# Patient Record
Sex: Female | Born: 1990 | Race: White | Hispanic: No | Marital: Married | Smoking: Never smoker
Health system: Southern US, Community
[De-identification: ages and names within clinical notes are randomized; demographics above are authoritative.]

## PROBLEM LIST (undated history)

## (undated) ENCOUNTER — Inpatient Hospital Stay (HOSPITAL_COMMUNITY): Payer: Self-pay

## (undated) DIAGNOSIS — R42 Dizziness and giddiness: Secondary | ICD-10-CM

## (undated) DIAGNOSIS — F909 Attention-deficit hyperactivity disorder, unspecified type: Secondary | ICD-10-CM

## (undated) DIAGNOSIS — J3089 Other allergic rhinitis: Secondary | ICD-10-CM

## (undated) DIAGNOSIS — J45909 Unspecified asthma, uncomplicated: Secondary | ICD-10-CM

## (undated) DIAGNOSIS — IMO0002 Reserved for concepts with insufficient information to code with codable children: Secondary | ICD-10-CM

## (undated) DIAGNOSIS — Z3490 Encounter for supervision of normal pregnancy, unspecified, unspecified trimester: Secondary | ICD-10-CM

## (undated) DIAGNOSIS — F32A Depression, unspecified: Secondary | ICD-10-CM

## (undated) DIAGNOSIS — R12 Heartburn: Secondary | ICD-10-CM

## (undated) DIAGNOSIS — F329 Major depressive disorder, single episode, unspecified: Secondary | ICD-10-CM

## (undated) DIAGNOSIS — B009 Herpesviral infection, unspecified: Secondary | ICD-10-CM

## (undated) HISTORY — DX: Other allergic rhinitis: J30.89

## (undated) HISTORY — DX: Dizziness and giddiness: R42

## (undated) HISTORY — DX: Attention-deficit hyperactivity disorder, unspecified type: F90.9

## (undated) HISTORY — DX: Heartburn: R12

## (undated) HISTORY — DX: Encounter for supervision of normal pregnancy, unspecified, unspecified trimester: Z34.90

---
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
## 2009-02-02 ENCOUNTER — Inpatient Hospital Stay (HOSPITAL_COMMUNITY): Admission: AD | Admit: 2009-02-02 | Discharge: 2009-02-03 | Payer: Self-pay | Admitting: Obstetrics & Gynecology

## 2009-08-13 ENCOUNTER — Inpatient Hospital Stay (HOSPITAL_COMMUNITY): Admission: AD | Admit: 2009-08-13 | Discharge: 2009-08-13 | Payer: Self-pay | Admitting: Obstetrics & Gynecology

## 2009-08-13 ENCOUNTER — Inpatient Hospital Stay (HOSPITAL_COMMUNITY): Admission: AD | Admit: 2009-08-13 | Discharge: 2009-08-15 | Payer: Self-pay | Admitting: Obstetrics & Gynecology

## 2009-08-13 ENCOUNTER — Ambulatory Visit: Payer: Self-pay | Admitting: Family

## 2009-11-21 HISTORY — PX: MYRINGOTOMY: SHX2060

## 2009-11-21 HISTORY — PX: INTRAUTERINE DEVICE INSERTION: SHX323

## 2010-01-29 ENCOUNTER — Emergency Department (HOSPITAL_COMMUNITY): Admission: EM | Admit: 2010-01-29 | Discharge: 2010-01-29 | Payer: Self-pay | Admitting: Emergency Medicine

## 2010-04-28 ENCOUNTER — Ambulatory Visit (HOSPITAL_COMMUNITY): Admission: RE | Admit: 2010-04-28 | Discharge: 2010-04-28 | Payer: Self-pay | Admitting: Family Medicine

## 2010-08-03 ENCOUNTER — Ambulatory Visit (HOSPITAL_COMMUNITY): Admission: RE | Admit: 2010-08-03 | Discharge: 2010-08-03 | Payer: Self-pay | Admitting: Orthopedic Surgery

## 2010-09-28 ENCOUNTER — Ambulatory Visit (HOSPITAL_COMMUNITY): Admission: RE | Admit: 2010-09-28 | Discharge: 2010-09-28 | Payer: Self-pay | Admitting: Otolaryngology

## 2010-10-30 ENCOUNTER — Emergency Department (HOSPITAL_COMMUNITY)
Admission: EM | Admit: 2010-10-30 | Discharge: 2010-10-30 | Payer: Self-pay | Source: Home / Self Care | Admitting: Emergency Medicine

## 2011-01-31 LAB — URINALYSIS, ROUTINE W REFLEX MICROSCOPIC
Bilirubin Urine: NEGATIVE
Glucose, UA: NEGATIVE mg/dL
Hgb urine dipstick: NEGATIVE
Specific Gravity, Urine: 1.015 (ref 1.005–1.030)
pH: 8.5 — ABNORMAL HIGH (ref 5.0–8.0)

## 2011-02-13 LAB — CBC
Hemoglobin: 15.5 g/dL — ABNORMAL HIGH (ref 12.0–15.0)
MCHC: 34.5 g/dL (ref 30.0–36.0)
Platelets: 251 10*3/uL (ref 150–400)
RBC: 5.3 MIL/uL — ABNORMAL HIGH (ref 3.87–5.11)
RDW: 12.3 % (ref 11.5–15.5)
WBC: 8 10*3/uL (ref 4.0–10.5)

## 2011-02-13 LAB — DIFFERENTIAL
Basophils Absolute: 0 10*3/uL (ref 0.0–0.1)
Basophils Relative: 0 % (ref 0–1)
Eosinophils Relative: 2 % (ref 0–5)
Lymphocytes Relative: 21 % (ref 12–46)
Monocytes Absolute: 0.5 10*3/uL (ref 0.1–1.0)
Neutro Abs: 5.6 10*3/uL (ref 1.7–7.7)

## 2011-02-13 LAB — BASIC METABOLIC PANEL
Chloride: 102 mEq/L (ref 96–112)
Creatinine, Ser: 0.69 mg/dL (ref 0.4–1.2)
GFR calc Af Amer: >60 mL/min (ref >60)
Potassium: 3.4 mEq/L — ABNORMAL LOW (ref 3.5–5.1)
Sodium: 134 mEq/L — ABNORMAL LOW (ref 135–145)

## 2011-02-13 LAB — RAPID URINE DRUG SCREEN, HOSP PERFORMED
Cocaine: NOT DETECTED
Tetrahydrocannabinol: NOT DETECTED

## 2011-02-13 LAB — ETHANOL: Alcohol, Ethyl (B): <5 mg/dL (ref 0–10)

## 2011-02-13 LAB — PREGNANCY, URINE: Preg Test, Ur: NEGATIVE

## 2011-02-22 ENCOUNTER — Emergency Department (HOSPITAL_COMMUNITY)
Admission: EM | Admit: 2011-02-22 | Discharge: 2011-02-23 | Disposition: A | Discharge status: Home / Self Care | Payer: Medicaid Other | Attending: Emergency Medicine | Admitting: Emergency Medicine

## 2011-02-22 DIAGNOSIS — F3289 Other specified depressive episodes: Secondary | ICD-10-CM | POA: Insufficient documentation

## 2011-02-22 DIAGNOSIS — T23219A Burn of second degree of unspecified thumb (nail), initial encounter: Secondary | ICD-10-CM | POA: Insufficient documentation

## 2011-02-22 DIAGNOSIS — X12XXXA Contact with other hot fluids, initial encounter: Secondary | ICD-10-CM | POA: Insufficient documentation

## 2011-02-22 DIAGNOSIS — Z79899 Other long term (current) drug therapy: Secondary | ICD-10-CM | POA: Insufficient documentation

## 2011-02-22 DIAGNOSIS — Y93G1 Activity, food preparation and clean up: Secondary | ICD-10-CM | POA: Insufficient documentation

## 2011-02-22 DIAGNOSIS — Y92009 Unspecified place in unspecified non-institutional (private) residence as the place of occurrence of the external cause: Secondary | ICD-10-CM | POA: Insufficient documentation

## 2011-02-22 DIAGNOSIS — Y998 Other external cause status: Secondary | ICD-10-CM | POA: Insufficient documentation

## 2011-02-22 DIAGNOSIS — T22219A Burn of second degree of unspecified forearm, initial encounter: Secondary | ICD-10-CM | POA: Insufficient documentation

## 2011-02-22 DIAGNOSIS — F329 Major depressive disorder, single episode, unspecified: Secondary | ICD-10-CM | POA: Insufficient documentation

## 2011-02-22 DIAGNOSIS — T310 Burns involving less than 10% of body surface: Secondary | ICD-10-CM | POA: Insufficient documentation

## 2011-02-25 LAB — CBC
HCT: 30.7 % — ABNORMAL LOW (ref 36.0–46.0)
HCT: 36 % (ref 36.0–46.0)
Hemoglobin: 10.4 g/dL — ABNORMAL LOW (ref 12.0–15.0)
Hemoglobin: 12.1 g/dL (ref 12.0–15.0)
MCHC: 33.8 g/dL (ref 30.0–36.0)
MCV: 86.8 fL (ref 78.0–100.0)
MCV: 86.8 fL (ref 78.0–100.0)
RBC: 3.54 MIL/uL — ABNORMAL LOW (ref 3.87–5.11)
RDW: 13.1 % (ref 11.5–15.5)
WBC: 12.1 10*3/uL — ABNORMAL HIGH (ref 4.0–10.5)

## 2011-02-25 LAB — RH IMMUNE GLOB WKUP(>/=20WKS)(NOT WOMEN'S HOSP)

## 2011-03-03 LAB — URINALYSIS, ROUTINE W REFLEX MICROSCOPIC
Bilirubin Urine: NEGATIVE
Nitrite: NEGATIVE
Specific Gravity, Urine: >1.03 — ABNORMAL HIGH (ref 1.005–1.030)
Urobilinogen, UA: 0.2 mg/dL (ref 0.0–1.0)
pH: 5.5 (ref 5.0–8.0)

## 2011-03-07 LAB — CBC
Hemoglobin: 14.9 g/dL (ref 12.0–15.0)
MCHC: 33.5 g/dL (ref 30.0–36.0)
MCHC: 33.7 g/dL (ref 30.0–36.0)
MCV: 89.2 fL (ref 78.0–100.0)
Platelets: 270 10*3/uL (ref 150–400)
RBC: 4.55 MIL/uL (ref 3.87–5.11)
RDW: 12.3 % (ref 11.5–15.5)
RDW: 11.9 % (ref 11.5–15.5)

## 2011-03-07 LAB — PREGNANCY, URINE: Preg Test, Ur: POSITIVE

## 2011-03-07 LAB — RH IMMUNE GLOBULIN WORKUP (NOT WOMEN'S HOSP): Antibody Screen: NEGATIVE

## 2011-03-07 LAB — URINALYSIS, ROUTINE W REFLEX MICROSCOPIC
Glucose, UA: NEGATIVE mg/dL
Ketones, ur: NEGATIVE mg/dL
Protein, ur: NEGATIVE mg/dL
Urobilinogen, UA: 0.2 mg/dL (ref 0.0–1.0)

## 2011-03-07 LAB — HCG, QUANTITATIVE, PREGNANCY
hCG, Beta Chain, Quant, S: 21380 m[IU]/mL — ABNORMAL HIGH (ref <5)
hCG, Beta Chain, Quant, S: 42544 m[IU]/mL — ABNORMAL HIGH (ref <5)

## 2011-03-07 LAB — DIFFERENTIAL
Basophils Absolute: 0.1 10*3/uL (ref 0.0–0.1)
Basophils Relative: 1 % (ref 0–1)
Eosinophils Relative: 2 % (ref 0–5)
Monocytes Absolute: 0.7 10*3/uL (ref 0.1–1.0)
Neutro Abs: 4.5 10*3/uL (ref 1.7–7.7)

## 2011-03-07 LAB — GC/CHLAMYDIA PROBE AMP, GENITAL
Chlamydia, DNA Probe: NEGATIVE
GC Probe Amp, Genital: NEGATIVE

## 2011-03-07 LAB — WET PREP, GENITAL
Trich, Wet Prep: NONE SEEN
Yeast Wet Prep HPF POC: NONE SEEN

## 2012-01-23 ENCOUNTER — Encounter (HOSPITAL_COMMUNITY): Payer: Self-pay | Admitting: Emergency Medicine

## 2012-01-23 ENCOUNTER — Emergency Department (HOSPITAL_COMMUNITY)
Admission: EM | Admit: 2012-01-23 | Discharge: 2012-01-23 | Disposition: A | Discharge status: Home / Self Care | Payer: Medicaid Other | Attending: Emergency Medicine | Admitting: Emergency Medicine

## 2012-01-23 ENCOUNTER — Emergency Department (HOSPITAL_COMMUNITY): Payer: Medicaid Other

## 2012-01-23 DIAGNOSIS — R112 Nausea with vomiting, unspecified: Secondary | ICD-10-CM | POA: Insufficient documentation

## 2012-01-23 DIAGNOSIS — R109 Unspecified abdominal pain: Secondary | ICD-10-CM | POA: Insufficient documentation

## 2012-01-23 LAB — DIFFERENTIAL
Eosinophils Relative: 0 % (ref 0–5)
Lymphocytes Relative: 6 % — ABNORMAL LOW (ref 12–46)
Lymphs Abs: 0.5 10*3/uL — ABNORMAL LOW (ref 0.7–4.0)
Monocytes Absolute: 0.4 10*3/uL (ref 0.1–1.0)

## 2012-01-23 LAB — COMPREHENSIVE METABOLIC PANEL
BUN: 9 mg/dL (ref 6–23)
CO2: 27 mEq/L (ref 19–32)
Calcium: 9.3 mg/dL (ref 8.4–10.5)
Creatinine, Ser: 0.66 mg/dL (ref 0.50–1.10)
GFR calc Af Amer: >90 mL/min (ref >90)
GFR calc non Af Amer: >90 mL/min (ref >90)
Glucose, Bld: 92 mg/dL (ref 70–99)

## 2012-01-23 LAB — URINALYSIS, ROUTINE W REFLEX MICROSCOPIC
Bilirubin Urine: NEGATIVE
Ketones, ur: NEGATIVE mg/dL
Nitrite: NEGATIVE
pH: 6 (ref 5.0–8.0)

## 2012-01-23 LAB — CBC
HCT: 44.2 % (ref 36.0–46.0)
MCV: 85.3 fL (ref 78.0–100.0)
RBC: 5.18 MIL/uL — ABNORMAL HIGH (ref 3.87–5.11)
RDW: 12.1 % (ref 11.5–15.5)
WBC: 8 10*3/uL (ref 4.0–10.5)

## 2012-01-23 MED ORDER — GI COCKTAIL ~~LOC~~
30.0000 mL | Freq: Once | ORAL | Status: AC
  Administered 2012-01-23: 30 mL via ORAL
  Filled 2012-01-23: qty 30

## 2012-01-23 MED ORDER — KETOROLAC TROMETHAMINE 30 MG/ML IJ SOLN
30.0000 mg | Freq: Once | INTRAMUSCULAR | Status: AC
  Administered 2012-01-23: 30 mg via INTRAVENOUS
  Filled 2012-01-23: qty 1

## 2012-01-23 MED ORDER — PROMETHAZINE HCL 25 MG PO TABS: 25.0000 mg | ORAL_TABLET | Freq: Four times a day (QID) | ORAL | Status: DC | PRN

## 2012-01-23 MED ORDER — FAMOTIDINE IN NACL 20-0.9 MG/50ML-% IV SOLN
20.0000 mg | Freq: Once | INTRAVENOUS | Status: AC
  Administered 2012-01-23: 20 mg via INTRAVENOUS
  Filled 2012-01-23: qty 50

## 2012-01-23 MED ORDER — SODIUM CHLORIDE 0.9 % IV BOLUS (SEPSIS)
1000.0000 mL | Freq: Once | INTRAVENOUS | Status: AC
  Administered 2012-01-23: 1000 mL via INTRAVENOUS

## 2012-01-23 MED ORDER — PROMETHAZINE HCL 25 MG/ML IJ SOLN
12.5000 mg | Freq: Once | INTRAMUSCULAR | Status: AC
  Administered 2012-01-23: 25 mg via INTRAVENOUS
  Filled 2012-01-23: qty 1

## 2012-01-23 MED ORDER — DICYCLOMINE HCL 20 MG PO TABS: 20.0000 mg | ORAL_TABLET | Freq: Four times a day (QID) | ORAL | Status: DC | PRN

## 2012-01-23 MED ORDER — ONDANSETRON 8 MG PO TBDP
8.0000 mg | ORAL_TABLET | Freq: Once | ORAL | Status: DC
  Filled 2012-01-23: qty 1

## 2012-01-23 NOTE — ED Provider Notes (Signed)
History     CSN: [id number]  Arrival date & time 01/23/12  1236   First MD Initiated Contact with Patient 01/23/12 1317      Chief Complaint  Patient presents with  . Nausea  . Emesis  . Abdominal Pain     HPI Pt was seen at 1340.  Per pt, c/o gradual onset and persistence of constant upper abd "pain" for the past several years, worse over the past several days.  Describes the pain as "burning."  Has been assoc with multiple intermittent episodes of N/V as well as alternating diarrhea and constipation.  Has not been eval by her PMD or GI for same.  Denies back pain, no fevers, no rash, no CP/SOB, no cough, no black or blood in stools or emesis.       History reviewed. No pertinent past medical history.  History reviewed. No pertinent past surgical history.  History  Substance Use Topics  . Smoking status: Not on file  . Smokeless tobacco: Not on file  . Alcohol Use: Yes    Review of Systems ROS: Statement: All systems negative except as marked or noted in the HPI; Constitutional: Negative for fever and chills. ; ; Eyes: Negative for eye pain, redness and discharge. ; ; ENMT: Negative for ear pain, hoarseness, nasal congestion, sinus pressure and sore throat. ; ; Cardiovascular: Negative for chest pain, palpitations, diaphoresis, dyspnea and peripheral edema. ; ; Respiratory: Negative for cough, wheezing and stridor. ; ; Gastrointestinal: +N/V/D, constipation, abd pain. Negative for blood in stool, hematemesis, jaundice and rectal bleeding. . ; ; Genitourinary: Negative for dysuria, flank pain and hematuria. ; ; Musculoskeletal: Negative for back pain and neck pain. Negative for swelling and trauma.; ; Skin: Negative for pruritus, rash, abrasions, blisters, bruising and skin lesion.; ; Neuro: Negative for headache, lightheadedness and neck stiffness. Negative for weakness, altered level of consciousness , altered mental status, extremity weakness, paresthesias, involuntary movement,  seizure and syncope.     Allergies  Latex  Home Medications  No current outpatient prescriptions on file.  BP 119/78  Pulse 120  Temp(Src) 98.5 F (36.9 C) (Oral)  Resp 20  Ht 5\' 4"  (1.626 m)  Wt 162 lb (73.483 kg)  BMI 27.81 kg/m2  SpO2 100%  LMP 12/05/2011  Physical Exam 1345: Physical examination:  Nursing notes reviewed; Vital signs and O2 SAT reviewed;  Constitutional: Well developed, Well nourished, Well hydrated, In no acute distress; Head:  Normocephalic, atraumatic; Eyes: EOMI, PERRL, No scleral icterus; ENMT: Mouth and pharynx normal, Mucous membranes moist; Neck: Supple, Full range of motion, No lymphadenopathy; Cardiovascular: Regular rate and rhythm, No murmur, rub, or gallop; Respiratory: Breath sounds clear & equal bilaterally, No rales, rhonchi, wheezes, or rub, Normal respiratory effort/excursion; Chest: Nontender, Movement normal; Abdomen: Soft, +mid-epigastric area tender to palp, no rebound or guarding, Nondistended, Normal bowel sounds; Genitourinary: No CVA tenderness; Extremities: Pulses normal, No tenderness, No edema, No calf edema or asymmetry.; Neuro: AA&Ox3, Major CN grossly intact.  No gross focal motor or sensory deficits in extremities.; Skin: Color normal, Warm, Dry, no rash.    ED Course  Procedures   MDM  MDM Reviewed: nursing note and vitals Interpretation: labs and ultrasound     Results for orders placed during the hospital encounter of 01/23/12  URINALYSIS, ROUTINE W REFLEX MICROSCOPIC      Component Value Range   Color, Urine YELLOW  YELLOW    APPearance CLEAR  CLEAR    Specific Gravity, Urine 1.025  1.005 - 1.030    pH 6.0  5.0 - 8.0    Glucose, UA NEGATIVE  NEGATIVE (mg/dL)   Hgb urine dipstick NEGATIVE  NEGATIVE    Bilirubin Urine NEGATIVE  NEGATIVE    Ketones, ur NEGATIVE  NEGATIVE (mg/dL)   Protein, ur NEGATIVE  NEGATIVE (mg/dL)   Urobilinogen, UA 1.0  0.0 - 1.0 (mg/dL)   Nitrite NEGATIVE  NEGATIVE    Leukocytes, UA NEGATIVE   NEGATIVE   POCT PREGNANCY, URINE      Component Value Range   Preg Test, Ur NEGATIVE  NEGATIVE   LIPASE, BLOOD      Component Value Range   Lipase 18  11 - 59 (U/L)  CBC      Component Value Range   WBC 8.0  4.0 - 10.5 (K/uL)   RBC 5.18 (*) 3.87 - 5.11 (MIL/uL)   Hemoglobin 15.4 (*) 12.0 - 15.0 (g/dL)   HCT 62.1  30.8 - 65.7 (%)   MCV 85.3  78.0 - 100.0 (fL)   MCH 29.7  26.0 - 34.0 (pg)   MCHC 34.8  30.0 - 36.0 (g/dL)   RDW 84.6  96.2 - 95.2 (%)   Platelets 201  150 - 400 (K/uL)  DIFFERENTIAL      Component Value Range   Neutrophils Relative 89 (*) 43 - 77 (%)   Neutro Abs 7.1  1.7 - 7.7 (K/uL)   Lymphocytes Relative 6 (*) 12 - 46 (%)   Lymphs Abs 0.5 (*) 0.7 - 4.0 (K/uL)   Monocytes Relative 5  3 - 12 (%)   Monocytes Absolute 0.4  0.1 - 1.0 (K/uL)   Eosinophils Relative 0  0 - 5 (%)   Eosinophils Absolute 0.0  0.0 - 0.7 (K/uL)   Basophils Relative 0  0 - 1 (%)   Basophils Absolute 0.0  0.0 - 0.1 (K/uL)  COMPREHENSIVE METABOLIC PANEL      Component Value Range   Sodium 136  135 - 145 (mEq/L)   Potassium 3.6  3.5 - 5.1 (mEq/L)   Chloride 101  96 - 112 (mEq/L)   CO2 27  19 - 32 (mEq/L)   Glucose, Bld 92  70 - 99 (mg/dL)   BUN 9  6 - 23 (mg/dL)   Creatinine, Ser 8.41  0.50 - 1.10 (mg/dL)   Calcium 9.3  8.4 - 32.4 (mg/dL)   Total Protein 7.4  6.0 - 8.3 (g/dL)   Albumin 4.0  3.5 - 5.2 (g/dL)   AST 15  0 - 37 (U/L)   ALT 11  0 - 35 (U/L)   Alkaline Phosphatase 54  39 - 117 (U/L)   Total Bilirubin 0.8  0.3 - 1.2 (mg/dL)   GFR calc non Af Amer >90  >90 (mL/min)   GFR calc Af Amer >90  >90 (mL/min)    US Abdomen Complete 01/23/2012  *RADIOLOGY REPORT*  Clinical Data: Abdominal pain.  Nausea vomiting.  ABDOMEN ULTRASOUND  Technique:  Complete abdominal ultrasound examination was performed including evaluation of the liver, gallbladder, bile ducts, pancreas, kidneys, spleen, IVC, and abdominal aorta.  Comparison: No comparison studies available.  Findings:  Gallbladder:  There  is no evidence for gallstones.  No gallbladder wall thickening or pericholecystic fluid.  The sonographer reports no sonographic Murphy's sign.  Common Bile Duct:  Measures up to 8 mm in diameter.  Liver:  Normal.  No focal parenchymal abnormality.  No biliary dilation.  IVC:  Normal.  Pancreas:  The pancreatic tail  obscured by overlying bowel gas. Otherwise unremarkable.  Spleen:  Normal.  Right kidney:  9.4 cm in long axis.  Normal.  Left kidney:  10.1 cm in long axis.  Normal.  Abdominal Aorta:  No aneurysm.  IMPRESSION: Mild distention of the extrahepatic common duct without evidence for intrahepatic biliary duct dilatation and a normal appearing gallbladder.  Consider correlation with liver function tests.  Original Report Authenticated By: ERIC A. MANSELL, M.D.     4:52 PM:  LFT's and WBC normal, afebrile with stable VSS. Pt requesting phenergan instead of zofran "because that stuff doesn't work for me."  Has taken a nap, tol PO food and fluids well while in the ED without N/V.  Has not had BM while in ED.  Appears NAD, non-toxic appearing, VSS, resps easy.  Wants to go home now.  Dx testing d/w pt and family.  Questions answered.  Verb understanding, agreeable to d/c home with outpt f/u with PMD and GI MD.          Laray Anger, DO 01/25/12 1545

## 2012-01-23 NOTE — Discharge Instructions (Signed)
RESOURCE GUIDE  Dental Problems  Patients with Medicaid: [facility]                     [facility] [address]                                           [address] Phone:  [phone]                                                  Phone:  [phone]  If unable to pay or uninsured, contact:  [facility] or [facility] to become qualified for the adult dental clinic.  Chronic Pain Problems Contact [facility]  [phone] Patients need to be referred by their primary care doctor.  Insufficient Money for Medicine Contact [employer]:  call "211" or [facility] 271-5999.  No Primary Care Doctor Call [facility]  [phone] Other agencies that provide inexpensive medical care    [facility]  [phone]    [facility]  [phone]    [facility]  [phone]    Women[facility] Clinic  [phone]    [facility]  [phone]    [facility]  [phone]  Psychological Services [facility]  [phone] [facility]  [phone] [facility]   [phone] (emergency services [phone])  Substance Abuse Resources [facility]  336-882-2125 [facility] 336-784-9470 [facility] 336-285-9073 [facility] 336-845-3988 Residential & Outpatient Substance Abuse Program  800-659-3381  Abuse/Neglect [place] Child Abuse Hotline (336) 641-3795 [place] Child Abuse Hotline 800-378-5315 (After Hours)  Emergency Shelter [facility] (336) 271-5985  Maternity Homes [facility] (336) 275-9566 [facility] (704) 372-4663  MRSA Hotline #:   832-7006    [place] [facility]  [facility] [place] [facility]     [employer]                          [facility] [address] [place]                       [address]      [address]  [place]                                                [place]                            [place] Phone:  [phone]                                   Phone:  [phone]                 Phone:  [phone]  [facility] Phone:  [phone]    [facility] Child Abuse Hotline (309)116-9884 (571)290-9461 (After Hours)    Take the prescription as directed.  Increase your fluid intake (ie:  Gatoraide) for the next few days, as discussed.  Eat a bland diet and advance to your regular diet slowly as you can tolerate it.   Avoid full strength juices, as well as milk and milk products if you develop diarrhea, and do not restart these fluids/foods until the diarrhea resolves.   Call your regular medical doctor and the GI doctor tomorrow morning to schedule a follow up appointment this week.  Return to the Emergency Department immediately if not improving (or even worsening) despite taking the medicines as prescribed, any black or bloody stool or vomit, if you develop a fever, or for any other concerns.

## 2012-01-23 NOTE — ED Notes (Signed)
Pt has been drinking sprite w/out nausea

## 2012-01-23 NOTE — ED Notes (Signed)
Pt DC to home with steady gait 

## 2012-01-23 NOTE — ED Notes (Signed)
Pt c/o abd pain with n/v since last night.  

## 2012-01-25 ENCOUNTER — Other Ambulatory Visit: Payer: Self-pay | Admitting: Family Medicine

## 2012-01-25 DIAGNOSIS — R509 Fever, unspecified: Secondary | ICD-10-CM

## 2012-01-25 DIAGNOSIS — R112 Nausea with vomiting, unspecified: Secondary | ICD-10-CM

## 2012-01-26 ENCOUNTER — Encounter (HOSPITAL_COMMUNITY): Payer: Self-pay

## 2012-01-26 ENCOUNTER — Encounter (HOSPITAL_COMMUNITY)
Admission: RE | Admit: 2012-01-26 | Discharge: 2012-01-26 | Disposition: A | Discharge status: Home / Self Care | Payer: Medicaid Other | Source: Ambulatory Visit | Attending: Family Medicine | Admitting: Family Medicine

## 2012-01-26 DIAGNOSIS — R112 Nausea with vomiting, unspecified: Secondary | ICD-10-CM | POA: Insufficient documentation

## 2012-01-26 DIAGNOSIS — R509 Fever, unspecified: Secondary | ICD-10-CM

## 2012-01-26 DIAGNOSIS — K828 Other specified diseases of gallbladder: Secondary | ICD-10-CM | POA: Insufficient documentation

## 2012-01-26 DIAGNOSIS — R109 Unspecified abdominal pain: Secondary | ICD-10-CM | POA: Insufficient documentation

## 2012-01-26 MED ORDER — TECHNETIUM TC 99M MEBROFENIN IV KIT
5.0000 | PACK | Freq: Once | INTRAVENOUS | Status: AC | PRN
  Administered 2012-01-26: 5 via INTRAVENOUS

## 2012-01-26 MED ORDER — SINCALIDE 5 MCG IJ SOLR
0.0200 ug/kg | Freq: Once | INTRAMUSCULAR | Status: AC
  Administered 2012-01-26: 1.64 ug via INTRAVENOUS

## 2012-02-04 ENCOUNTER — Encounter (HOSPITAL_COMMUNITY): Payer: Self-pay | Admitting: *Deleted

## 2012-02-04 ENCOUNTER — Emergency Department (HOSPITAL_COMMUNITY)
Admission: EM | Admit: 2012-02-04 | Discharge: 2012-02-04 | Disposition: A | Discharge status: Home / Self Care | Payer: Medicaid Other | Attending: Emergency Medicine | Admitting: Emergency Medicine

## 2012-02-04 DIAGNOSIS — R1013 Epigastric pain: Secondary | ICD-10-CM | POA: Insufficient documentation

## 2012-02-04 DIAGNOSIS — K828 Other specified diseases of gallbladder: Secondary | ICD-10-CM | POA: Insufficient documentation

## 2012-02-04 DIAGNOSIS — F329 Major depressive disorder, single episode, unspecified: Secondary | ICD-10-CM | POA: Insufficient documentation

## 2012-02-04 DIAGNOSIS — R1110 Vomiting, unspecified: Secondary | ICD-10-CM | POA: Insufficient documentation

## 2012-02-04 DIAGNOSIS — F3289 Other specified depressive episodes: Secondary | ICD-10-CM | POA: Insufficient documentation

## 2012-02-04 HISTORY — DX: Reserved for concepts with insufficient information to code with codable children: IMO0002

## 2012-02-04 HISTORY — DX: Depression, unspecified: F32.A

## 2012-02-04 HISTORY — DX: Major depressive disorder, single episode, unspecified: F32.9

## 2012-02-04 LAB — CBC
Hemoglobin: 14 g/dL (ref 12.0–15.0)
RBC: 4.74 MIL/uL (ref 3.87–5.11)

## 2012-02-04 LAB — COMPREHENSIVE METABOLIC PANEL
ALT: 11 U/L (ref 0–35)
Albumin: 4 g/dL (ref 3.5–5.2)
Alkaline Phosphatase: 52 U/L (ref 39–117)
BUN: 7 mg/dL (ref 6–23)
Chloride: 104 mEq/L (ref 96–112)
Potassium: 3.3 mEq/L — ABNORMAL LOW (ref 3.5–5.1)
Sodium: 139 mEq/L (ref 135–145)
Total Bilirubin: 0.6 mg/dL (ref 0.3–1.2)

## 2012-02-04 LAB — DIFFERENTIAL
Basophils Relative: 0 % (ref 0–1)
Lymphs Abs: 2.3 10*3/uL (ref 0.7–4.0)
Monocytes Relative: 8 % (ref 3–12)
Neutro Abs: 4.4 10*3/uL (ref 1.7–7.7)
Neutrophils Relative %: 59 % (ref 43–77)

## 2012-02-04 MED ORDER — GI COCKTAIL ~~LOC~~
30.0000 mL | Freq: Once | ORAL | Status: AC
  Administered 2012-02-04: 30 mL via ORAL
  Filled 2012-02-04: qty 30

## 2012-02-04 MED ORDER — ONDANSETRON HCL 4 MG/2ML IJ SOLN
4.0000 mg | Freq: Once | INTRAMUSCULAR | Status: AC
  Administered 2012-02-04: 4 mg via INTRAVENOUS
  Filled 2012-02-04: qty 2

## 2012-02-04 MED ORDER — GI COCKTAIL ~~LOC~~: 30.0000 mL | Freq: Three times a day (TID) | ORAL | Status: DC

## 2012-02-04 MED ORDER — SODIUM CHLORIDE 0.9 % IV BOLUS (SEPSIS)
1000.0000 mL | Freq: Once | INTRAVENOUS | Status: AC
  Administered 2012-02-04: 1000 mL via INTRAVENOUS

## 2012-02-04 NOTE — ED Notes (Signed)
Pt states abdominal pain. Unable to eat x 1 week. Scheduled to see surgeon to have gall bladder removed. Vomiting at times. NAD at this time.

## 2012-02-04 NOTE — ED Notes (Signed)
IV infused - feeling better

## 2012-02-04 NOTE — ED Provider Notes (Signed)
History   Scribed for Gerhard Munch, MD, the patient was seen in [facility]. The chart was scribed by Gilman Schmidt. The patients care was started at 7:18 PM.   CSN: [id number]  Arrival date & time 02/04/12  1440   First MD Initiated Contact with Patient 02/04/12 1906      Chief Complaint  Patient presents with  . Abdominal Pain    (Consider location/radiation/quality/duration/timing/severity/associated sxs/prior treatment) HPI Jasmine Maynard is a 21 y.o. female who presents to the Emergency Department complaining of abdominal pain. States she has been unable to eat for one week due to stabbing pain.Notes that ab pain is constant but is exacerbated upon eating. Pt is scheduled to see surgeon to have gallbladder removed on Tuesday. Also notes emesis. Pt was seen prior in ED for same symptoms. There are no other associated symptoms and no other alleviating or aggravating factors.   Surgeon: Dr. Marliss Coots  PCP: Dr. Gerda Diss    Past Medical History  Diagnosis Date  . DDD (degenerative disc disease)   . Depression     History reviewed. No pertinent past surgical history.  No family history on file.  History  Substance Use Topics  . Smoking status: Never Smoker   . Smokeless tobacco: Not on file  . Alcohol Use: Yes     OCC    OB History    Grav Para Term Preterm Abortions TAB SAB Ect Mult Living                  Review of Systems  Gastrointestinal: Positive for nausea, vomiting and abdominal pain.  All other systems reviewed and are negative.    Allergies  Latex  Home Medications   Current Outpatient Rx  Name Route Sig Dispense Refill  . DICYCLOMINE HCL 20 MG PO TABS Oral Take 1 tablet (20 mg total) by mouth every 6 (six) hours as needed (abdominal cramping). 15 tablet 0    BP 155/79  Pulse 75  Temp(Src) 98.4 F (36.9 C) (Oral)  Resp 18  Ht 5\' 4"  (1.626 m)  Wt 174 lb (78.926 kg)  BMI 29.87 kg/m2  SpO2 100%  LMP 02/04/2012  Physical Exam    Constitutional: She appears well-developed and well-nourished.  HENT:  Head: Normocephalic and atraumatic.  Eyes: Conjunctivae are normal. Pupils are equal, round, and reactive to light.  Neck: Neck supple. No tracheal deviation present. No thyromegaly present.  Cardiovascular: Normal rate and regular rhythm.   No murmur heard. Pulmonary/Chest: Effort normal and breath sounds normal.  Abdominal: Soft. Bowel sounds are normal. She exhibits no distension. There is tenderness in the epigastric area. There is no rebound and no guarding.  Musculoskeletal: Normal range of motion. She exhibits no edema and no tenderness.  Neurological: She is alert. Coordination normal.  Skin: Skin is warm and dry. No rash noted.  Psychiatric: She has a normal mood and affect.    ED Course  Procedures (including critical care time)  Labs Reviewed - No data to display No results found.   No diagnosis found.  DIAGNOSTIC STUDIES: Oxygen Saturation is 100% on room air, normal by my interpretation.    LABS Results for orders placed during the hospital encounter of 02/04/12  CBC      Component Value Range   WBC 7.4  4.0 - 10.5 (K/uL)   RBC 4.74  3.87 - 5.11 (MIL/uL)   Hemoglobin 14.0  12.0 - 15.0 (g/dL)   HCT 40.9  81.1 - 91.4 (%)  MCV 85.4  78.0 - 100.0 (fL)   MCH 29.5  26.0 - 34.0 (pg)   MCHC 34.6  30.0 - 36.0 (g/dL)   RDW 16.1  09.6 - 04.5 (%)   Platelets 243  150 - 400 (K/uL)  DIFFERENTIAL      Component Value Range   Neutrophils Relative 59  43 - 77 (%)   Neutro Abs 4.4  1.7 - 7.7 (K/uL)   Lymphocytes Relative 31  12 - 46 (%)   Lymphs Abs 2.3  0.7 - 4.0 (K/uL)   Monocytes Relative 8  3 - 12 (%)   Monocytes Absolute 0.6  0.1 - 1.0 (K/uL)   Eosinophils Relative 1  0 - 5 (%)   Eosinophils Absolute 0.1  0.0 - 0.7 (K/uL)   Basophils Relative 0  0 - 1 (%)   Basophils Absolute 0.0  0.0 - 0.1 (K/uL)  COMPREHENSIVE METABOLIC PANEL      Component Value Range   Sodium 139  135 - 145 (mEq/L)    Potassium 3.3 (*) 3.5 - 5.1 (mEq/L)   Chloride 104  96 - 112 (mEq/L)   CO2 25  19 - 32 (mEq/L)   Glucose, Bld 84  70 - 99 (mg/dL)   BUN 7  6 - 23 (mg/dL)   Creatinine, Ser 4.09  0.50 - 1.10 (mg/dL)   Calcium 9.4  8.4 - 81.1 (mg/dL)   Total Protein 7.2  6.0 - 8.3 (g/dL)   Albumin 4.0  3.5 - 5.2 (g/dL)   AST 11  0 - 37 (U/L)   ALT 11  0 - 35 (U/L)   Alkaline Phosphatase 52  39 - 117 (U/L)   Total Bilirubin 0.6  0.3 - 1.2 (mg/dL)   GFR calc non Af Amer >90  >90 (mL/min)   GFR calc Af Amer >90  >90 (mL/min)  LIPASE, BLOOD      Component Value Range   Lipase 30  11 - 59 (U/L)    COORDINATION OF CARE: 7:18pm:  - Patient evaluated by ED physician,     MDM  I personally performed the services described in this documentation, which was scribed in my presence. The recorded information has been reviewed and considered.  This young female with biliary dyskinesia are present with one week of anorexia.  On exam the patient is in no distress with unremarkable vital signs.  The patient is mild epigastric discomfort.  The patient's labs are reassuring for the absence of acute disease.  The patient had symptomatic relief with fluids, GI cocktail.  The patient was discharged in stable condition to follow up with her surgeon, as previously scheduled, in 3 days.       Gerhard Munch, MD 02/04/12 2052

## 2012-02-04 NOTE — Discharge Instructions (Signed)
Please return to the emergency department for any concerning changes in your condition, such as fever, intractable vomiting, confusion, or anything else that is new and unusual.

## 2012-02-04 NOTE — ED Notes (Signed)
Iv  

## 2012-02-09 ENCOUNTER — Encounter (HOSPITAL_COMMUNITY)
Admission: RE | Admit: 2012-02-09 | Discharge: 2012-02-09 | Disposition: A | Discharge status: Home / Self Care | Payer: Medicaid Other | Source: Ambulatory Visit | Attending: General Surgery | Admitting: General Surgery

## 2012-02-09 ENCOUNTER — Encounter (HOSPITAL_COMMUNITY): Payer: Self-pay

## 2012-02-09 ENCOUNTER — Encounter (HOSPITAL_COMMUNITY): Payer: Self-pay | Admitting: Pharmacy Technician

## 2012-02-09 LAB — BASIC METABOLIC PANEL
CO2: 26 mEq/L (ref 19–32)
Chloride: 102 mEq/L (ref 96–112)
Glucose, Bld: 93 mg/dL (ref 70–99)
Potassium: 3.8 mEq/L (ref 3.5–5.1)
Sodium: 137 mEq/L (ref 135–145)

## 2012-02-09 LAB — CBC
Hemoglobin: 14.7 g/dL (ref 12.0–15.0)
MCH: 29.1 pg (ref 26.0–34.0)
MCV: 85.9 fL (ref 78.0–100.0)
RBC: 5.05 MIL/uL (ref 3.87–5.11)

## 2012-02-09 LAB — SURGICAL PCR SCREEN: Staphylococcus aureus: NEGATIVE

## 2012-02-09 LAB — DIFFERENTIAL
Eosinophils Absolute: 0.2 10*3/uL (ref 0.0–0.7)
Lymphs Abs: 1.6 10*3/uL (ref 0.7–4.0)
Monocytes Relative: 8 % (ref 3–12)
Neutrophils Relative %: 69 % (ref 43–77)

## 2012-02-09 NOTE — Patient Instructions (Signed)
Jasmine Maynard  02/09/2012   Your procedure is scheduled on:  Monday, 02/13/12  Report to [facility] at 0830 AM.  Call this number if you have problems the morning of surgery: [phone]   Remember:   Do not eat food:After Midnight.  May have clear liquids:until Midnight .  Clear liquids include soda, tea, black coffee, apple or grape juice, broth.  Take these medicines the morning of surgery with A SIP OF WATER: omeprazole   Do not wear jewelry, make-up or nail polish.  Do not wear lotions, powders, or perfumes. You may wear deodorant.  Do not shave 48 hours prior to surgery.  Do not bring valuables to the hospital.  Contacts, dentures or bridgework may not be worn into surgery.  Leave suitcase in the car. After surgery it may be brought to your room.  For patients admitted to the hospital, checkout time is 11:00 AM the day of discharge.   Patients discharged the day of surgery will not be allowed to drive home.  Name and phone number of your driver: driver  Special Instructions: CHG Shower Use Special Wash: 1/2 bottle night before surgery and 1/2 bottle morning of surgery.   Please read over the following fact sheets that you were given: Pain Booklet, Coughing and Deep Breathing, MRSA Information, Surgical Site Infection Prevention, Anesthesia Post-op Instructions and Care and Recovery After Surgery   PATIENT INSTRUCTIONS POST-ANESTHESIA  IMMEDIATELY FOLLOWING SURGERY:  Do not drive or operate machinery for the first twenty four hours after surgery.  Do not make any important decisions for twenty four hours after surgery or while taking narcotic pain medications or sedatives.  If you develop intractable nausea and vomiting or a severe headache please notify your doctor immediately.  FOLLOW-UP:  Please make an appointment with your surgeon as instructed. You do not need to follow up with anesthesia unless specifically instructed to do so.  WOUND CARE INSTRUCTIONS (if applicable):   Keep a dry clean dressing on the anesthesia/puncture wound site if there is drainage.  Once the wound has quit draining you may leave it open to air.  Generally you should leave the bandage intact for twenty four hours unless there is drainage.  If the epidural site drains for more than 36-48 hours please call the anesthesia department.  QUESTIONS?:  Please feel free to call your physician or the hospital operator if you have any questions, and they will be happy to assist you.        Laparoscopic Cholecystectomy Care After Refer to this sheet in the next few weeks. These instructions provide you with information on caring for yourself after your procedure. Your caregiver may also give you more specific instructions. Your treatment has been planned according to current medical practices, but problems sometimes occur. Call your caregiver if you have any problems or questions after your procedure. HOME CARE INSTRUCTIONS   Change bandages (dressings) as directed by your caregiver.   Keep the wound dry and clean. The wound may be washed gently with soap and water. Gently blot or dab the area dry.   Do not take baths or use swimming pools or hot tubs for 10 days, or as instructed by your caregiver.   Only take over-the-counter or prescription medicines for pain, discomfort, or fever as directed by your caregiver.   Continue your normal diet as directed by your caregiver.   Do not lift anything heavier than 25 pounds (11.5 kg), or as directed by your caregiver.  Do not play contact sports for 1 week, or as directed by your caregiver.  SEEK MEDICAL CARE IF:   There is redness, swelling, or increasing pain in the wound.   You notice yellowish-white fluid (pus) coming from the wound.   There is drainage from the wound that lasts longer than 1 day.   There is a bad smell coming from the wound or dressing.   The surgical cut (incision) breaks open.  SEEK IMMEDIATE MEDICAL CARE IF:   You  develop a rash.   You have difficulty breathing.   You develop chest pain.   You develop any reaction or side effects to medicines given.   You have a fever.   You have increasing pain in the shoulders (shoulder strap areas).   You have dizzy episodes or faint while standing.   You develop severe abdominal pain.   You feel sick to your stomach (nauseous) or throw up (vomit) and this lasts for more than 1 day.  MAKE SURE YOU:   Understand these instructions.   Will watch your condition.   Will get help right away if you are not doing well or get worse.  Document Released: 11/07/2005 Document Revised: 10/27/2011 Document Reviewed: 04/22/2011 [facility] Patient Information 2012 [place], [place].

## 2012-02-12 NOTE — H&P (Signed)
  NTS SOAP Note  Vital Signs:  Vitals as of: 02/07/2012: Systolic 131: Diastolic 70: Heart Rate 64: Temp 14F: Height 56ft 4in: Weight 177Lbs 0 Ounces: OFC 0in: Respiratory Rate 0: O2 Saturation 0: Pain Level 0: BMI 30  BMI : 30.38 kg/m2  Subjective: This 21 Years 12 Months old Female presents for of Epigastric pain.  Pain has been intermittant over the last 2 years.  Assoc. nausea and emesis.  No fever or chills.  Pain started after her pregnancy.  No jaundice.  + family history.  Patient had HIDA scan with exacerbation of symtoms.  + bloating.    Review of Symptoms:  Constitutional:unremarkable Head:unremarkable Eyes:unremarkable Nose/Mouth/Throat:unremarkable Cardiovascular:unremarkable Respiratory:unremarkable as per HPI Genitourinary:unremarkable Musculoskeletal:unremarkable Skin:unremarkable Breast:unremarkable Hematolgic/Lymphatic:unremarkable Allergic/Immunologic:unremarkable   Past Medical History:Obtained   Past Medical History  Pregnancy Gravida: 1 Pregnancy Para: 1 Surgical History: none Medical Problems: GERD Psychiatric History: none Allergies: LATEX Medications: omeprazol   Social History:Obtained   Social History  Age: 21 Years 0 Months Marital Status:  S Alcohol: occassional Recreational drug(s): none   Smoking Status: Never smoker reviewed on 02/10/2012  Family History:Obtained   Family History  Is there a family history of:CAD, DM, Cancer    Objective Information: General:Well appearing, well nourished in no distress. Skin:no rash or prominent lesions Head:Atraumatic; no masses; no abnormalities Eyes:conjunctiva clear, EOM intact, PERRL Mouth:Mucous membranes moist, no mucosal lesions. Neck:Supple without lymphadenopathy.  Heart:RRR, no murmur Lungs:CTA bilaterally, no wheezes, rhonchi, rales.  Breathing unlabored. Abdomen:Soft, NT/ND, no HSM, no  masses. Extremities:No deformities, clubbing, cyanosis, or edema.     HIDA;  17% EF with symptoms.  Assessment:  Diagnosis &amp; Procedure: DiagnosisCode: 575.8, ProcedureCode: 40981,    Plan: Biliary dyskinesia.  Options discussed.  Patient will schedule l/s chole at her convenience.  Patient will call with issues.  Patient Education:Alternative treatments to surgery were discussed with patient (and family).Risks and benefits  of procedure were fully explained to the patient (and family) who gave informed consent. Patient/family questions were addressed.  Follow-up:Pending Surgery                                             Active Diagnosis and Procedures: 575.8 Other specified disorders of gallbladder   [phone] - OFFICE OUTPATIENT NEW 30 MINUTES        This note has been electronically signed by Fabio Bering MD 02/10/2012 12:47 AM

## 2012-02-13 ENCOUNTER — Encounter (HOSPITAL_COMMUNITY)
Admission: RE | Disposition: A | Discharge status: Home / Self Care | Payer: Self-pay | Source: Ambulatory Visit | Attending: General Surgery

## 2012-02-13 ENCOUNTER — Encounter (HOSPITAL_COMMUNITY): Payer: Self-pay | Admitting: Anesthesiology

## 2012-02-13 ENCOUNTER — Encounter (HOSPITAL_COMMUNITY): Payer: Self-pay | Admitting: *Deleted

## 2012-02-13 ENCOUNTER — Ambulatory Visit (HOSPITAL_COMMUNITY): Payer: Medicaid Other | Admitting: Anesthesiology

## 2012-02-13 ENCOUNTER — Ambulatory Visit (HOSPITAL_COMMUNITY)
Admission: RE | Admit: 2012-02-13 | Discharge: 2012-02-13 | Disposition: A | Discharge status: Home / Self Care | Payer: Medicaid Other | Source: Ambulatory Visit | Attending: General Surgery | Admitting: General Surgery

## 2012-02-13 DIAGNOSIS — K828 Other specified diseases of gallbladder: Secondary | ICD-10-CM | POA: Insufficient documentation

## 2012-02-13 HISTORY — PX: CHOLECYSTECTOMY: SHX55

## 2012-02-13 SURGERY — LAPAROSCOPIC CHOLECYSTECTOMY
Anesthesia: General | Site: Abdomen | Wound class: Contaminated

## 2012-02-13 MED ORDER — MIDAZOLAM HCL 2 MG/2ML IJ SOLN
INTRAMUSCULAR | Status: AC
  Filled 2012-02-13: qty 2

## 2012-02-13 MED ORDER — CEFAZOLIN SODIUM 1-5 GM-% IV SOLN
INTRAVENOUS | Status: AC
  Filled 2012-02-13: qty 50

## 2012-02-13 MED ORDER — MIDAZOLAM HCL 2 MG/2ML IJ SOLN
1.0000 mg | INTRAMUSCULAR | Status: AC | PRN
  Administered 2012-02-13 (×3): 2 mg via INTRAVENOUS

## 2012-02-13 MED ORDER — LACTATED RINGERS IV SOLN
INTRAVENOUS | Status: DC
  Administered 2012-02-13 (×2): via INTRAVENOUS

## 2012-02-13 MED ORDER — HYDROCODONE-ACETAMINOPHEN 5-325 MG PO TABS: 1.0000 | ORAL_TABLET | ORAL | Status: AC | PRN

## 2012-02-13 MED ORDER — ONDANSETRON HCL 4 MG/2ML IJ SOLN
INTRAMUSCULAR | Status: AC
  Filled 2012-02-13: qty 2

## 2012-02-13 MED ORDER — HYDROMORPHONE HCL PF 1 MG/ML IJ SOLN
INTRAMUSCULAR | Status: AC
  Administered 2012-02-13: 0.5 mg via INTRAVENOUS
  Filled 2012-02-13: qty 1

## 2012-02-13 MED ORDER — ROCURONIUM BROMIDE 50 MG/5ML IV SOLN
INTRAVENOUS | Status: AC
  Filled 2012-02-13: qty 1

## 2012-02-13 MED ORDER — LIDOCAINE HCL 1 % IJ SOLN
INTRAMUSCULAR | Status: DC | PRN
  Administered 2012-02-13: 50 mg via INTRADERMAL

## 2012-02-13 MED ORDER — FENTANYL CITRATE 0.05 MG/ML IJ SOLN
INTRAMUSCULAR | Status: AC
  Filled 2012-02-13: qty 5

## 2012-02-13 MED ORDER — ROCURONIUM BROMIDE 100 MG/10ML IV SOLN
INTRAVENOUS | Status: DC | PRN
  Administered 2012-02-13: 30 mg via INTRAVENOUS

## 2012-02-13 MED ORDER — CELECOXIB 100 MG PO CAPS
400.0000 mg | ORAL_CAPSULE | Freq: Every day | ORAL | Status: AC
  Administered 2012-02-13: 400 mg via ORAL

## 2012-02-13 MED ORDER — DEXAMETHASONE SODIUM PHOSPHATE 4 MG/ML IJ SOLN
INTRAMUSCULAR | Status: AC
  Filled 2012-02-13: qty 1

## 2012-02-13 MED ORDER — PROPOFOL 10 MG/ML IV EMUL
INTRAVENOUS | Status: AC
  Filled 2012-02-13: qty 20

## 2012-02-13 MED ORDER — ENOXAPARIN SODIUM 40 MG/0.4ML ~~LOC~~ SOLN
SUBCUTANEOUS | Status: AC
  Filled 2012-02-13: qty 0.4

## 2012-02-13 MED ORDER — ONDANSETRON HCL 4 MG/2ML IJ SOLN
INTRAMUSCULAR | Status: AC
  Administered 2012-02-13: 4 mg via INTRAVENOUS
  Filled 2012-02-13: qty 2

## 2012-02-13 MED ORDER — ONDANSETRON HCL 4 MG/2ML IJ SOLN
4.0000 mg | Freq: Once | INTRAMUSCULAR | Status: AC | PRN
  Administered 2012-02-13: 4 mg via INTRAVENOUS

## 2012-02-13 MED ORDER — DEXAMETHASONE SODIUM PHOSPHATE 4 MG/ML IJ SOLN
4.0000 mg | Freq: Once | INTRAMUSCULAR | Status: AC
  Administered 2012-02-13: 4 mg via INTRAVENOUS

## 2012-02-13 MED ORDER — GLYCOPYRROLATE 0.2 MG/ML IJ SOLN
INTRAMUSCULAR | Status: DC | PRN
  Administered 2012-02-13: .5 mg via INTRAVENOUS

## 2012-02-13 MED ORDER — PROPOFOL 10 MG/ML IV BOLUS
INTRAVENOUS | Status: DC | PRN
  Administered 2012-02-13: 200 mg via INTRAVENOUS
  Administered 2012-02-13: 100 mg via INTRAVENOUS

## 2012-02-13 MED ORDER — NEOSTIGMINE METHYLSULFATE 1 MG/ML IJ SOLN
INTRAMUSCULAR | Status: DC | PRN
  Administered 2012-02-13: 3 mg via INTRAVENOUS

## 2012-02-13 MED ORDER — CEFAZOLIN SODIUM 1-5 GM-% IV SOLN
INTRAVENOUS | Status: DC | PRN
  Administered 2012-02-13: 1 g via INTRAVENOUS

## 2012-02-13 MED ORDER — HYDROMORPHONE HCL PF 1 MG/ML IJ SOLN
INTRAMUSCULAR | Status: AC
  Filled 2012-02-13: qty 1

## 2012-02-13 MED ORDER — SODIUM CHLORIDE 0.9 % IR SOLN
Status: DC | PRN
  Administered 2012-02-13: 1000 mL

## 2012-02-13 MED ORDER — BUPIVACAINE HCL (PF) 0.5 % IJ SOLN
INTRAMUSCULAR | Status: AC
  Filled 2012-02-13: qty 30

## 2012-02-13 MED ORDER — ENOXAPARIN SODIUM 40 MG/0.4ML ~~LOC~~ SOLN
40.0000 mg | Freq: Once | SUBCUTANEOUS | Status: AC
  Administered 2012-02-13: 40 mg via SUBCUTANEOUS

## 2012-02-13 MED ORDER — PROMETHAZINE HCL 25 MG/ML IJ SOLN
12.5000 mg | Freq: Once | INTRAMUSCULAR | Status: AC
  Administered 2012-02-13: 12.5 mg via INTRAVENOUS

## 2012-02-13 MED ORDER — FENTANYL CITRATE 0.05 MG/ML IJ SOLN
INTRAMUSCULAR | Status: AC
  Filled 2012-02-13: qty 2

## 2012-02-13 MED ORDER — CEFAZOLIN SODIUM 1-5 GM-% IV SOLN: 1.0000 g | INTRAVENOUS | Status: DC

## 2012-02-13 MED ORDER — FENTANYL CITRATE 0.05 MG/ML IJ SOLN
INTRAMUSCULAR | Status: DC | PRN
  Administered 2012-02-13 (×4): 50 ug via INTRAVENOUS
  Administered 2012-02-13: 100 ug via INTRAVENOUS
  Administered 2012-02-13: 50 ug via INTRAVENOUS
  Administered 2012-02-13: 100 ug via INTRAVENOUS

## 2012-02-13 MED ORDER — MIDAZOLAM BOLUS VIA INFUSION
1.0000 mg | INTRAVENOUS | Status: AC | PRN
  Administered 2012-02-13 (×2): 1 mg via INTRAVENOUS
  Filled 2012-02-13: qty 1

## 2012-02-13 MED ORDER — CELECOXIB 100 MG PO CAPS
ORAL_CAPSULE | ORAL | Status: AC
  Administered 2012-02-13: 400 mg via ORAL
  Filled 2012-02-13: qty 4

## 2012-02-13 MED ORDER — GLYCOPYRROLATE 0.2 MG/ML IJ SOLN
INTRAMUSCULAR | Status: AC
  Filled 2012-02-13: qty 2

## 2012-02-13 MED ORDER — BUPIVACAINE HCL (PF) 0.5 % IJ SOLN
INTRAMUSCULAR | Status: DC | PRN
  Administered 2012-02-13: 10 mL

## 2012-02-13 MED ORDER — NEOSTIGMINE METHYLSULFATE 1 MG/ML IJ SOLN
INTRAMUSCULAR | Status: AC
  Filled 2012-02-13: qty 10

## 2012-02-13 MED ORDER — SODIUM CHLORIDE 0.9 % IJ SOLN
INTRAMUSCULAR | Status: AC
  Filled 2012-02-13: qty 10

## 2012-02-13 MED ORDER — PROMETHAZINE HCL 25 MG/ML IJ SOLN
INTRAMUSCULAR | Status: AC
  Administered 2012-02-13: 12.5 mg via INTRAVENOUS
  Filled 2012-02-13: qty 1

## 2012-02-13 MED ORDER — LIDOCAINE HCL (PF) 1 % IJ SOLN
INTRAMUSCULAR | Status: AC
  Filled 2012-02-13: qty 5

## 2012-02-13 MED ORDER — ONDANSETRON HCL 4 MG/2ML IJ SOLN
4.0000 mg | Freq: Once | INTRAMUSCULAR | Status: AC
  Administered 2012-02-13: 4 mg via INTRAVENOUS

## 2012-02-13 MED ORDER — HYDROMORPHONE HCL PF 1 MG/ML IJ SOLN
0.5000 mg | INTRAMUSCULAR | Status: DC | PRN
  Administered 2012-02-13 (×6): 0.5 mg via INTRAVENOUS

## 2012-02-13 MED ORDER — FENTANYL CITRATE 0.05 MG/ML IJ SOLN: 25.0000 ug | INTRAMUSCULAR | Status: DC | PRN

## 2012-02-13 SURGICAL SUPPLY — 35 items
APPLIER CLIP UNV 5X34 EPIX (ENDOMECHANICALS) ×2 IMPLANT
BAG HAMPER (MISCELLANEOUS) ×2 IMPLANT
BENZOIN TINCTURE PRP APPL 2/3 (GAUZE/BANDAGES/DRESSINGS) ×2 IMPLANT
CLOTH BEACON ORANGE TIMEOUT ST (SAFETY) ×2 IMPLANT
COVER SURGICAL LIGHT HANDLE (MISCELLANEOUS) ×4 IMPLANT
DECANTER SPIKE VIAL GLASS SM (MISCELLANEOUS) ×2 IMPLANT
DEVICE TROCAR PUNCTURE CLOSURE (ENDOMECHANICALS) ×2 IMPLANT
DURAPREP 26ML APPLICATOR (WOUND CARE) ×2 IMPLANT
ELECT REM PT RETURN 9FT ADLT (ELECTROSURGICAL) ×2
ELECTRODE REM PT RTRN 9FT ADLT (ELECTROSURGICAL) ×1 IMPLANT
FILTER SMOKE EVAC LAPAROSHD (FILTER) ×2 IMPLANT
FORMALIN 10 PREFIL 120ML (MISCELLANEOUS) ×2 IMPLANT
GLOVE BIOGEL PI IND STRL 7.5 (GLOVE) ×1 IMPLANT
GLOVE BIOGEL PI INDICATOR 7.5 (GLOVE) ×1
GLOVE ECLIPSE 7.0 STRL STRAW (GLOVE) ×4 IMPLANT
GLOVE EXAM NITRILE MD LF STRL (GLOVE) ×2 IMPLANT
GLOVE INDICATOR 7.0 STRL GRN (GLOVE) ×4 IMPLANT
GLOVE SS BIOGEL STRL SZ 6.5 (GLOVE) ×1 IMPLANT
GLOVE SUPERSENSE BIOGEL SZ 6.5 (GLOVE) ×1
GOWN STRL REIN XL XLG (GOWN DISPOSABLE) ×6 IMPLANT
HEMOSTAT SNOW SURGICEL 2X4 (HEMOSTASIS) ×2 IMPLANT
INST SET LAPROSCOPIC AP (KITS) ×2 IMPLANT
IV NS IRRIG 3000ML ARTHROMATIC (IV SOLUTION) IMPLANT
KIT ROOM TURNOVER APOR (KITS) ×2 IMPLANT
KIT TROCAR LAP CHOLE (TROCAR) ×2 IMPLANT
MANIFOLD NEPTUNE II (INSTRUMENTS) ×2 IMPLANT
PACK LAP CHOLE LZT030E (CUSTOM PROCEDURE TRAY) ×2 IMPLANT
PAD ARMBOARD 7.5X6 YLW CONV (MISCELLANEOUS) ×2 IMPLANT
POUCH SPECIMEN RETRIEVAL 10MM (ENDOMECHANICALS) ×2 IMPLANT
SET BASIN LINEN APH (SET/KITS/TRAYS/PACK) ×2 IMPLANT
SET TUBE IRRIG SUCTION NO TIP (IRRIGATION / IRRIGATOR) ×2 IMPLANT
STRIP CLOSURE SKIN 1/2X4 (GAUZE/BANDAGES/DRESSINGS) ×2 IMPLANT
SUT MNCRL AB 4-0 PS2 18 (SUTURE) ×4 IMPLANT
SUT VIC AB 2-0 CT2 27 (SUTURE) ×4 IMPLANT
WARMER LAPAROSCOPE (MISCELLANEOUS) ×2 IMPLANT

## 2012-02-13 NOTE — Progress Notes (Signed)
Resting quietly. Resp adequate/nonlabored.  Sleeping at intervals.

## 2012-02-13 NOTE — Interval H&P Note (Signed)
History and Physical Interval Note:  02/13/2012 9:53 AM  Jasmine Maynard  has presented today for surgery, with the diagnosis of Biliary dyskinesia   The various methods of treatment have been discussed with the patient and family. After consideration of risks, benefits and other options for treatment, the patient has consented to  Procedure(s) (LRB): LAPAROSCOPIC CHOLECYSTECTOMY (N/A) as a surgical intervention .  The patients' history has been reviewed, patient examined, no change in status, stable for surgery.  I have reviewed the patients' chart and labs.  Questions were answered to the patient's satisfaction.     Carle Dargan C

## 2012-02-13 NOTE — Progress Notes (Signed)
Awake. Crying at intervals. Continue C/O postop abd pain. Dr Jayme Cloud at bedside to check pt. Order given.

## 2012-02-13 NOTE — Transfer of Care (Signed)
Immediate Anesthesia Transfer of Care Note  Patient: Jasmine Maynard  Procedure(s) Performed: Procedure(s) (LRB): LAPAROSCOPIC CHOLECYSTECTOMY (N/A)  Patient Location: PACU  Anesthesia Type: General  Level of Consciousness: awake, alert  and oriented  Airway & Oxygen Therapy: Patient Spontanous Breathing and Patient connected to face mask oxygen  Post-op Assessment: Report given to PACU RN  Post vital signs: Reviewed and stable  Complications: No apparent anesthesia complications

## 2012-02-13 NOTE — Anesthesia Procedure Notes (Signed)
Procedure Name: Intubation Date/Time: 02/13/2012 10:27 AM Performed by: Glynn Octave E Pre-anesthesia Checklist: Patient identified, Patient being monitored, Timeout performed, Emergency Drugs available and Suction available Patient Re-evaluated:Patient Re-evaluated prior to inductionOxygen Delivery Method: Circle System Utilized Preoxygenation: Pre-oxygenation with 100% oxygen Intubation Type: IV induction, Cricoid Pressure applied and Rapid sequence Ventilation: Mask ventilation without difficulty Laryngoscope Size: Mac and 3 Grade View: Grade I Tube type: Oral Tube size: 7.0 mm Number of attempts: 1 Airway Equipment and Method: stylet Placement Confirmation: ETT inserted through vocal cords under direct vision,  positive ETCO2 and breath sounds checked- equal and bilateral Secured at: 21 cm Tube secured with: Tape Dental Injury: Teeth and Oropharynx as per pre-operative assessment

## 2012-02-13 NOTE — OR Nursing (Signed)
Up to bathroom ot void

## 2012-02-13 NOTE — Anesthesia Postprocedure Evaluation (Signed)
  Anesthesia Post-op Note  Patient: Jasmine Maynard  Procedure(s) Performed: Procedure(s) (LRB): LAPAROSCOPIC CHOLECYSTECTOMY (N/A)  Patient Location: PACU  Anesthesia Type: General  Level of Consciousness: awake and alert   Airway and Oxygen Therapy: Patient Spontanous Breathing  Post-op Pain: moderate  Post-op Assessment: Post-op Vital signs reviewed, Patient's Cardiovascular Status Stable, Respiratory Function Stable, Patent Airway and No signs of Nausea or vomiting  Post-op Vital Signs: Reviewed and stable  Complications: No apparent anesthesia complications

## 2012-02-13 NOTE — Progress Notes (Signed)
Awake. Crying/moanin/groaning. C/O postop abd pain. Reassurance given.

## 2012-02-13 NOTE — Op Note (Signed)
Patient:  Jasmine Maynard  DOB:  12/21/1990  MRN:  [id number]   Preop Diagnosis:  Biliary dyskinesia  Postop Diagnosis:  The same  Procedure:  Laparoscopic cholecystectomy  Surgeon:  Dr. Tilford Pillar  Anes:  General endotracheal, 0.5% Sensorcaine plain for local  Indications:  Patient is a 21 year old female presented to my office with a history of right upper quadrant abdominal pain. Workup and evaluation was consistent for biliary dyskinesia. Risks benefits and alternatives of a laparoscopic possible open cholecystectomy were discussed at length with the patient including but not limited to the risk of bleeding, infection, bile leak, small bowel injury, common bile duct injury, intraoperative cardiac and pulmonary events. Patient's questions and concerns were addressed and the patient was consented for the planned procedure.  Procedure note:  Patient was taken to the or was placed in the supine position on the or table time the general anesthetic is a Optician, dispensing. Once patient was asleep she was endotracheally intubated by the nurse anesthetist. At this point her abdomen is prepped with DuraPrep solution and draped in standard fashion. A stab incision was created supraumbilically with 11 blade scalpel with additional dissection down to subcuticular tissue carried out using a Coker clamp. A Coker clamp was utilized to grasp the anterior abdominal wall fascia and lift this anteriorly. A Veress needle is inserted saline drop test is utilized confirm intraperitoneal placement and then pneumoperitoneum was initiated. Once sufficient pneumoperitoneum was obtained an 11 mm was inserted over a laparoscope allowing visualization the trocar entering into the peritoneal cavity. At this point the inner cannula was removed the laparoscope was reinserted there is no evidence of any trocar or Veress needle placement injury. At this point the remaining trochars replaced a 5 mCi can epigastrium, 5 monos troponin  midline, and a 5 mm trocar in the right lateral abdominal wall. Patient's placed into a shoe now works left lateral decubitus position. The fundus of the gallbladder was grasped with septum of the right lobe liver. The peritoneal reflection onto the infundibulum is bluntly stripped using a [place] was utilized to expose both the cystic duct and cystic arteries and her into the infundibulum. A window was created behind both the structures. 3 endoclips placed proximally one distally and the cystic duct and the cystic duct was divided between 2 most is a close. Similarly 2 endoclips placed proximally one distally and the cystic artery was divided between 2 most is a clips. At this point the gallbladder was dissected free from the gallbladder fossa using electrocautery. Once gallbladder is free is placed into an Endo Catch bag and placed up and over the right lobe the liver. At this point inspection the gallbladder fossa indicate excellent hemostasis and excellent positioning of endoclips. There is no evidence of any bleeding or bile leak. At this time attention was turned to closure.  Using Endo Close suture passing device a 2-0 Vicryl sutures passed to the umbilical trocar site. With this suture and placed the gallbladder surgery does route to the umbilical trocar site and intact Endo Catch bag. At this point the pneumoperitoneum was evacuated. The trochars were removed. The local anesthetic is instilled. The Vicryl sutures secured. A 4-0 Monocryl was utilized to reapproximate the skin edges at all 4 trocar sites. The skin was washed dried moist dry towel. Benzoin is applied around incision half-inch are suture placed. The drapes removed the patient left come out of general anesthetic and stretcher the PACU in stable condition. At the conclusion of procedure  all instrument, sponge, needle counts are correct. Patient tolerated procedure well.  Complications:  None apparent  EBL:  Minimal  Specimen:   Gallbladder

## 2012-02-13 NOTE — Anesthesia Preprocedure Evaluation (Signed)
Anesthesia Evaluation  Patient identified by MRN, date of birth, ID band Patient awake    Reviewed: Allergy & Precautions, H&P , NPO status , Patient's Chart, lab work & pertinent test results  Airway Mallampati: II      Dental  (+) Teeth Intact   Pulmonary neg pulmonary ROS,  breath sounds clear to auscultation        Cardiovascular negative cardio ROS  Rhythm:Regular     Neuro/Psych PSYCHIATRIC DISORDERS Depression    GI/Hepatic GERD-  Medicated and Poorly Controlled,  Endo/Other    Renal/GU      Musculoskeletal   Abdominal   Peds  Hematology   Anesthesia Other Findings   Reproductive/Obstetrics                           Anesthesia Physical Anesthesia Plan  ASA: II  Anesthesia Plan: General   Post-op Pain Management:    Induction: Intravenous, Rapid sequence and Cricoid pressure planned  Airway Management Planned: Oral ETT  Additional Equipment:   Intra-op Plan:   Post-operative Plan: Extubation in OR  Informed Consent: I have reviewed the patients History and Physical, chart, labs and discussed the procedure including the risks, benefits and alternatives for the proposed anesthesia with the patient or authorized representative who has indicated his/her understanding and acceptance.     Plan Discussed with:   Anesthesia Plan Comments:         Anesthesia Quick Evaluation

## 2012-02-16 ENCOUNTER — Encounter (HOSPITAL_COMMUNITY): Payer: Self-pay | Admitting: General Surgery

## 2012-06-18 ENCOUNTER — Encounter (HOSPITAL_COMMUNITY): Payer: Self-pay | Admitting: Emergency Medicine

## 2012-06-18 ENCOUNTER — Emergency Department (HOSPITAL_COMMUNITY)
Admission: EM | Admit: 2012-06-18 | Discharge: 2012-06-18 | Disposition: A | Discharge status: Home / Self Care | Payer: Medicaid Other | Attending: Emergency Medicine | Admitting: Emergency Medicine

## 2012-06-18 DIAGNOSIS — IMO0002 Reserved for concepts with insufficient information to code with codable children: Secondary | ICD-10-CM | POA: Insufficient documentation

## 2012-06-18 DIAGNOSIS — R21 Rash and other nonspecific skin eruption: Secondary | ICD-10-CM | POA: Insufficient documentation

## 2012-06-18 HISTORY — DX: Herpesviral infection, unspecified: B00.9

## 2012-06-18 MED ORDER — HYDROXYZINE HCL 25 MG PO TABS
50.0000 mg | ORAL_TABLET | Freq: Once | ORAL | Status: AC
  Administered 2012-06-18: 50 mg via ORAL
  Filled 2012-06-18: qty 2

## 2012-06-18 MED ORDER — PREDNISONE 20 MG PO TABS: ORAL_TABLET | ORAL | Status: DC

## 2012-06-18 MED ORDER — HYDROMORPHONE HCL PF 1 MG/ML IJ SOLN
1.0000 mg | Freq: Once | INTRAMUSCULAR | Status: AC
  Administered 2012-06-18: 1 mg via INTRAMUSCULAR
  Filled 2012-06-18: qty 1

## 2012-06-18 MED ORDER — PREDNISONE 20 MG PO TABS
60.0000 mg | ORAL_TABLET | Freq: Once | ORAL | Status: AC
  Administered 2012-06-18: 60 mg via ORAL
  Filled 2012-06-18: qty 3

## 2012-06-18 MED ORDER — HYDROXYZINE HCL 50 MG PO TABS: 50.0000 mg | ORAL_TABLET | Freq: Four times a day (QID) | ORAL | Status: AC | PRN

## 2012-06-18 NOTE — ED Notes (Signed)
Pt has small red raised areas on bilateral arms and lower legs; rash started last pm on legs and moved to arms this afternoon. Pt c/o itching pt has taken Bendryl 50mg  po about 6 hours ago without relief of itching; pt states rash is also painful

## 2012-06-18 NOTE — ED Notes (Signed)
Pt stable at discharge Pt to follow up with MD tomorrow or return here if needed

## 2012-06-18 NOTE — ED Provider Notes (Signed)
History     CSN: [id number]  Arrival date & time 06/18/12  7829   First MD Initiated Contact with Patient 06/18/12 2035      Chief Complaint  Patient presents with  . Rash    (Consider location/radiation/quality/duration/timing/severity/associated sxs/prior treatment) HPI Comments: Jasmine Maynard presents with a 1 day history of scattered raised and tender but also itchy papules, some which have started developed small blisters which have popped and now draining clear fluid, and they continue to spread.   She denies any new exposures to chemicals,  Soaps,etc.  The rash is only on her upper and lower distal extremities,  Sparingly on her hands.  She denies fevers or chills. She has had no recent travel, and her daughter who lives in her home is rash free.  She has no pets and has not been outdoors, except for 4 days ago when a few ants, one that bit her,  Ran across her right foot.    The history is provided by the patient.    Past Medical History  Diagnosis Date  . DDD (degenerative disc disease)   . Depression   . Herpes     Past Surgical History  Procedure Date  . Intrauterine device insertion 2011  . Myringotomy 2011    right  . Cholecystectomy 02/13/2012    Procedure: LAPAROSCOPIC CHOLECYSTECTOMY;  Surgeon: Fabio Bering, MD;  Location: [facility];  Service: General;  Laterality: N/A;    History reviewed. No pertinent family history.  History  Substance Use Topics  . Smoking status: Never Smoker   . Smokeless tobacco: Not on file  . Alcohol Use: Yes     OCC    OB History    Grav Para Term Preterm Abortions TAB SAB Ect Mult Living                  Review of Systems  Constitutional: Negative for fever and chills.  HENT: Negative for facial swelling.   Respiratory: Negative for shortness of breath and wheezing.   Skin: Positive for rash.  Neurological: Negative for numbness.    Allergies  Amoxicillin and Latex  Home Medications   Current Outpatient Rx    Name Route Sig Dispense Refill  . DIPHENHYDRAMINE HCL 25 MG PO TABS Oral Take 50 mg by mouth once as needed. FOR ALLERGIC REACTION    . LEVONORGESTREL 20 MCG/24HR IU IUD Intrauterine 1 each by Intrauterine route once.    . OMEPRAZOLE 20 MG PO CPDR Oral Take 20 mg by mouth 2 (two) times daily.    Marland Kitchen HYDROXYZINE HCL 50 MG PO TABS Oral Take 1 tablet (50 mg total) by mouth every 6 (six) hours as needed for itching. 20 tablet 0  . PREDNISONE 20 MG PO TABS  6, 5, 4, 3, 2 then 1 tablet by mouth daily for 6 days total. 21 tablet 0    BP 139/91  Pulse 89  Temp 98.9 F (37.2 C) (Oral)  Resp 16  Ht 5\' 4"  (1.626 m)  Wt 170 lb (77.111 kg)  BMI 29.18 kg/m2  SpO2 100%  Physical Exam  Constitutional: She appears well-developed and well-nourished. No distress.  HENT:  Head: Normocephalic.  Neck: Neck supple.  Cardiovascular: Normal rate.   Pulmonary/Chest: Effort normal. She has no wheezes.  Musculoskeletal: Normal range of motion. She exhibits no edema.  Skin: Rash noted.       Scattered raised papular lesions, slightly erythematous base,  Several with serous drainage.  Average  size 3 mm.  No pustules,  No surrounding erythema, no fluctuance.  All lesions locating on forearms and lower extremities excluding hands and feet.    ED Course  Procedures (including critical care time)  Labs Reviewed - No data to display No results found.   1. Rash       MDM  Suspect allergic or contact  dermatitis.  Pt without fevers, no coryza sx, no tick bite exposure.  No other household members with sx.    Pt given atarax in place of benadryl.  Prednisone taper.  Recheck if not improving over the next 1-2 days.  (Pt states has appt with pcp in am!).        Burgess Amor, [place] 06/19/12 [phone]

## 2012-06-18 NOTE — ED Notes (Signed)
Patient complaining of raised bumps on upper and lower extremities started yesterday. States "they itch really bad and yesterday the ones on my legs were seeping something."

## 2012-06-19 NOTE — ED Provider Notes (Signed)
Medical screening examination/treatment/procedure(s) were performed by non-physician practitioner and as supervising physician I was immediately available for consultation/collaboration.    Dione Booze, MD 06/19/12 [phone]

## 2013-02-02 ENCOUNTER — Encounter: Payer: Self-pay | Admitting: *Deleted

## 2013-02-11 ENCOUNTER — Ambulatory Visit (INDEPENDENT_AMBULATORY_CARE_PROVIDER_SITE_OTHER): Payer: Medicaid Other | Admitting: Nurse Practitioner

## 2013-02-11 ENCOUNTER — Encounter: Payer: Self-pay | Admitting: Nurse Practitioner

## 2013-02-11 VITALS — BP 130/78 | Wt 160.0 lb

## 2013-02-11 DIAGNOSIS — F909 Attention-deficit hyperactivity disorder, unspecified type: Secondary | ICD-10-CM

## 2013-02-11 DIAGNOSIS — F418 Other specified anxiety disorders: Secondary | ICD-10-CM | POA: Insufficient documentation

## 2013-02-11 DIAGNOSIS — F411 Generalized anxiety disorder: Secondary | ICD-10-CM

## 2013-02-11 MED ORDER — AMPHETAMINE-DEXTROAMPHET ER 15 MG PO CP24: 15.0000 mg | ORAL_CAPSULE | Freq: Every day | ORAL | Status: DC

## 2013-02-11 MED ORDER — SERTRALINE HCL 50 MG PO TABS: 50.0000 mg | ORAL_TABLET | Freq: Every day | ORAL | Status: DC

## 2013-02-11 MED ORDER — AMPHETAMINE-DEXTROAMPHET ER 15 MG PO CP24: 15.0000 mg | ORAL_CAPSULE | ORAL | Status: DC

## 2013-02-11 NOTE — Assessment & Plan Note (Signed)
Assessment plan adult ADHD stable Plan: Given 3 separate monthly prescriptions for Adderall XR 15 mg daily.

## 2013-02-11 NOTE — Assessment & Plan Note (Signed)
Assessment: Depression/anxiety exacerbation Plan: Stop Celexa. Switch to Zoloft 50 mg half tab by mouth daily for several days and if tolerated increase to one by mouth daily. Cautioned about potential adverse effects. DC med and call if any problems. Recheck in one month, sooner if any problems. [facility]

## 2013-02-11 NOTE — Progress Notes (Signed)
Subjective: Presents for recheck. Celexa has helped with some of her anxiety symptoms, does not feel daily overwhelming anxiety. Continues to have some panic attacks. About 2 weeks of taking Celexa, patient began experiencing extreme fatigue and feeling tired all the time. Not sleeping well. Otherwise denies any adverse affects. ADHD symptoms are very well controlled Adderall XR 15 mg daily. Objective: NAD. Alert, oriented. Lungs clear. Heart regular rate rhythm.

## 2013-02-28 ENCOUNTER — Encounter: Payer: Self-pay | Admitting: Family Medicine

## 2013-02-28 ENCOUNTER — Ambulatory Visit (INDEPENDENT_AMBULATORY_CARE_PROVIDER_SITE_OTHER): Payer: Medicaid Other | Admitting: Family Medicine

## 2013-02-28 VITALS — BP 122/76 | HR 70 | Wt 165.0 lb

## 2013-02-28 DIAGNOSIS — T148XXA Other injury of unspecified body region, initial encounter: Secondary | ICD-10-CM

## 2013-02-28 NOTE — Progress Notes (Signed)
  Subjective:    Patient ID: Jasmine Maynard, female    DOB: 11-02-1991, 22 y.o.   MRN: [id number]  Foot Pain This is a new problem. The current episode started today. The problem occurs constantly. The problem has been gradually worsening. The symptoms are aggravated by bending. She has tried nothing for the symptoms.   Patient struck foot suddenly. Struck it against a car door. Notes sharp pain. Some limping when walking. Pain radiates towards ankle. No noticeable swelling. No medications yet.   Review of Systems    ROS otherwise negative. Objective:   Physical Exam  Alert no acute distress. HEENT normal. Lungs clear. Heart regular in rhythm. Dorsal left foot tender to palpation. Pulses good slight edema. Ankle good range of motion.      Assessment & Plan:  Impression contusion. Plan local measures discussed. Voltaren twice a day to affected area. Localized. 15 minutes spent most in discussion. WSL

## 2013-03-13 ENCOUNTER — Ambulatory Visit: Payer: Medicaid Other | Admitting: Nurse Practitioner

## 2013-06-06 ENCOUNTER — Ambulatory Visit (INDEPENDENT_AMBULATORY_CARE_PROVIDER_SITE_OTHER): Payer: Medicaid Other | Admitting: Family Medicine

## 2013-06-06 ENCOUNTER — Encounter: Payer: Self-pay | Admitting: Family Medicine

## 2013-06-06 VITALS — BP 122/68 | Temp 98.2°F | Wt 168.0 lb

## 2013-06-06 DIAGNOSIS — H6691 Otitis media, unspecified, right ear: Secondary | ICD-10-CM

## 2013-06-06 DIAGNOSIS — H6690 Otitis media, unspecified, unspecified ear: Secondary | ICD-10-CM

## 2013-06-06 MED ORDER — OFLOXACIN 0.3 % OT SOLN: 5.0000 [drp] | Freq: Two times a day (BID) | OTIC | Status: DC

## 2013-06-06 MED ORDER — CEFDINIR 300 MG PO CAPS: 300.0000 mg | ORAL_CAPSULE | Freq: Two times a day (BID) | ORAL | Status: DC

## 2013-06-06 NOTE — Progress Notes (Signed)
  Subjective:    Patient ID: Jasmine Maynard, female    DOB: 1991/07/12, 22 y.o.   MRN: [id number]  HPI Hx of recurrent om, required  Tube inserted at the time.  ENT TEogh, said that ear might need to be re-evaluated  Recent cough congestion stuffiness. No fever. Review of Systems No nausea no vomiting no abdominal pain no rash ROS otherwise negative    Objective:   Physical Exam Alert no acute distress. Right TM to present some discharge mild erythema. Nasal congestion. Pharynx normal neck supple. Lungs clear heart regular in rhythm.       Assessment & Plan:  Impression right otitis media. Tube appears to be working still. Discussed. Plan appropriate antibiotics. Appropriate symptomatic care discussed. WSL

## 2013-06-11 ENCOUNTER — Telehealth: Payer: Self-pay | Admitting: Family Medicine

## 2013-06-11 NOTE — Telephone Encounter (Signed)
Patient seen Friday 06/06/13. Her cough is getting worse and theres still stuff running down her throat. Her ear pain still feels about the same as it did Friday and now left ear is bothering her. Please advise.    [place]

## 2013-06-11 NOTE — Telephone Encounter (Signed)
On a strong combination of meds--stay on and add robitussin dm for cough

## 2013-06-12 NOTE — Telephone Encounter (Signed)
Notified patient on a strong combination of meds--stay on and add robitussin dm for cough. Patient verbalized understanding.

## 2013-06-12 NOTE — Telephone Encounter (Signed)
Left message on voicemail to return call.

## 2013-06-14 ENCOUNTER — Encounter: Payer: Self-pay | Admitting: Family Medicine

## 2013-06-14 ENCOUNTER — Ambulatory Visit (INDEPENDENT_AMBULATORY_CARE_PROVIDER_SITE_OTHER): Payer: Medicaid Other | Admitting: Family Medicine

## 2013-06-14 VITALS — BP 112/72 | Temp 98.9°F | Wt 169.5 lb

## 2013-06-14 DIAGNOSIS — J209 Acute bronchitis, unspecified: Secondary | ICD-10-CM

## 2013-06-14 MED ORDER — CLARITHROMYCIN 500 MG PO TABS: 500.0000 mg | ORAL_TABLET | Freq: Two times a day (BID) | ORAL | Status: AC

## 2013-06-14 MED ORDER — BENZONATATE 100 MG PO CAPS: 100.0000 mg | ORAL_CAPSULE | Freq: Four times a day (QID) | ORAL | Status: DC | PRN

## 2013-06-14 MED ORDER — ALBUTEROL SULFATE HFA 108 (90 BASE) MCG/ACT IN AERS: 2.0000 | INHALATION_SPRAY | Freq: Four times a day (QID) | RESPIRATORY_TRACT | Status: DC | PRN

## 2013-06-14 NOTE — Progress Notes (Signed)
  Subjective:    Patient ID: Jasmine Maynard, female    DOB: February 08, 1991, 22 y.o.   MRN: [id number]  HPI Patient arrives office with significant cough. Occurring very frequently. Day and night. Patient feels the [place] is not helping. No obvious fevers. Some wheezing and tightness.   Review of Systems No chest pain no vomiting no diarrhea ROS otherwise negative    Objective:   Physical Exam Alert no apparent distress. Other than significant coughing. HEENT mild nasal congestion. Lungs bilateral wheezes. With coughing. No tachypnea heart regular in rhythm.       Assessment & Plan:  Impression subacute bronchitis with reactive airways. Plan Biaxin 500 twice a day 10 days. Tessalon Perles when necessary for coughing Ventolin when necessary for wheezing. WSL

## 2013-08-03 ENCOUNTER — Emergency Department (HOSPITAL_COMMUNITY)
Admission: EM | Admit: 2013-08-03 | Discharge: 2013-08-03 | Disposition: A | Discharge status: Home / Self Care | Payer: Medicaid Other | Attending: Emergency Medicine | Admitting: Emergency Medicine

## 2013-08-03 ENCOUNTER — Encounter (HOSPITAL_COMMUNITY): Payer: Self-pay | Admitting: *Deleted

## 2013-08-03 DIAGNOSIS — R630 Anorexia: Secondary | ICD-10-CM | POA: Insufficient documentation

## 2013-08-03 DIAGNOSIS — Z79899 Other long term (current) drug therapy: Secondary | ICD-10-CM | POA: Insufficient documentation

## 2013-08-03 DIAGNOSIS — Z8619 Personal history of other infectious and parasitic diseases: Secondary | ICD-10-CM | POA: Insufficient documentation

## 2013-08-03 DIAGNOSIS — J029 Acute pharyngitis, unspecified: Secondary | ICD-10-CM | POA: Insufficient documentation

## 2013-08-03 DIAGNOSIS — F3289 Other specified depressive episodes: Secondary | ICD-10-CM | POA: Insufficient documentation

## 2013-08-03 DIAGNOSIS — Z880 Allergy status to penicillin: Secondary | ICD-10-CM | POA: Insufficient documentation

## 2013-08-03 DIAGNOSIS — Z91040 Latex allergy status: Secondary | ICD-10-CM | POA: Insufficient documentation

## 2013-08-03 DIAGNOSIS — Z8709 Personal history of other diseases of the respiratory system: Secondary | ICD-10-CM | POA: Insufficient documentation

## 2013-08-03 DIAGNOSIS — Z8739 Personal history of other diseases of the musculoskeletal system and connective tissue: Secondary | ICD-10-CM | POA: Insufficient documentation

## 2013-08-03 DIAGNOSIS — H9209 Otalgia, unspecified ear: Secondary | ICD-10-CM | POA: Insufficient documentation

## 2013-08-03 DIAGNOSIS — J039 Acute tonsillitis, unspecified: Secondary | ICD-10-CM

## 2013-08-03 DIAGNOSIS — F329 Major depressive disorder, single episode, unspecified: Secondary | ICD-10-CM | POA: Insufficient documentation

## 2013-08-03 DIAGNOSIS — R110 Nausea: Secondary | ICD-10-CM | POA: Insufficient documentation

## 2013-08-03 LAB — RAPID STREP SCREEN (MED CTR MEBANE ONLY): Streptococcus, Group A Screen (Direct): NEGATIVE

## 2013-08-03 MED ORDER — HYDROCODONE-ACETAMINOPHEN 5-325 MG PO TABS: 1.0000 | ORAL_TABLET | ORAL | Status: DC | PRN

## 2013-08-03 MED ORDER — DEXAMETHASONE SODIUM PHOSPHATE 4 MG/ML IJ SOLN
10.0000 mg | Freq: Once | INTRAMUSCULAR | Status: AC
  Administered 2013-08-03: 10 mg via INTRAMUSCULAR
  Filled 2013-08-03: qty 3

## 2013-08-03 NOTE — ED Provider Notes (Signed)
CSN: [id number]     Arrival date & time 08/03/13  1429 History   First MD Initiated Contact with Patient 08/03/13 1452     Chief Complaint  Patient presents with  . Nasal Congestion  . Fever  . Sore Throat   (Consider location/radiation/quality/duration/timing/severity/associated sxs/prior Treatment) Patient is a 22 y.o. female presenting with pharyngitis. The history is provided by the patient.  Sore Throat This is a new problem. The current episode started in the past 7 days. The problem occurs constantly. The problem has been gradually worsening. Associated symptoms include anorexia, chills, congestion, a fever, headaches, nausea, a sore throat and swollen glands. Pertinent negatives include no coughing, rash or vomiting.   Jasmine Maynard is a 22 y.o. female who presents to the ED with sore throat headache and body aches with fever x 5 days.   Past Medical History  Diagnosis Date  . DDD (degenerative disc disease)   . Depression   . Herpes   . ADHD (attention deficit hyperactivity disorder)   . Perennial allergic rhinitis    Past Surgical History  Procedure Laterality Date  . Intrauterine device insertion  2011  . Myringotomy  2011    right  . Cholecystectomy  02/13/2012    Procedure: LAPAROSCOPIC CHOLECYSTECTOMY;  Surgeon: Fabio Bering, MD;  Location: [facility];  Service: General;  Laterality: N/A;   History reviewed. No pertinent family history. History  Substance Use Topics  . Smoking status: Never Smoker   . Smokeless tobacco: Never Used  . Alcohol Use: No   OB History   Grav Para Term Preterm Abortions TAB SAB Ect Mult Living                 Review of Systems  Constitutional: Positive for fever and chills.  HENT: Positive for ear pain, congestion and sore throat. Negative for neck stiffness.   Respiratory: Negative for cough.   Gastrointestinal: Positive for nausea and anorexia. Negative for vomiting.  Genitourinary: Negative for dysuria, urgency and  frequency.  Musculoskeletal: Negative for back pain.  Skin: Negative for rash.  Neurological: Positive for headaches. Negative for dizziness.  Psychiatric/Behavioral: The patient is not nervous/anxious.     Allergies  Amoxicillin; Avelox; Doxycycline; and Latex  Home Medications   Current Outpatient Rx  Name  Route  Sig  Dispense  Refill  . albuterol (PROVENTIL HFA;VENTOLIN HFA) 108 (90 BASE) MCG/ACT inhaler   Inhalation   Inhale 2 puffs into the lungs 4 (four) times daily as needed for wheezing.   1 Inhaler   0   . amphetamine-dextroamphetamine (ADDERALL XR) 15 MG 24 hr capsule   Oral   Take 1 capsule (15 mg total) by mouth daily.   30 capsule   0     May fill after 03/13/13   . benzonatate (TESSALON PERLES) 100 MG capsule   Oral   Take 1 capsule (100 mg total) by mouth every 6 (six) hours as needed for cough.   30 capsule   0   . cefdinir (OMNICEF) 300 MG capsule   Oral   Take 1 capsule (300 mg total) by mouth 2 (two) times daily.   20 capsule   0   . diphenhydrAMINE (BENADRYL) 25 MG tablet   Oral   Take 50 mg by mouth once as needed. FOR ALLERGIC REACTION         . levonorgestrel (MIRENA) 20 MCG/24HR IUD   Intrauterine   1 each by Intrauterine route once.         Marland Kitchen  methocarbamol (ROBAXIN) 750 MG tablet   Oral   Take 750 mg by mouth 3 (three) times daily. PRN         . ofloxacin (FLOXIN) 0.3 % otic solution   Right Ear   Place 5 drops into the right ear 2 (two) times daily.   5 mL   0   . omeprazole (PRILOSEC) 20 MG capsule   Oral   Take 20 mg by mouth 2 (two) times daily as needed.          . sertraline (ZOLOFT) 50 MG tablet   Oral   Take 1 tablet (50 mg total) by mouth daily.   30 tablet   2    BP 133/79  Pulse 85  Temp(Src) 98.7 F (37.1 C) (Oral)  Resp 18  Ht 5\' 4"  (1.626 m)  Wt 165 lb (74.844 kg)  BMI 28.31 kg/m2  SpO2 100%  LMP 07/22/2013 Physical Exam  Nursing note and vitals reviewed. Constitutional: She is oriented to  person, place, and time. She appears well-developed and well-nourished.  HENT:  Head: Normocephalic and atraumatic.  Right Ear: Tympanic membrane normal.  Left Ear: Tympanic membrane normal.  Nose: Mucosal edema and rhinorrhea present.  Mouth/Throat: Uvula is midline and mucous membranes are normal. Posterior oropharyngeal erythema present.  Tonsils enlarged with exudate.   Eyes: Conjunctivae and EOM are normal. Pupils are equal, round, and reactive to light.  Neck: Normal range of motion. Neck supple.  No meningeal signs  Cardiovascular: Normal rate, regular rhythm and normal heart sounds.   Pulmonary/Chest: Effort normal and breath sounds normal.  Abdominal: Soft. There is no tenderness.  Musculoskeletal: Normal range of motion.  Neurological: She is alert and oriented to person, place, and time. No cranial nerve deficit.  Skin: Skin is warm and dry.  Psychiatric: She has a normal mood and affect. Her behavior is normal.    ED Course  Procedures  MDM  22 y.o. female with sore throat, enlarged tonsils, body aches x 5 days. Strep and mono test at Urgent care 3 days ago negative. Taking Omnicef since that visit.  Discussed with the patient clinical findings plan of care. All questioned fully answered. Patient voices understanding. She will return for any problems.     Janne Napoleon, [place] 08/03/13 1620

## 2013-08-03 NOTE — ED Notes (Signed)
Congestion, sore throat, swollen throat, headache, body aches and fevers since Monday.  Nausea/vomiting which got worse after taking antibx prescribed by urgent care.  Had swabs there for mono and strep which were negative.

## 2013-08-04 NOTE — ED Provider Notes (Signed)
Medical screening examination/treatment/procedure(s) were performed by non-physician practitioner and as supervising physician I was immediately available for consultation/collaboration.    Celene Kras, MD 08/04/13 [phone]

## 2013-08-05 LAB — CULTURE, GROUP A STREP

## 2013-08-08 ENCOUNTER — Ambulatory Visit (INDEPENDENT_AMBULATORY_CARE_PROVIDER_SITE_OTHER): Payer: Medicaid Other | Admitting: Family Medicine

## 2013-08-08 ENCOUNTER — Encounter: Payer: Self-pay | Admitting: Family Medicine

## 2013-08-08 VITALS — BP 120/72 | Temp 99.0°F | Ht 64.0 in | Wt 172.2 lb

## 2013-08-08 DIAGNOSIS — J029 Acute pharyngitis, unspecified: Secondary | ICD-10-CM

## 2013-08-08 DIAGNOSIS — R509 Fever, unspecified: Secondary | ICD-10-CM

## 2013-08-08 LAB — CBC
MCH: 29.1 pg (ref 26.0–34.0)
Platelets: 362 10*3/uL (ref 150–400)
RBC: 4.81 MIL/uL (ref 3.87–5.11)
RDW: 12.6 % (ref 11.5–15.5)

## 2013-08-08 MED ORDER — AZITHROMYCIN 250 MG PO TABS: ORAL_TABLET | ORAL | Status: DC

## 2013-08-08 NOTE — Progress Notes (Signed)
  Subjective:    Patient ID: Jasmine Maynard, female    DOB: 07/01/1991, 22 y.o.   MRN: [id number]  HPI Comments: Has been swabbed twice for Strep A and both tests were negative.  Fever  This is a new problem. The current episode started in the past 7 days. The problem occurs constantly. Associated symptoms include abdominal pain, congestion, diarrhea, headaches, muscle aches and a sore throat. Associated symptoms comments: Neck pain. She has tried acetaminophen and NSAIDs for the symptoms. The treatment provided mild relief.   Seen at the urgent care last week. Had a Monospot test done this was negative. Only 3 days and to illness however. Then had to go to the emergency room. Was told she had a viral infection.   Review of Systems  Constitutional: Positive for fever.  HENT: Positive for congestion and sore throat.   Gastrointestinal: Positive for abdominal pain and diarrhea.  Neurological: Positive for headaches.       Objective:   Physical Exam Alert talkative some discomfort with throat. TMs normal pharynx impressive erythema tender anterior nodes neck supple lungs clear heart regular rate and rhythm       Assessment & Plan:  Impression acute pharyngitis prolonged fairly severe may have mono plan CBC and Monospot test. Add Z-Pak for prolonged nature for possibility of mycoplasma etc. fluids encourage symptomatic care discussed WSL

## 2013-08-09 ENCOUNTER — Other Ambulatory Visit: Payer: Self-pay

## 2013-08-09 LAB — MONONUCLEOSIS SCREEN: Mono Screen: POSITIVE — AB

## 2013-08-09 MED ORDER — PREDNISONE 20 MG PO TABS: ORAL_TABLET | ORAL | Status: DC

## 2013-08-13 ENCOUNTER — Ambulatory Visit (INDEPENDENT_AMBULATORY_CARE_PROVIDER_SITE_OTHER): Payer: Medicaid Other | Admitting: Family Medicine

## 2013-08-13 ENCOUNTER — Encounter: Payer: Self-pay | Admitting: Family Medicine

## 2013-08-13 VITALS — BP 130/76 | Temp 98.7°F | Ht 64.0 in | Wt 175.8 lb

## 2013-08-13 DIAGNOSIS — B2790 Infectious mononucleosis, unspecified without complication: Secondary | ICD-10-CM

## 2013-08-13 NOTE — Progress Notes (Signed)
  Subjective:    Patient ID: Jasmine Maynard, female    DOB: 05-Nov-1991, 22 y.o.   MRN: [id number]  HPI Patient arrives to follow up on recent diagnosis of Mono.   Patient reports se is feeling better.  pred much improved on.  Occasional mild abdominal discomfort but improved. Minimal nausea.  Occasional posterior neck pain not particularly worse with motion. Improved compared to last week.  Review of Systems No chest pain no cough no sore throat no abdominal pain no fever ROS otherwise negative    Objective:   Physical Exam  Alert talkative no acute distress. Lungs clear. Heart regular in rhythm. HEENT pharynx erythematous but much improved. Much less swelling neck supple.      Assessment & Plan:  Impression 1 Mono clinically improved plan warning signs discussed symptomatic care discussed WSL

## 2013-09-16 ENCOUNTER — Encounter: Payer: Self-pay | Admitting: Nurse Practitioner

## 2013-09-16 ENCOUNTER — Ambulatory Visit (INDEPENDENT_AMBULATORY_CARE_PROVIDER_SITE_OTHER): Payer: Medicaid Other | Admitting: Nurse Practitioner

## 2013-09-16 VITALS — BP 132/88 | Ht 64.0 in | Wt 179.4 lb

## 2013-09-16 DIAGNOSIS — F341 Dysthymic disorder: Secondary | ICD-10-CM

## 2013-09-16 DIAGNOSIS — F418 Other specified anxiety disorders: Secondary | ICD-10-CM

## 2013-09-16 DIAGNOSIS — G8929 Other chronic pain: Secondary | ICD-10-CM

## 2013-09-16 DIAGNOSIS — M545 Low back pain: Secondary | ICD-10-CM

## 2013-09-16 DIAGNOSIS — M47816 Spondylosis without myelopathy or radiculopathy, lumbar region: Secondary | ICD-10-CM

## 2013-09-16 DIAGNOSIS — M47817 Spondylosis without myelopathy or radiculopathy, lumbosacral region: Secondary | ICD-10-CM

## 2013-09-16 MED ORDER — SERTRALINE HCL 50 MG PO TABS: 50.0000 mg | ORAL_TABLET | Freq: Every day | ORAL | Status: DC

## 2013-09-17 ENCOUNTER — Encounter: Payer: Self-pay | Admitting: Nurse Practitioner

## 2013-09-17 DIAGNOSIS — M47816 Spondylosis without myelopathy or radiculopathy, lumbar region: Secondary | ICD-10-CM | POA: Insufficient documentation

## 2013-09-17 DIAGNOSIS — G8929 Other chronic pain: Secondary | ICD-10-CM | POA: Insufficient documentation

## 2013-09-17 NOTE — Assessment & Plan Note (Signed)
Schedule physical therapy twice a week x3 weeks. 

## 2013-09-17 NOTE — Assessment & Plan Note (Signed)
Schedule physical therapy twice a week x3 weeks.

## 2013-09-17 NOTE — Progress Notes (Signed)
Subjective:  Presents for recheck of her chronic back pain. Has seen a specialist for this in the past. Cannot afford to go back to specialist at this point, physical therapy has been recommended. Over the past 6 months patient has developed generalized lumbar area pain off-and-on, occasionally will have a sharp pain going from her left buttock to her foot lasting no more than 5 minutes. Very rare/sporadic. Usually worse with certain movements such as squatting. Also would like to restart her Zoloft for her anxiety. Was on 50 mg at one point, does not feel like it was enough medication.  Objective:   BP 132/88  Ht 5\' 4"  (1.626 m)  Wt 179 lb 6.4 oz (81.375 kg)  BMI 30.78 kg/m2 NAD. Alert, oriented. Lungs clear. Heart regular rhythm. Distinct tenderness noted in the lumbar paraspinal area on the left side into the left mid buttock. SLR negative bilateral, reflexes normal lower extremities.  Assessment:Degenerative arthritis of lumbar spine - Plan: Ambulatory referral to Physical Therapy  Chronic low back pain - Plan: Ambulatory referral to Physical Therapy  Depression with anxiety  Plan: Meds ordered this encounter  Medications  . sertraline (ZOLOFT) 50 MG tablet    Sig: Take 1 tablet (50 mg total) by mouth daily.    Dispense:  30 tablet    Refill:  2    Order Specific Question:  Supervising Provider    Answer:  Merlyn Albert [[id number]]   Restart Zoloft 50 mg daily for 2 weeks and then call if medication needs to be increased. Ice/heat applications to her back. Gentle stretching exercises. Referral made to physical therapy. Further followup based on response to therapy.

## 2013-09-17 NOTE — Assessment & Plan Note (Signed)
Restart Zoloft 50 mg daily for 2 weeks and then call if medication needs to be increased.

## 2013-09-25 ENCOUNTER — Ambulatory Visit (HOSPITAL_COMMUNITY): Payer: Medicaid Other | Admitting: Physical Therapy

## 2013-10-26 ENCOUNTER — Encounter (HOSPITAL_COMMUNITY): Payer: Self-pay | Admitting: Emergency Medicine

## 2013-10-26 ENCOUNTER — Emergency Department (HOSPITAL_COMMUNITY)
Admission: EM | Admit: 2013-10-26 | Discharge: 2013-10-26 | Disposition: A | Discharge status: Home / Self Care | Payer: Medicaid Other | Attending: Emergency Medicine | Admitting: Emergency Medicine

## 2013-10-26 DIAGNOSIS — H9209 Otalgia, unspecified ear: Secondary | ICD-10-CM | POA: Insufficient documentation

## 2013-10-26 DIAGNOSIS — Z8619 Personal history of other infectious and parasitic diseases: Secondary | ICD-10-CM | POA: Insufficient documentation

## 2013-10-26 DIAGNOSIS — H9201 Otalgia, right ear: Secondary | ICD-10-CM

## 2013-10-26 DIAGNOSIS — IMO0002 Reserved for concepts with insufficient information to code with codable children: Secondary | ICD-10-CM | POA: Insufficient documentation

## 2013-10-26 DIAGNOSIS — F3289 Other specified depressive episodes: Secondary | ICD-10-CM | POA: Insufficient documentation

## 2013-10-26 DIAGNOSIS — Z880 Allergy status to penicillin: Secondary | ICD-10-CM | POA: Insufficient documentation

## 2013-10-26 DIAGNOSIS — F329 Major depressive disorder, single episode, unspecified: Secondary | ICD-10-CM | POA: Insufficient documentation

## 2013-10-26 DIAGNOSIS — Z91040 Latex allergy status: Secondary | ICD-10-CM | POA: Insufficient documentation

## 2013-10-26 DIAGNOSIS — J3489 Other specified disorders of nose and nasal sinuses: Secondary | ICD-10-CM | POA: Insufficient documentation

## 2013-10-26 DIAGNOSIS — Z8709 Personal history of other diseases of the respiratory system: Secondary | ICD-10-CM | POA: Insufficient documentation

## 2013-10-26 DIAGNOSIS — Z79899 Other long term (current) drug therapy: Secondary | ICD-10-CM | POA: Insufficient documentation

## 2013-10-26 MED ORDER — TRAMADOL HCL 50 MG PO TABS: 50.0000 mg | ORAL_TABLET | Freq: Four times a day (QID) | ORAL | Status: DC | PRN

## 2013-10-26 MED ORDER — AZITHROMYCIN 250 MG PO TABS: ORAL_TABLET | ORAL | Status: DC

## 2013-10-26 MED ORDER — AZITHROMYCIN 250 MG PO TABS
500.0000 mg | ORAL_TABLET | Freq: Once | ORAL | Status: AC
  Administered 2013-10-26: 500 mg via ORAL
  Filled 2013-10-26: qty 2

## 2013-10-26 NOTE — ED Provider Notes (Signed)
CSN: [id number]     Arrival date & time 10/26/13  1004 History   First MD Initiated Contact with Patient 10/26/13 1008     Chief Complaint  Patient presents with  . Otalgia   (Consider location/radiation/quality/duration/timing/severity/associated sxs/prior Treatment) Patient is a 22 y.o. female presenting with ear pain. The history is provided by the patient.  Otalgia Location:  Right Behind ear:  No abnormality Quality:  Pressure, aching and dull Severity:  Moderate Onset quality:  Sudden Duration:  12 hours Timing:  Constant Progression:  Unchanged Chronicity:  Recurrent Context: water   Relieved by:  Nothing Worsened by:  Nothing tried Ineffective treatments:  None tried Associated symptoms: congestion, hearing loss and rhinorrhea   Associated symptoms: no abdominal pain, no cough, no fever, no headaches, no neck pain, no rash, no sore throat, no tinnitus and no vomiting   Congestion:    Location:  Nasal   Interferes with sleep: no     Interferes with eating/drinking: no   Rhinorrhea:    Quality:  Clear   Severity:  Mild   Timing:  Intermittent   Progression:  Unchanged Risk factors comment:  Typanostomy tube in the right ear since 2011   Past Medical History  Diagnosis Date  . DDD (degenerative disc disease)   . Depression   . Herpes   . ADHD (attention deficit hyperactivity disorder)   . Perennial allergic rhinitis    Past Surgical History  Procedure Laterality Date  . Intrauterine device insertion  2011  . Myringotomy  2011    right  . Cholecystectomy  02/13/2012    Procedure: LAPAROSCOPIC CHOLECYSTECTOMY;  Surgeon: Fabio Bering, MD;  Location: [facility];  Service: General;  Laterality: N/A;   No family history on file. History  Substance Use Topics  . Smoking status: Never Smoker   . Smokeless tobacco: Never Used  . Alcohol Use: No   OB History   Grav Para Term Preterm Abortions TAB SAB Ect Mult Living                 Review of Systems   Constitutional: Negative for fever.  HENT: Positive for congestion, ear pain, hearing loss and rhinorrhea. Negative for facial swelling, sinus pressure, sneezing, sore throat, tinnitus, trouble swallowing and voice change.   Respiratory: Negative for cough.   Gastrointestinal: Negative for vomiting and abdominal pain.  Musculoskeletal: Negative for arthralgias and neck pain.  Skin: Negative for rash.  Neurological: Negative for dizziness, facial asymmetry, speech difficulty, numbness and headaches.  All other systems reviewed and are negative.    Allergies  Amoxicillin; Avelox; Doxycycline; and Latex  Home Medications   Current Outpatient Rx  Name  Route  Sig  Dispense  Refill  . albuterol (PROVENTIL HFA;VENTOLIN HFA) 108 (90 BASE) MCG/ACT inhaler   Inhalation   Inhale 2 puffs into the lungs 4 (four) times daily as needed for wheezing.   1 Inhaler   0   . ibuprofen (ADVIL,MOTRIN) 200 MG tablet   Oral   Take 400 mg by mouth every 6 (six) hours as needed for pain.         Marland Kitchen levonorgestrel (MIRENA) 20 MCG/24HR IUD   Intrauterine   1 each by Intrauterine route once.         . Pseudoephedrine-Acetaminophen (NASAL DECONGESTANT SINUS PO)   Oral   Take 2 tablets by mouth every 6 (six) hours as needed. Sinus congestion         . sertraline (ZOLOFT) 50  MG tablet   Oral   Take 1 tablet (50 mg total) by mouth daily.   30 tablet   2    BP 118/66  Pulse 77  Temp(Src) 97.8 F (36.6 C) (Oral)  Resp 20  Ht 5\' 4"  (1.626 m)  Wt 180 lb (81.647 kg)  BMI 30.88 kg/m2  SpO2 100%  LMP 10/12/2013 Physical Exam  Nursing note and vitals reviewed. Constitutional: She is oriented to person, place, and time. She appears well-developed and well-nourished. No distress.  HENT:  Head: Normocephalic and atraumatic.  Mouth/Throat: Uvula is midline, oropharynx is clear and moist and mucous membranes are normal. No uvula swelling. No oropharyngeal exudate.  Moderate erythema of the right  TM tympanostomy tube appears well positioned and w/o drainage .  No edema of the ear canal  Eyes: Conjunctivae are normal. Pupils are equal, round, and reactive to light.  Neck: Normal range of motion. Neck supple.  Cardiovascular: Normal rate, regular rhythm, normal heart sounds and intact distal pulses.   No murmur heard. Pulmonary/Chest: Effort normal and breath sounds normal. No stridor. No respiratory distress.  Musculoskeletal: Normal range of motion.  Lymphadenopathy:    She has no cervical adenopathy.  Neurological: She is alert and oriented to person, place, and time. Coordination normal.  Skin: Skin is warm and dry. No rash noted.    ED Course  Procedures (including critical care time) Labs Review Labs Reviewed - No data to display Imaging Review No results found.  EKG Interpretation   None       MDM   Patient is well appearing, VSS.  Has previously placed tympanostomy tube to right ear only with moderate erythema of the TM.  No drainage present or edema of the canal.  Pt appears stable for d/c.  I will prescribe z-pack and advised her to f/u with her ENT.  Pt agrees to plan  Elizaveta Mattice L. Trisha Mangle, PA-C 10/26/13 1033

## 2013-10-26 NOTE — ED Provider Notes (Signed)
Medical screening examination/treatment/procedure(s) were performed by non-physician practitioner and as supervising physician I was immediately available for consultation/collaboration.     Geoffery Lyons, MD 10/26/13 [phone]

## 2013-10-26 NOTE — ED Notes (Signed)
complain of pain in right ear that started last night

## 2013-11-28 ENCOUNTER — Ambulatory Visit (HOSPITAL_COMMUNITY)
Admission: RE | Admit: 2013-11-28 | Discharge: 2013-11-28 | Disposition: A | Discharge status: Home / Self Care | Payer: Medicaid Other | Source: Ambulatory Visit | Attending: Family Medicine | Admitting: Family Medicine

## 2013-11-28 DIAGNOSIS — M545 Low back pain, unspecified: Secondary | ICD-10-CM | POA: Insufficient documentation

## 2013-11-28 DIAGNOSIS — IMO0001 Reserved for inherently not codable concepts without codable children: Secondary | ICD-10-CM | POA: Insufficient documentation

## 2013-11-28 DIAGNOSIS — M79609 Pain in unspecified limb: Secondary | ICD-10-CM | POA: Insufficient documentation

## 2013-11-28 NOTE — Evaluation (Signed)
Physical Therapy Evaluation  Patient Details  Name: Jasmine Maynard MRN: [id number] Date of Birth: 12/21/1990  Today's Date: 11/28/2013 Time: 800 - 845                Visit#: 1 of 1   Authorization: medicaid     Past Medical History:  Past Medical History  Diagnosis Date  . DDD (degenerative disc disease)   . Depression   . Herpes   . ADHD (attention deficit hyperactivity disorder)   . Perennial allergic rhinitis    Past Surgical History:  Past Surgical History  Procedure Laterality Date  . Intrauterine device insertion  2011  . Myringotomy  2011    right  . Cholecystectomy  02/13/2012    Procedure: LAPAROSCOPIC CHOLECYSTECTOMY;  Surgeon: Fabio BeringBrent C Ziegler, MD;  Location: [facility];  Service: General;  Laterality: N/A;    Subjective Symptoms/Limitations Symptoms: [name] states that her back has been bothering her since she was [age].  She states that she has pain going down her Lt leg that is intermittent.   Pertinent History: MRI is basically negative. How long can you sit comfortably?: Sitting aggrevates her leg and back after five minutes. How long can you stand comfortably?: Pt has pain after standing for about 15 minutes How long can you walk comfortably?: Pt is able to walk for 30 minutes before she feels the need to sit down. Pain Assessment Currently in Pain?: Yes Pain Location: Back Pain Orientation: Left   Assessment Lumbar AROM Lumbar Flexion: wnl with returning causing more pain. Lumbar Extension: wfl with improved sx Lumbar - Right Side Bend: wfl with improved sx Lumbar - Left Side Bend: wfl with increased sx  Lumbar - Right Rotation: wfl Lumbar - Left Rotation: wfl  Exercise/Treatments     Supine Ab Set: 10 reps Clam: 10 reps Bent Knee Raise: 10 reps Bridge: 10 reps Straight Leg Raise: 10 reps Isometric Hip Flexion: 10 reps Other Supine Lumbar Exercises: kegal x 10 Physical Therapy Assessment and Plan PT Assessment and Plan Clinical Impression  Statement: Pt  is a 23 yo female referred for back exercises.  Exam shows instability of lumbar due to weak core mm . PT Plan: one time visit for core exercises    Goals Home Exercise Program Pt/caregiver will Perform Home Exercise Program: For increased strengthening  Problem List Patient Active Problem List   Diagnosis Date Noted  . 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  . Chronic low back pain 09/17/2013  . Depression with anxiety 02/11/2013  . ADHD (attention deficit hyperactivity disorder) 02/11/2013       GP    Jasmine Maynard,Jasmine Maynard 11/28/2013, 12:16 PM  Physician Documentation Your signature is required to indicate approval of the treatment plan as stated above.  Please sign and either send electronically or make a copy of this report for your files and return this physician signed original.   Please mark one 1.__approve of plan  2. ___approve of plan with the following conditions.   ______________________________                                                          _____________________ Physician Signature  Date  

## 2014-02-17 ENCOUNTER — Encounter (HOSPITAL_COMMUNITY): Payer: Self-pay | Admitting: Emergency Medicine

## 2014-02-17 ENCOUNTER — Emergency Department (HOSPITAL_COMMUNITY): Payer: Medicaid Other

## 2014-02-17 ENCOUNTER — Emergency Department (HOSPITAL_COMMUNITY)
Admission: EM | Admit: 2014-02-17 | Discharge: 2014-02-17 | Disposition: A | Discharge status: Home / Self Care | Payer: Medicaid Other | Attending: Emergency Medicine | Admitting: Emergency Medicine

## 2014-02-17 DIAGNOSIS — S4980XA Other specified injuries of shoulder and upper arm, unspecified arm, initial encounter: Secondary | ICD-10-CM | POA: Insufficient documentation

## 2014-02-17 DIAGNOSIS — Y929 Unspecified place or not applicable: Secondary | ICD-10-CM | POA: Insufficient documentation

## 2014-02-17 DIAGNOSIS — Z3202 Encounter for pregnancy test, result negative: Secondary | ICD-10-CM | POA: Insufficient documentation

## 2014-02-17 DIAGNOSIS — Z91040 Latex allergy status: Secondary | ICD-10-CM | POA: Insufficient documentation

## 2014-02-17 DIAGNOSIS — Z8619 Personal history of other infectious and parasitic diseases: Secondary | ICD-10-CM | POA: Insufficient documentation

## 2014-02-17 DIAGNOSIS — Z792 Long term (current) use of antibiotics: Secondary | ICD-10-CM | POA: Insufficient documentation

## 2014-02-17 DIAGNOSIS — Y9383 Activity, rough housing and horseplay: Secondary | ICD-10-CM | POA: Insufficient documentation

## 2014-02-17 DIAGNOSIS — Z8739 Personal history of other diseases of the musculoskeletal system and connective tissue: Secondary | ICD-10-CM | POA: Insufficient documentation

## 2014-02-17 DIAGNOSIS — F3289 Other specified depressive episodes: Secondary | ICD-10-CM | POA: Insufficient documentation

## 2014-02-17 DIAGNOSIS — W1809XA Striking against other object with subsequent fall, initial encounter: Secondary | ICD-10-CM | POA: Insufficient documentation

## 2014-02-17 DIAGNOSIS — Z8709 Personal history of other diseases of the respiratory system: Secondary | ICD-10-CM | POA: Insufficient documentation

## 2014-02-17 DIAGNOSIS — S139XXA Sprain of joints and ligaments of unspecified parts of neck, initial encounter: Secondary | ICD-10-CM | POA: Insufficient documentation

## 2014-02-17 DIAGNOSIS — Z79899 Other long term (current) drug therapy: Secondary | ICD-10-CM | POA: Insufficient documentation

## 2014-02-17 DIAGNOSIS — S46909A Unspecified injury of unspecified muscle, fascia and tendon at shoulder and upper arm level, unspecified arm, initial encounter: Secondary | ICD-10-CM | POA: Insufficient documentation

## 2014-02-17 DIAGNOSIS — W19XXXA Unspecified fall, initial encounter: Secondary | ICD-10-CM

## 2014-02-17 DIAGNOSIS — F329 Major depressive disorder, single episode, unspecified: Secondary | ICD-10-CM | POA: Insufficient documentation

## 2014-02-17 DIAGNOSIS — S161XXA Strain of muscle, fascia and tendon at neck level, initial encounter: Secondary | ICD-10-CM

## 2014-02-17 LAB — PREGNANCY, URINE: PREG TEST UR: NEGATIVE

## 2014-02-17 MED ORDER — HYDROCODONE-ACETAMINOPHEN 5-325 MG PO TABS
2.0000 | ORAL_TABLET | Freq: Once | ORAL | Status: AC
  Administered 2014-02-17: 2 via ORAL
  Filled 2014-02-17: qty 2

## 2014-02-17 MED ORDER — ONDANSETRON 4 MG PO TBDP
4.0000 mg | ORAL_TABLET | Freq: Once | ORAL | Status: AC
  Administered 2014-02-17: 4 mg via ORAL
  Filled 2014-02-17: qty 1

## 2014-02-17 NOTE — ED Notes (Addendum)
Patient also complains of dizziness and nausea and increasing pain when she tries to turn neck.

## 2014-02-17 NOTE — ED Notes (Signed)
Arrived easily ambulatory to room with c-collar in place

## 2014-02-17 NOTE — ED Notes (Signed)
Patient reports hit head on floor when wrestling with significant other. Complaining of pain to head, neck, left ear, and left shoulder.

## 2014-02-17 NOTE — Discharge Instructions (Signed)
Cervical Sprain °A cervical sprain is an injury in the neck in which the strong, fibrous tissues (ligaments) that connect your neck bones stretch or tear. Cervical sprains can range from mild to severe. Severe cervical sprains can cause the neck vertebrae to be unstable. This can lead to damage of the spinal cord and can result in serious nervous system problems. The amount of time it takes for a cervical sprain to get better depends on the cause and extent of the injury. Most cervical sprains heal in 1 to 3 weeks. °CAUSES  °Severe cervical sprains may be caused by:  °· Contact sport injuries (such as from football, rugby, wrestling, hockey, auto racing, gymnastics, diving, martial arts, or boxing).   °· Motor vehicle collisions.   °· Whiplash injuries. This is an injury from a sudden forward-and backward whipping movement of the head and neck.  °· Falls.   °Mild cervical sprains may be caused by:  °· Being in an awkward position, such as while cradling a telephone between your ear and shoulder.   °· Sitting in a chair that does not offer proper support.   °· Working at a poorly designed computer station.   °· Looking up or down for long periods of time.   °SYMPTOMS  °· Pain, soreness, stiffness, or a burning sensation in the front, back, or sides of the neck. This discomfort may develop immediately after the injury or slowly, 24 hours or more after the injury.   °· Pain or tenderness directly in the middle of the back of the neck.   °· Shoulder or upper back pain.   °· Limited ability to move the neck.   °· Headache.   °· Dizziness.   °· Weakness, numbness, or tingling in the hands or arms.   °· Muscle spasms.   °· Difficulty swallowing or chewing.   °· Tenderness and swelling of the neck.   °DIAGNOSIS  °Most of the time your health care provider can diagnose a cervical sprain by taking your history and doing a physical exam. Your health care provider will ask about previous neck injuries and any known neck  problems, such as arthritis in the neck. X-rays may be taken to find out if there are any other problems, such as with the bones of the neck. Other tests, such as a CT scan or MRI, may also be needed.  °TREATMENT  °Treatment depends on the severity of the cervical sprain. Mild sprains can be treated with rest, keeping the neck in place (immobilization), and pain medicines. Severe cervical sprains are immediately immobilized. Further treatment is done to help with pain, muscle spasms, and other symptoms and may include: °· Medicines, such as pain relievers, numbing medicines, or muscle relaxants.   °· Physical therapy. This may involve stretching exercises, strengthening exercises, and posture training. Exercises and improved posture can help stabilize the neck, strengthen muscles, and help stop symptoms from returning.   °HOME CARE INSTRUCTIONS  °· Put ice on the injured area.   °· Put ice in a plastic bag.   °· Place a towel between your skin and the bag.   °· Leave the ice on for 15 20 minutes, 3 4 times a day.   °· If your injury was severe, you may have been given a cervical collar to wear. A cervical collar is a two-piece collar designed to keep your neck from moving while it heals. °· Do not remove the collar unless instructed by your health care provider. °· If you have long hair, keep it outside of the collar. °· Ask your health care provider before making any adjustments to your collar.   Minor adjustments may be required over time to improve comfort and reduce pressure on your chin or on the back of your head. °· If you are allowed to remove the collar for cleaning or bathing, follow your health care provider's instructions on how to do so safely. °· Keep your collar clean by wiping it with mild soap and water and drying it completely. If the collar you have been given includes removable pads, remove them every 1 2 days and hand wash them with soap and water. Allow them to air dry. They should be completely  dry before you wear them in the collar. °· If you are allowed to remove the collar for cleaning and bathing, wash and dry the skin of your neck. Check your skin for irritation or sores. If you see any, tell your health care provider. °· Do not drive while wearing the collar.   °· Only take over-the-counter or prescription medicines for pain, discomfort, or fever as directed by your health care provider.   °· Keep all follow-up appointments as directed by your health care provider.   °· Keep all physical therapy appointments as directed by your health care provider.   °· Make any needed adjustments to your workstation to promote good posture.   °· Avoid positions and activities that make your symptoms worse.   °· Warm up and stretch before being active to help prevent problems.   °SEEK MEDICAL CARE IF:  °· Your pain is not controlled with medicine.   °· You are unable to decrease your pain medicine over time as planned.   °· Your activity level is not improving as expected.   °SEEK IMMEDIATE MEDICAL CARE IF:  °· You develop any bleeding. °· You develop stomach upset. °· You have signs of an allergic reaction to your medicine.   °· Your symptoms get worse.   °· You develop new, unexplained symptoms.   °· You have numbness, tingling, weakness, or paralysis in any part of your body.   °MAKE SURE YOU:  °· Understand these instructions. °· Will watch your condition. °· Will get help right away if you are not doing well or get worse. °Document Released: 09/04/2007 Document Revised: 08/28/2013 Document Reviewed: 05/15/2013 °[employer]® Patient Information ©2014 [employer]. ° °Concussion, Adult °A concussion, or closed-head injury, is a brain injury caused by a direct blow to the head or by a quick and sudden movement (jolt) of the head or neck. Concussions are usually not life-threatening. Even so, the effects of a concussion can be serious. If you have had a concussion before, you are more likely to experience  concussion-like symptoms after a direct blow to the head.  °CAUSES  °· Direct blow to the head, such as from running into another player during a soccer game, being hit in a fight, or hitting your head on a hard surface. °· A jolt of the head or neck that causes the brain to move back and forth inside the skull, such as in a car crash. °SIGNS AND SYMPTOMS  °The signs of a concussion can be hard to notice. Early on, they may be missed by you, family members, and health care providers. You may look fine but act or feel differently. °Symptoms are usually temporary, but they may last for days, weeks, or even longer. Some symptoms may appear right away while others may not show up for hours or days. Every head injury is different. Symptoms include:  °· Mild to moderate headaches that will not go away. °· A feeling of pressure inside your head.  °· Having more trouble   than usual:   °· Learning or remembering things you have heard. °· Answering questions.  °· Paying attention or concentrating.   °· Organizing daily tasks.   °· Making decisions and solving problems.   °· Slowness in thinking, acting or reacting, speaking, or reading.   °· Getting lost or being easily confused.   °· Feeling tired all the time or lacking energy (fatigued).   °· Feeling drowsy.   °· Sleep disturbances.   °· Sleeping more than usual.   °· Sleeping less than usual.   °· Trouble falling asleep.   °· Trouble sleeping (insomnia).   °· Loss of balance or feeling lightheaded or dizzy.   °· Nausea or vomiting.   °· Numbness or tingling.   °· Increased sensitivity to:   °· Sounds.   °· Lights.   °· Distractions.   °· Vision problems or eyes that tire easily.   °· Diminished sense of taste or smell.   °· Ringing in the ears.   °· Mood changes such as feeling sad or anxious.   °· Becoming easily irritated or angry for little or no reason.   °· Lack of motivation. °· Seeing or hearing things other people do not see or hear (hallucinations). °DIAGNOSIS    °Your health care provider can usually diagnose a concussion based on a description of your injury and symptoms. He or she will ask whether you passed out (lost consciousness) and whether you are having trouble remembering events that happened right before and during your injury.  °Your evaluation might include:  °· A brain scan to look for signs of injury to the brain. Even if the test shows no injury, you may still have a concussion.   °· Blood tests to be sure other problems are not present. °TREATMENT  °· Concussions are usually treated in an emergency department, in urgent care, or at a clinic. You may need to stay in the hospital overnight for further treatment.   °· Tell your health care provider if you are taking any medicines, including prescription medicines, over-the-counter medicines, and natural remedies. Some medicines, such as blood thinners (anticoagulants) and aspirin, may increase the chance of complications. Also tell your health care provider whether you have had alcohol or are taking illegal drugs. This information may affect treatment. °· Your health care provider will send you home with important instructions to follow. °· How fast you will recover from a concussion depends on many factors. These factors include how severe your concussion is, what part of your brain was injured, your age, and how healthy you were before the concussion. °· Most people with mild injuries recover fully. Recovery can take time. In general, recovery is slower in older persons. Also, persons who have had a concussion in the past or have other medical problems may find that it takes longer to recover from their current injury. °HOME CARE INSTRUCTIONS  °General Instructions °· Carefully follow the directions your health care provider gave you. °· Only take over-the-counter or prescription medicines for pain, discomfort, or fever as directed by your health care provider. °· Take only those medicines that your health  care provider has approved. °· Do not drink alcohol until your health care provider says you are well enough to do so. Alcohol and certain other drugs may slow your recovery and can put you at risk of further injury. °· If it is harder than usual to remember things, write them down. °· If you are easily distracted, try to do one thing at a time. For example, do not try to watch TV while fixing dinner. °· Talk with family members or close friends when making   important decisions. °· Keep all follow-up appointments. Repeated evaluation of your symptoms is recommended for your recovery. °· Watch your symptoms and tell others to do the same. Complications sometimes occur after a concussion. Older adults with a brain injury may have a higher risk of serious complications such as of a blood clot on the brain. °· Tell your teachers, school nurse, school counselor, coach, athletic trainer, or work manager about your injury, symptoms, and restrictions. Tell them about what you can or cannot do. They should watch for:   °· Increased problems with attention or concentration.   °· Increased difficulty remembering or learning new information.   °· Increased time needed to complete tasks or assignments.   °· Increased irritability or decreased ability to cope with stress.   °· Increased symptoms.   °· Rest. Rest helps the brain to heal. Make sure you: °· Get plenty of sleep at night. Avoid staying up late at night. °· Keep the same bedtime hours on weekends and weekdays. °· Rest during the day. Take daytime naps or rest breaks when you feel tired. °· Limit activities that require a lot of thought or concentration. These includes   °· Doing homework or job-related work.   °· Watching TV.   °· Working on the computer. °· Avoid any situation where there is potential for another head injury (football, hockey, soccer, basketball, martial arts, downhill snow sports and horseback riding). Your condition will get worse every time you  experience a concussion. You should avoid these activities until you are evaluated by the appropriate follow-up caregivers. °Returning To Your Regular Activities °You will need to return to your normal activities slowly, not all at once. You must give your body and brain enough time for recovery. °· Do not return to sports or other athletic activities until your health care provider tells you it is safe to do so. °· Ask your health care provider when you can drive, ride a bicycle, or operate heavy machinery. Your ability to react may be slower after a brain injury. Never do these activities if you are dizzy. °· Ask your health care provider about when you can return to work or school. °Preventing Another Concussion °It is very important to avoid another brain injury, especially before you have recovered. In rare cases, another injury can lead to permanent brain damage, brain swelling, or death. The risk of this is greatest during the first 7 10 days after a head injury. Avoid injuries by:  °· Wearing a seat belt when riding in a car.   °· Drinking alcohol only in moderation.   °· Wearing a helmet when biking, skiing, skateboarding, skating, or doing similar activities. °· Avoiding activities that could lead to a second concussion, such as contact or recreational sports, until your health care provider says it is OK. °· Taking safety measures in your home.   °· Remove clutter and tripping hazards from floors and stairways.   °· Use grab bars in bathrooms and handrails by stairs.   °· Place non-slip mats on floors and in bathtubs.   °· Improve lighting in dim areas. °SEEK MEDICAL CARE IF:  °· You have increased problems paying attention or concentrating.   °· You have increased difficulty remembering or learning new information.   °· You need more time to complete tasks or assignments than before.   °· You have increased irritability or decreased ability to cope with stress. °· You have more symptoms than  before. °Seek medical care if you have any of the following symptoms for more than 2 weeks after your injury:  °·   Lasting (chronic) headaches.   °· Dizziness or balance problems.   °· Nausea. °· Vision problems.   °· Increased sensitivity to noise or light.   °· Depression or mood swings.   °· Anxiety or irritability.   °· Memory problems.   °· Difficulty concentrating or paying attention.   °· Sleep problems.   °· Feeling tired all the time. °SEEK IMMEDIATE MEDICAL CARE IF:  °· You have severe or worsening headaches. These may be a sign of a blood clot in the brain. °· You have weakness (even if only in one hand, leg, or part of the face). °· You have numbness. °· You have decreased coordination.   °· You vomit repeatedly.  °· You have increased sleepiness. °· One pupil is larger than the other.   °· You have convulsions.   °· You have slurred speech.   °· You have increased confusion. This may be a sign of a blood clot in the brain. °· You have increased restlessness, agitation, or irritability.   °· You are unable to recognize people or places.   °· You have neck pain.   °· It is difficult to wake you up.   °· You have unusual behavior changes.   °· You lose consciousness. °MAKE SURE YOU:  °· Understand these instructions. °· Will watch your condition. °· Will get help right away if you are not doing well or get worse. °Document Released: 01/28/2004 Document Revised: 07/10/2013 Document Reviewed: 05/30/2013 °[employer]® Patient Information ©2014 [employer]. ° °

## 2014-02-17 NOTE — ED Provider Notes (Signed)
CSN: [id number]     Arrival date & time 02/17/14  1938 History   First MD Initiated Contact with Patient 02/17/14 2137     Chief Complaint  Patient presents with  . Head Injury     (Consider location/radiation/quality/duration/timing/severity/associated sxs/prior Treatment) HPI Comments: Patient presents emergency department with chief complaint of fall. She states that she fell from her husband shoulders, and that her neck and head. She is complaining of neck pain, as well as left sided shoulder pain. She states the pain is 8/10. She denies loss of consciousness. She denies weakness or numbness in her arms or in her legs. She denies any vomiting.  The history is provided by the patient. No language interpreter was used.    Past Medical History  Diagnosis Date  . DDD (degenerative disc disease)   . Depression   . Herpes   . ADHD (attention deficit hyperactivity disorder)   . Perennial allergic rhinitis    Past Surgical History  Procedure Laterality Date  . Intrauterine device insertion  2011  . Myringotomy  2011    right  . Cholecystectomy  02/13/2012    Procedure: LAPAROSCOPIC CHOLECYSTECTOMY;  Surgeon: Fabio BeringBrent C Ziegler, MD;  Location: [facility];  Service: General;  Laterality: N/A;   History reviewed. No pertinent family history. History  Substance Use Topics  . Smoking status: Never Smoker   . Smokeless tobacco: Never Used  . Alcohol Use: No   OB History   Grav Para Term Preterm Abortions TAB SAB Ect Mult Living                 Review of Systems  Constitutional: Negative for fever and chills.  Respiratory: Negative for shortness of breath.   Cardiovascular: Negative for chest pain.  Gastrointestinal: Negative for nausea, vomiting, diarrhea and constipation.  Genitourinary: Negative for dysuria.      Allergies  Amoxicillin; Avelox; Doxycycline; and Latex  Home Medications   Current Outpatient Rx  Name  Route  Sig  Dispense  Refill  . albuterol (PROVENTIL  HFA;VENTOLIN HFA) 108 (90 BASE) MCG/ACT inhaler   Inhalation   Inhale 2 puffs into the lungs 4 (four) times daily as needed for wheezing.   1 Inhaler   0   . azithromycin (ZITHROMAX Z-PAK) 250 MG tablet      Take two tablets on day one, then one tab qd days 2-5   6 tablet   0   . levonorgestrel (MIRENA) 20 MCG/24HR IUD   Intrauterine   1 each by Intrauterine route once.         . Pseudoephedrine-Ibuprofen (ADVIL COLD/SINUS PO)   Oral   Take 1 tablet by mouth every 4 (four) hours as needed (sinus congestion).         . sertraline (ZOLOFT) 50 MG tablet   Oral   Take 1 tablet (50 mg total) by mouth daily.   30 tablet   2   . traMADol (ULTRAM) 50 MG tablet   Oral   Take 1 tablet (50 mg total) by mouth every 6 (six) hours as needed.   10 tablet   0    BP 132/76  Pulse 72  Temp(Src) 98.3 F (36.8 C) (Oral)  Resp 24  Ht 5\' 4"  (1.626 m)  Wt 175 lb (79.379 kg)  BMI 30.02 kg/m2  SpO2 100% Physical Exam  Nursing note and vitals reviewed. Constitutional: She is oriented to person, place, and time. She appears well-developed and well-nourished.  HENT:  Head: Normocephalic  and atraumatic.  Eyes: Conjunctivae and EOM are normal. Pupils are equal, round, and reactive to light.  Neck: Normal range of motion. Neck supple.  Cardiovascular: Normal rate and regular rhythm.  Exam reveals no gallop and no friction rub.   No murmur heard. Pulmonary/Chest: Effort normal and breath sounds normal. No respiratory distress. She has no wheezes. She has no rales. She exhibits no tenderness.  Abdominal: Soft. Bowel sounds are normal. She exhibits no distension and no mass. There is no tenderness. There is no rebound and no guarding.  Musculoskeletal: Normal range of motion. She exhibits no edema and no tenderness.  Cervical spine moderately tender to palpation, patient is in a collar Upper and lower extremity range of motion 5/5, strength 5/5  Neurological: She is alert and oriented to  person, place, and time.  Sensation and strength intact  Skin: Skin is warm and dry.  Psychiatric: She has a normal mood and affect. Her behavior is normal. Judgment and thought content normal.    ED Course  Procedures (including critical care time) Labs Review Labs Reviewed  PREGNANCY, URINE   Imaging Review Dg Cervical Spine Complete  02/17/2014   CLINICAL DATA:  Wrestling injury, pain to head and neck, left shoulder.  EXAM: CERVICAL SPINE  4+ VIEWS  COMPARISON:  None available for comparison at time of study interpretation.  FINDINGS: Cervical vertebral bodies and posterior elements appear intact and aligned to the inferior endplate of C7, the most caudal well visualized level. Straightened cervical lordosis. Intervertebral disc heights preserved. No destructive bony lesions. No neural foraminal narrowing. Lateral masses in alignment. Prevertebral and paraspinal soft tissue planes are nonsuspicious.  IMPRESSION: Negative cervical spine radiographs.   Electronically Signed   By: Awilda Metro   On: 02/17/2014 23:00   Dg Shoulder Left  02/17/2014   CLINICAL DATA:  Fall.  Left shoulder pain.  EXAM: LEFT SHOULDER - 2+ VIEW  COMPARISON:  None.  FINDINGS: There is no evidence of fracture or dislocation. There is no evidence of arthropathy or other focal bone abnormality.  IMPRESSION: Negative.   Electronically Signed   By: Tiburcio Pea M.D.   On: 02/17/2014 23:05     EKG Interpretation None      MDM   Final diagnoses:  Cervical strain  Fall    Patient with mechanical fall. No indication for imaging of the head based on [place] head CT rules. C-spine cannot be cleared using nexus, therefore will obtain plain films. Will treat pain, and will reevaluate. Doubt any fracture, and anticipate discharge to home.  Despite a negative cervical spine x-ray, the patient still has some lower midline cervical spine tenderness. I cannot rule out ligamentous injury, therefore will discharge the  patient in an [place] collar. Recommend followup with primary care in 5-7 days. Patient understands and agrees with the plan.    Roxy Horseman, PA-C 02/17/14 2321

## 2014-02-18 NOTE — ED Provider Notes (Signed)
Medical screening examination/treatment/procedure(s) were performed by non-physician practitioner and as supervising physician I was immediately available for consultation/collaboration.  Flint MelterElliott L Shanette Tamargo, MD 02/18/14 [phone]

## 2014-02-27 ENCOUNTER — Encounter: Payer: Self-pay | Admitting: Family Medicine

## 2014-02-27 ENCOUNTER — Ambulatory Visit (INDEPENDENT_AMBULATORY_CARE_PROVIDER_SITE_OTHER): Payer: Medicaid Other | Admitting: Family Medicine

## 2014-02-27 VITALS — BP 118/78 | Temp 98.9°F | Ht 64.0 in | Wt 188.0 lb

## 2014-02-27 DIAGNOSIS — S139XXA Sprain of joints and ligaments of unspecified parts of neck, initial encounter: Secondary | ICD-10-CM

## 2014-02-27 DIAGNOSIS — J329 Chronic sinusitis, unspecified: Secondary | ICD-10-CM

## 2014-02-27 DIAGNOSIS — S161XXA Strain of muscle, fascia and tendon at neck level, initial encounter: Secondary | ICD-10-CM

## 2014-02-27 MED ORDER — CEFDINIR 300 MG PO CAPS: 300.0000 mg | ORAL_CAPSULE | Freq: Two times a day (BID) | ORAL | Status: DC

## 2014-02-27 NOTE — Progress Notes (Signed)
   Subjective:    Patient ID: Jasmine Maynard, female    DOB: 10/26/1991, 23 y.o.   MRN: [id number]  HPIFollow up from ED. Had a fall on March 30th. Still having neck pain and left shoulder pain. Pain is better.  Taking ibuprofen as needed.   Cough, nasal congestion, headache, sore throat from drainage, and ear pain. Symptoms started about 1 week ago.   Neck pain uite a bit better. Still feels tightness at times. Primarily left lateral neck. Minimal radiation into left arm.  Coughed for a couple weeks. Now productive of greenish phlegm. Frontal headache. Nasal discharge possible low-grade fever.  Emergency room that reviewed at length and presents the patient. X-rays negative.   Review of Systems No chest pain no back pain no abdominal pain no change about habits no rash no blood in stool ROS otherwise negative    Objective:   Physical Exam   alert mild malaise. HEENT moderate nasal congestion. Frontal tenderness pharynx erythematous neck supple left lateral neck tenderness to deep palpation heart regular in rhythm.good range of mtion shoulderImpression 1 rhinosinusitis #2 lateral neck strain secondary to injury clinically improving. All notes reviewed. Plan 25 minutes spent most in discussion. Plan initiate antibiotics. Hold off on further workup. See above good range of motion lungs clear      Assessment & Plan:

## 2014-03-05 ENCOUNTER — Ambulatory Visit: Payer: Medicaid Other | Admitting: Advanced Practice Midwife

## 2014-03-11 ENCOUNTER — Ambulatory Visit (INDEPENDENT_AMBULATORY_CARE_PROVIDER_SITE_OTHER): Payer: Medicaid Other | Admitting: Advanced Practice Midwife

## 2014-03-11 ENCOUNTER — Encounter: Payer: Self-pay | Admitting: Advanced Practice Midwife

## 2014-03-11 VITALS — BP 124/76 | Ht 64.0 in | Wt 186.0 lb

## 2014-03-11 DIAGNOSIS — N938 Other specified abnormal uterine and vaginal bleeding: Secondary | ICD-10-CM

## 2014-03-11 DIAGNOSIS — N949 Unspecified condition associated with female genital organs and menstrual cycle: Secondary | ICD-10-CM

## 2014-03-11 NOTE — Progress Notes (Signed)
Nyhla L Easter 23 y.o.  Filed Vitals:   03/11/14 1025  BP: 124/76   Past Medical History  Diagnosis Date  . DDD (degenerative disc disease)   . Depression   . Herpes   . ADHD (attention deficit hyperactivity disorder)   . Perennial allergic rhinitis    Past Surgical History  Procedure Laterality Date  . Intrauterine device insertion  2011  . Myringotomy  2011    right  . Cholecystectomy  02/13/2012    Procedure: LAPAROSCOPIC CHOLECYSTECTOMY;  Surgeon: Fabio BeringBrent C Ziegler, MD;  Location: [facility];  Service: General;  Laterality: N/A;   family history is not on file. Current outpatient prescriptions:albuterol (PROVENTIL HFA;VENTOLIN HFA) 108 (90 BASE) MCG/ACT inhaler, Inhale 2 puffs into the lungs 4 (four) times daily as needed for wheezing., Disp: 1 Inhaler, Rfl: 0;  cefdinir (OMNICEF) 300 MG capsule, Take 1 capsule (300 mg total) by mouth 2 (two) times daily., Disp: 20 capsule, Rfl: 0;  ibuprofen (ADVIL,MOTRIN) 200 MG tablet, Take 200 mg by mouth every 6 (six) hours as needed., Disp: , Rfl:  levonorgestrel (MIRENA) 20 MCG/24HR IUD, 1 each by Intrauterine route once., Disp: , Rfl: ;  Ranitidine HCl (ZANTAC 75 PO), Take 1 tablet by mouth daily as needed (for acid reflux)., Disp: , Rfl: ;  sertraline (ZOLOFT) 50 MG tablet, Take 1 tablet (50 mg total) by mouth daily., Disp: 30 tablet, Rfl: 2;  loratadine (CLARITIN) 10 MG tablet, Take 10 mg by mouth daily as needed for allergies., Disp: , Rfl:  [DISCONTINUED] dicyclomine (BENTYL) 20 MG tablet, Take 1 tablet (20 mg total) by mouth every 6 (six) hours as needed (abdominal cramping)., Disp: 15 tablet, Rfl: 0  HPI:Has started having BTB 1-2x/month.  Mirena placed 09/2009.  Can't find strings  PE: SSE:  Strings not visible.  U/S shows proper placement of IUD  ASSESSMENT:  BTB on mirena.  Offered:  Nothing, Megace, replace IUD  PLAN: Pt plans another baby soon, so doesn't want to replace IUD.  Declined Megace.  Has never had a pap, so f/u soon for  that.

## 2014-03-19 ENCOUNTER — Encounter: Payer: Self-pay | Admitting: *Deleted

## 2014-03-19 ENCOUNTER — Other Ambulatory Visit: Payer: Medicaid Other | Admitting: Advanced Practice Midwife

## 2014-05-10 ENCOUNTER — Encounter (HOSPITAL_COMMUNITY): Payer: Self-pay | Admitting: Emergency Medicine

## 2014-05-10 ENCOUNTER — Emergency Department (HOSPITAL_COMMUNITY)
Admission: EM | Admit: 2014-05-10 | Discharge: 2014-05-10 | Disposition: A | Discharge status: Home / Self Care | Payer: Medicaid Other | Attending: Emergency Medicine | Admitting: Emergency Medicine

## 2014-05-10 DIAGNOSIS — Z8709 Personal history of other diseases of the respiratory system: Secondary | ICD-10-CM | POA: Insufficient documentation

## 2014-05-10 DIAGNOSIS — H6690 Otitis media, unspecified, unspecified ear: Secondary | ICD-10-CM | POA: Diagnosis not present

## 2014-05-10 DIAGNOSIS — Z880 Allergy status to penicillin: Secondary | ICD-10-CM | POA: Diagnosis not present

## 2014-05-10 DIAGNOSIS — H9209 Otalgia, unspecified ear: Secondary | ICD-10-CM | POA: Diagnosis present

## 2014-05-10 DIAGNOSIS — Z79899 Other long term (current) drug therapy: Secondary | ICD-10-CM | POA: Diagnosis not present

## 2014-05-10 DIAGNOSIS — Z91040 Latex allergy status: Secondary | ICD-10-CM | POA: Insufficient documentation

## 2014-05-10 DIAGNOSIS — F3289 Other specified depressive episodes: Secondary | ICD-10-CM | POA: Insufficient documentation

## 2014-05-10 DIAGNOSIS — F329 Major depressive disorder, single episode, unspecified: Secondary | ICD-10-CM | POA: Insufficient documentation

## 2014-05-10 DIAGNOSIS — Z8739 Personal history of other diseases of the musculoskeletal system and connective tissue: Secondary | ICD-10-CM | POA: Insufficient documentation

## 2014-05-10 DIAGNOSIS — H6691 Otitis media, unspecified, right ear: Secondary | ICD-10-CM

## 2014-05-10 DIAGNOSIS — Z8619 Personal history of other infectious and parasitic diseases: Secondary | ICD-10-CM | POA: Insufficient documentation

## 2014-05-10 MED ORDER — AMOXICILLIN-POT CLAVULANATE 875-125 MG PO TABS: 1.0000 | ORAL_TABLET | Freq: Two times a day (BID) | ORAL | Status: DC

## 2014-05-10 NOTE — ED Provider Notes (Signed)
CSN: [id number]     Arrival date & time 05/10/14  2122 History   None   This chart was scribed for Joya Gaskinsonald W Wickline, MD by Marica OtterNusrat Rahman, ED Scribe. This patient was seen in room [facility] and the patient's care was started at 11:07 PM.  Chief Complaint  Patient presents with  . Otalgia   The history is provided by the patient. No language interpreter was used.   HPI Comments: Jasmine Maynard is a 23 y.o. female who presents to the Emergency Department complaining of intermittent right ear pain onset 2-3 months ago. Pt rates her ear pain a 3 out of 10. Pt also complains of associated bloody discharge from the right ear onset today. Pt reports that myringotomy tubes were placed in her right ear approximately 4 years ago (placed by Dr. Suszanne Connerseoh). Pt also reports a number of episodes of otitis which was treated with Omnicef several times without success. Pt further reports that she has been on antibiotics in the past without much relief. Though pt notes she cannot recall the last time she was on antibiotics. Pt denies fever, nausea or vomiting.    Past Medical History  Diagnosis Date  . DDD (degenerative disc disease)   . Depression   . Herpes   . ADHD (attention deficit hyperactivity disorder)   . Perennial allergic rhinitis    Past Surgical History  Procedure Laterality Date  . Intrauterine device insertion  2011  . Myringotomy  2011    right  . Cholecystectomy  02/13/2012    Procedure: LAPAROSCOPIC CHOLECYSTECTOMY;  Surgeon: Fabio BeringBrent C Ziegler, MD;  Location: [facility];  Service: General;  Laterality: N/A;   History reviewed. No pertinent family history. History  Substance Use Topics  . Smoking status: Never Smoker   . Smokeless tobacco: Never Used  . Alcohol Use: No   OB History   Grav Para Term Preterm Abortions TAB SAB Ect Mult Living                 Review of Systems  Constitutional: Negative for fever.  HENT: Positive for ear discharge (blood drainage) and ear pain.         Myringotomy tubes placement on R ear  Gastrointestinal: Negative for nausea and vomiting.      Allergies  Amoxicillin; Avelox; Cefdinir; Doxycycline; and Latex  Home Medications   Prior to Admission medications   Medication Sig Start Date End Date Taking? Authorizing Provider  albuterol (PROVENTIL HFA;VENTOLIN HFA) 108 (90 BASE) MCG/ACT inhaler Inhale 2 puffs into the lungs 4 (four) times daily as needed for wheezing. 06/14/13   Merlyn AlbertWilliam S Luking, MD  cefdinir (OMNICEF) 300 MG capsule Take 1 capsule (300 mg total) by mouth 2 (two) times daily. 02/27/14   Merlyn AlbertWilliam S Luking, MD  ibuprofen (ADVIL,MOTRIN) 200 MG tablet Take 200 mg by mouth every 6 (six) hours as needed.    Historical Provider, MD  levonorgestrel (MIRENA) 20 MCG/24HR IUD 1 each by Intrauterine route once.    Historical Provider, MD  loratadine (CLARITIN) 10 MG tablet Take 10 mg by mouth daily as needed for allergies.    Historical Provider, MD  Ranitidine HCl (ZANTAC 75 PO) Take 1 tablet by mouth daily as needed (for acid reflux).    Historical Provider, MD  sertraline (ZOLOFT) 50 MG tablet Take 1 tablet (50 mg total) by mouth daily. 09/16/13 09/16/14  Campbell Richesarolyn C Hoskins, NP   Triage Vitals: BP 134/83  Pulse 85  Temp(Src) 98.5 F (36.9 C) (Oral)  Resp 16  Ht 5\' 4"  (1.626 m)  Wt 185 lb (83.915 kg)  BMI 31.74 kg/m2  SpO2 100% Physical Exam CONSTITUTIONAL: Well developed/well nourished HEAD: Normocephalic/atraumatic EYES: EOMI/PERRL ENMT: Mucous membranes moist; left TM clear; right TM intact with erythema, no discharge of blood noted in canal.  NECK: supple no meningeal signs CV: S1/S2 noted, no murmurs/rubs/gallops noted LUNGS: Lungs are clear to auscultation bilaterally, no apparent distress NEURO: Pt is awake/alert, moves all extremitiesx4 EXTREMITIES:full ROM SKIN: warm, color normal PSYCH: no abnormalities of mood noted  ED Course  Procedures  DIAGNOSTIC STUDIES: Oxygen Saturation is 100% on RA, normal by my  interpretation.    COORDINATION OF CARE: 11:08 PM-Discussed treatment plan which includes meds (including Augmentin) with pt at bedside and pt agreed to plan. Pt also advised to follow-up with Dr. Suszanne Connerseoh.   Pt denies actual allergy to amoxicillin Will prescribe augmentin  MDM   Final diagnoses:  Recurrent acute otitis media of right ear, unspecified otitis media type    Nursing notes including past medical history and social history reviewed and considered in documentation  I personally performed the services described in this documentation, which was scribed in my presence. The recorded information has been reviewed and is accurate.       Joya Gaskinsonald W Wickline, MD 05/11/14 [phone]

## 2014-05-10 NOTE — ED Notes (Signed)
Patient has had 2-3 months of intermittent R ear pain.  Has been on antibx in the past.  Today noticed bloody drainage from ear.  Myringotomy tubes placed 3 years ago.  Has had multiple bouts of otitis and has been on Omnicef multiple times and it has never worked.

## 2014-05-21 ENCOUNTER — Encounter (HOSPITAL_COMMUNITY): Payer: Self-pay | Admitting: Emergency Medicine

## 2014-05-21 ENCOUNTER — Emergency Department (HOSPITAL_COMMUNITY)
Admission: EM | Admit: 2014-05-21 | Discharge: 2014-05-21 | Disposition: A | Discharge status: Home / Self Care | Payer: Medicaid Other | Attending: Emergency Medicine | Admitting: Emergency Medicine

## 2014-05-21 ENCOUNTER — Emergency Department (HOSPITAL_COMMUNITY): Payer: Medicaid Other

## 2014-05-21 DIAGNOSIS — S93609A Unspecified sprain of unspecified foot, initial encounter: Secondary | ICD-10-CM | POA: Diagnosis not present

## 2014-05-21 DIAGNOSIS — S8990XA Unspecified injury of unspecified lower leg, initial encounter: Secondary | ICD-10-CM | POA: Diagnosis present

## 2014-05-21 DIAGNOSIS — Z8659 Personal history of other mental and behavioral disorders: Secondary | ICD-10-CM | POA: Insufficient documentation

## 2014-05-21 DIAGNOSIS — W172XXA Fall into hole, initial encounter: Secondary | ICD-10-CM | POA: Insufficient documentation

## 2014-05-21 DIAGNOSIS — X500XXA Overexertion from strenuous movement or load, initial encounter: Secondary | ICD-10-CM | POA: Diagnosis not present

## 2014-05-21 DIAGNOSIS — Z792 Long term (current) use of antibiotics: Secondary | ICD-10-CM | POA: Diagnosis not present

## 2014-05-21 DIAGNOSIS — S93602A Unspecified sprain of left foot, initial encounter: Secondary | ICD-10-CM

## 2014-05-21 DIAGNOSIS — S99919A Unspecified injury of unspecified ankle, initial encounter: Secondary | ICD-10-CM | POA: Diagnosis present

## 2014-05-21 DIAGNOSIS — Z8739 Personal history of other diseases of the musculoskeletal system and connective tissue: Secondary | ICD-10-CM | POA: Diagnosis not present

## 2014-05-21 DIAGNOSIS — Z91040 Latex allergy status: Secondary | ICD-10-CM | POA: Insufficient documentation

## 2014-05-21 DIAGNOSIS — Z8709 Personal history of other diseases of the respiratory system: Secondary | ICD-10-CM | POA: Insufficient documentation

## 2014-05-21 DIAGNOSIS — Y9389 Activity, other specified: Secondary | ICD-10-CM | POA: Insufficient documentation

## 2014-05-21 DIAGNOSIS — Z880 Allergy status to penicillin: Secondary | ICD-10-CM | POA: Diagnosis not present

## 2014-05-21 DIAGNOSIS — Z8619 Personal history of other infectious and parasitic diseases: Secondary | ICD-10-CM | POA: Diagnosis not present

## 2014-05-21 DIAGNOSIS — Y92009 Unspecified place in unspecified non-institutional (private) residence as the place of occurrence of the external cause: Secondary | ICD-10-CM | POA: Diagnosis not present

## 2014-05-21 MED ORDER — IBUPROFEN 600 MG PO TABS: 600.0000 mg | ORAL_TABLET | Freq: Four times a day (QID) | ORAL | Status: DC | PRN

## 2014-05-21 NOTE — Discharge Instructions (Signed)
Foot Sprain The muscles and cord like structures which attach muscle to bone (tendons) that surround the feet are made up of units. A foot sprain can occur at the weakest spot in any of these units. This condition is most often caused by injury to or overuse of the foot, as from playing contact sports, or aggravating a previous injury, or from poor conditioning, or obesity. SYMPTOMS  Pain with movement of the foot.  Tenderness and swelling at the injury site.  Loss of strength is present in moderate or severe sprains. THE THREE GRADES OR SEVERITY OF FOOT SPRAIN ARE:  Mild (Grade I): Slightly pulled muscle without tearing of muscle or tendon fibers or loss of strength.  Moderate (Grade II): Tearing of fibers in a muscle, tendon, or at the attachment to bone, with small decrease in strength.  Severe (Grade III): Rupture of the muscle-tendon-bone attachment, with separation of fibers. Severe sprain requires surgical repair. Often repeating (chronic) sprains are caused by overuse. Sudden (acute) sprains are caused by direct injury or over-use. DIAGNOSIS  Diagnosis of this condition is usually by your own observation. If problems continue, a caregiver may be required for further evaluation and treatment. X-rays may be required to make sure there are not breaks in the bones (fractures) present. Continued problems may require physical therapy for treatment. PREVENTION  Use strength and conditioning exercises appropriate for your sport.  Warm up properly prior to working out.  Use athletic shoes that are made for the sport you are participating in.  Allow adequate time for healing. Early return to activities makes repeat injury more likely, and can lead to an unstable arthritic foot that can result in prolonged disability. Mild sprains generally heal in 3 to 10 days, with moderate and severe sprains taking 2 to 10 weeks. Your caregiver can help you determine the proper time required for  healing. HOME CARE INSTRUCTIONS   Apply ice to the injury for 15-20 minutes, 03-04 times per day. Put the ice in a plastic bag and place a towel between the bag of ice and your skin.  An elastic wrap (like an Ace bandage) may be used to keep swelling down.  Keep foot above the level of the heart, or at least raised on a footstool, when swelling and pain are present.  Try to avoid use other than gentle range of motion while the foot is painful. Do not resume use until instructed by your caregiver. Then begin use gradually, not increasing use to the point of pain. If pain does develop, decrease use and continue the above measures, gradually increasing activities that do not cause discomfort, until you gradually achieve normal use.  Use crutches if and as instructed, and for the length of time instructed.  Keep injured foot and ankle wrapped between treatments.  Massage foot and ankle for comfort and to keep swelling down. Massage from the toes up towards the knee.  Only take over-the-counter or prescription medicines for pain, discomfort, or fever as directed by your caregiver. SEEK IMMEDIATE MEDICAL CARE IF:   Your pain and swelling increase, or pain is not controlled with medications.  You have loss of feeling in your foot or your foot turns cold or blue.  You develop new, unexplained symptoms, or an increase of the symptoms that brought you to your caregiver. MAKE SURE YOU:   Understand these instructions.  Will watch your condition.  Will get help right away if you are not doing well or get worse. Document Released:  04/29/2002 Document Revised: 01/30/2012 Document Reviewed: 06/26/2008 Exit[employer] Patient Information 2015 [employer]. This information is not intended to replace advice given to you by your health care provider. Make sure you discuss any questions you have with your health care provider.   Use ice and elevation as much as possible.  Weight bear as tolerated,   Using your crutches until your foot is more comfortable weight bearing.  Use the ibuprofen for pain relief.  See your doctor in 1 week if not improving, as discussed.

## 2014-05-21 NOTE — ED Provider Notes (Signed)
CSN: [id number]     Arrival date & time 05/21/14  1835 History   First MD Initiated Contact with Patient 05/21/14 1850     Chief Complaint  Patient presents with  . Foot Pain     (Consider location/radiation/quality/duration/timing/severity/associated sxs/prior Treatment) HPI   Jasmine Maynard is a 23 y.o. female presenting with left foot pain.  She stepped in a hole 45 minutes before arrival while working in her garden, twisted the left ankle, but only has pain across her foot instep and her great toe.  She has taken no medicines or treatment prior to arrival. Her pain is worse with weight bearing and movement.  She denies knee or hip pain.    Past Medical History  Diagnosis Date  . DDD (degenerative disc disease)   . Depression   . Herpes   . ADHD (attention deficit hyperactivity disorder)   . Perennial allergic rhinitis    Past Surgical History  Procedure Laterality Date  . Intrauterine device insertion  2011  . Myringotomy  2011    right  . Cholecystectomy  02/13/2012    Procedure: LAPAROSCOPIC CHOLECYSTECTOMY;  Surgeon: Fabio BeringBrent C Ziegler, MD;  Location: [facility];  Service: General;  Laterality: N/A;   Family History  Problem Relation Age of Onset  . Cancer Other    History  Substance Use Topics  . Smoking status: Never Smoker   . Smokeless tobacco: Never Used  . Alcohol Use: No   OB History   Grav Para Term Preterm Abortions TAB SAB Ect Mult Living   1 1 1       1      Review of Systems  Constitutional: Negative for fever.  Musculoskeletal: Positive for arthralgias and joint swelling. Negative for myalgias.  Neurological: Negative for weakness and numbness.      Allergies  Amoxicillin; Avelox; Cefdinir; Doxycycline; and Latex  Home Medications   Prior to Admission medications   Medication Sig Start Date End Date Taking? Authorizing Provider  amoxicillin-clavulanate (AUGMENTIN) 875-125 MG per tablet Take 1 tablet by mouth 2 (two) times daily. One po bid x 7  days 05/10/14  Yes Joya Gaskinsonald W Wickline, MD  ibuprofen (ADVIL,MOTRIN) 200 MG tablet Take 200 mg by mouth every 6 (six) hours as needed. pain   Yes Historical Provider, MD  ibuprofen (ADVIL,MOTRIN) 600 MG tablet Take 1 tablet (600 mg total) by mouth every 6 (six) hours as needed. 05/21/14   Burgess AmorJulie Kynsleigh Westendorf, PA-C  levonorgestrel (MIRENA) 20 MCG/24HR IUD 1 each by Intrauterine route once.    Historical Provider, MD   BP 116/65  Pulse 76  Temp(Src) 98.4 F (36.9 C) (Oral)  Resp 24  Ht 5\' 4"  (1.626 m)  Wt 180 lb (81.647 kg)  BMI 30.88 kg/m2  SpO2 100% Physical Exam  Constitutional: She appears well-developed and well-nourished.  HENT:  Head: Atraumatic.  Neck: Normal range of motion.  Cardiovascular:  Pulses:      Dorsalis pedis pulses are 2+ on the right side, and 2+ on the left side.  Pulses equal bilaterally  Musculoskeletal: She exhibits tenderness.       Left foot: She exhibits bony tenderness. She exhibits no swelling, normal capillary refill, no crepitus and no deformity.       Feet:  No appreciable edema or bruising.  Distal cap refill less than 2 seconds.   Neurological: She is alert. She has normal strength. She displays normal reflexes. No sensory deficit.  Skin: Skin is warm and dry.  Psychiatric: She has  a normal mood and affect.    ED Course  Procedures (including critical care time) Labs Review Labs Reviewed - No data to display  Imaging Review Dg Foot Complete Left  05/21/2014   CLINICAL DATA:  Foot pain  EXAM: LEFT FOOT - COMPLETE 3+ VIEW  COMPARISON:  None.  FINDINGS: Three views of the left foot submitted. No acute fracture or subluxation. No radiopaque foreign body.  IMPRESSION: Negative.   Electronically Signed   By: Natasha MeadLiviu  Pop M.D.   On: 05/21/2014 19:34     EKG Interpretation None      MDM   Final diagnoses:  Foot sprain, left, initial encounter    Patients labs and/or radiological studies were viewed and considered during the medical decision making and  disposition process. RICE,  Crutches provided.  Pt advised f/u with pcp in one week for recheck if not improving.    Burgess AmorJulie Artyom Stencel, PA-C 05/21/14 [phone]

## 2014-05-21 NOTE — ED Notes (Signed)
Patient c/o left foot pain. Per patient stepped in whole approx 45 minutess ago and now pain in top of foot. Per patient slight swelling.

## 2014-05-21 NOTE — ED Provider Notes (Signed)
Medical screening examination/treatment/procedure(s) were performed by non-physician practitioner and as supervising physician I was immediately available for consultation/collaboration.   EKG Interpretation None        Kristen N Ward, DO 05/21/14 2318 

## 2014-05-21 NOTE — ED Notes (Signed)
Ice pack applied to left foot per order.

## 2014-05-21 NOTE — ED Notes (Signed)
Pt verbalizes understanding of dc instructions, prescriptions, and follow up care. Patient demonstrated proper use of crutches.

## 2014-05-29 ENCOUNTER — Ambulatory Visit (INDEPENDENT_AMBULATORY_CARE_PROVIDER_SITE_OTHER): Payer: Medicaid Other | Admitting: Advanced Practice Midwife

## 2014-05-29 ENCOUNTER — Encounter: Payer: Self-pay | Admitting: Advanced Practice Midwife

## 2014-05-29 VITALS — BP 128/80 | Ht 65.0 in | Wt 200.0 lb

## 2014-05-29 DIAGNOSIS — Z30432 Encounter for removal of intrauterine contraceptive device: Secondary | ICD-10-CM

## 2014-05-29 MED ORDER — PRENATAL VITAMINS 0.8 MG PO TABS: 1.0000 | ORAL_TABLET | Freq: Every day | ORAL | Status: DC

## 2014-05-29 MED ORDER — VALACYCLOVIR HCL 500 MG PO TABS: 500.0000 mg | ORAL_TABLET | Freq: Every day | ORAL | Status: DC

## 2014-05-29 NOTE — Progress Notes (Signed)
  Delbert Phenixhyanna Satter 23 y.o.  Filed Vitals:   05/29/14 0842  BP: 128/80   Past Medical History  Diagnosis Date  . DDD (degenerative disc disease)   . Depression   . Herpes   . ADHD (attention deficit hyperactivity disorder)   . Perennial allergic rhinitis    Past Surgical History  Procedure Laterality Date  . Intrauterine device insertion  2011  . Myringotomy  2011    right  . Cholecystectomy  02/13/2012    Procedure: LAPAROSCOPIC CHOLECYSTECTOMY;  Surgeon: Fabio BeringBrent C Ziegler, MD;  Location: [facility];  Service: General;  Laterality: N/A;   family history includes Cancer in her other; Diabetes in her paternal grandfather and paternal grandmother. Current outpatient prescriptions:levonorgestrel (MIRENA) 20 MCG/24HR IUD, 1 each by Intrauterine route once., Disp: , Rfl: ;  Prenatal Multivit-Min-Fe-FA (PRENATAL VITAMINS) 0.8 MG tablet, Take 1 tablet by mouth daily., Disp: 30 tablet, Rfl: 12;  valACYclovir (VALTREX) 500 MG tablet, Take 1 tablet (500 mg total) by mouth daily., Disp: 30 tablet, Rfl: 11 [DISCONTINUED] dicyclomine (BENTYL) 20 MG tablet, Take 1 tablet (20 mg total) by mouth every 6 (six) hours as needed (abdominal cramping)., Disp: 15 tablet, Rfl: 0    Here for IUD removal.  She had the Mirena IUD placed 2010 and would like it removed to have another baby!  A graves speculum was placed, and the strings were not visible.  They were grasped with a curved Tresa EndoKelly and the IUD easily removed. Pt given a rx for PNV and daily valtrex d/t monthly outbreaks

## 2014-06-03 ENCOUNTER — Ambulatory Visit (INDEPENDENT_AMBULATORY_CARE_PROVIDER_SITE_OTHER): Payer: Medicaid Other | Admitting: Nurse Practitioner

## 2014-06-03 ENCOUNTER — Encounter: Payer: Self-pay | Admitting: Nurse Practitioner

## 2014-06-03 VITALS — BP 112/70 | Temp 99.0°F | Ht 64.0 in | Wt 195.1 lb

## 2014-06-03 DIAGNOSIS — H6500 Acute serous otitis media, unspecified ear: Secondary | ICD-10-CM

## 2014-06-03 DIAGNOSIS — J069 Acute upper respiratory infection, unspecified: Secondary | ICD-10-CM

## 2014-06-03 DIAGNOSIS — H65191 Other acute nonsuppurative otitis media, right ear: Secondary | ICD-10-CM

## 2014-06-03 DIAGNOSIS — B309 Viral conjunctivitis, unspecified: Secondary | ICD-10-CM

## 2014-06-03 DIAGNOSIS — H65199 Other acute nonsuppurative otitis media, unspecified ear: Secondary | ICD-10-CM

## 2014-06-03 DIAGNOSIS — H6505 Acute serous otitis media, recurrent, left ear: Secondary | ICD-10-CM

## 2014-06-03 MED ORDER — LEVOFLOXACIN 500 MG PO TABS: 500.0000 mg | ORAL_TABLET | Freq: Every day | ORAL | Status: DC

## 2014-06-06 ENCOUNTER — Encounter: Payer: Self-pay | Admitting: Nurse Practitioner

## 2014-06-06 NOTE — Progress Notes (Signed)
Subjective:  Presents for c/o right ear pain x 3 d. Feels like it is draining but no actual fluid. Has been to hospital x 2 for treatment. Has taken augmentin and omnicef with minmal relief. Describes a dull sensation in the right ear with difficulty hearing. Has had a tube placed in this ear in the past. Some coughing producing yellow mucus. Some light no drainage from the left eye at times. Had some swelling and burning the first day but this has improved. Slight itching. No visual changes. No chance of pregnancy. No relief with OTC eye drops. Facial area headache. Low-grade fever.  Objective:   BP 112/70  Temp(Src) 99 F (37.2 C) (Oral)  Ht 5\' 4"  (1.626 m)  Wt 195 lb 2 oz (88.508 kg)  BMI 33.48 kg/m2 NAD. Alert, oriented. Left TM cloudy effusion, no erythema. Right TM effusion though with moderate erythema two thirds. Pharynx injected with green PND noted. Neck supple with mild soft anterior adenopathy. Lungs clear. Heart regular rhythm. Conjunctiva mildly injected bilaterally. No edema. Positive preauricular lymph nodes noted.  Assessment: Acute nonsuppurative otitis media of right ear  Viral conjunctivitis  Acute upper respiratory infections of unspecified site  Plan:  Meds ordered this encounter  Medications  . levofloxacin (LEVAQUIN) 500 MG tablet    Sig: Take 1 tablet (500 mg total) by mouth daily.    Dispense:  10 tablet    Refill:  0    Order Specific Question:  Supervising Provider    Answer:  Merlyn AlbertLUKING, WILLIAM S [[id number]]   OTC meds as directed for congestion. Refer to ENT specialist for evaluation of her current otitis. Callback in 72 hours if no improvement symptoms, sooner if worse.

## 2014-07-24 ENCOUNTER — Ambulatory Visit (INDEPENDENT_AMBULATORY_CARE_PROVIDER_SITE_OTHER): Payer: Medicaid Other | Admitting: Otolaryngology

## 2014-07-24 DIAGNOSIS — H66019 Acute suppurative otitis media with spontaneous rupture of ear drum, unspecified ear: Secondary | ICD-10-CM

## 2014-08-07 ENCOUNTER — Ambulatory Visit (INDEPENDENT_AMBULATORY_CARE_PROVIDER_SITE_OTHER): Payer: Medicaid Other | Admitting: Otolaryngology

## 2014-09-22 ENCOUNTER — Encounter: Payer: Self-pay | Admitting: Nurse Practitioner

## 2014-09-23 ENCOUNTER — Encounter (HOSPITAL_COMMUNITY): Payer: Self-pay | Admitting: Emergency Medicine

## 2014-09-23 ENCOUNTER — Emergency Department (HOSPITAL_COMMUNITY)
Admission: EM | Admit: 2014-09-23 | Discharge: 2014-09-23 | Disposition: A | Discharge status: Home / Self Care | Payer: Medicaid Other | Attending: Emergency Medicine | Admitting: Emergency Medicine

## 2014-09-23 ENCOUNTER — Emergency Department (HOSPITAL_COMMUNITY): Payer: Medicaid Other

## 2014-09-23 DIAGNOSIS — Z8659 Personal history of other mental and behavioral disorders: Secondary | ICD-10-CM | POA: Diagnosis not present

## 2014-09-23 DIAGNOSIS — J309 Allergic rhinitis, unspecified: Secondary | ICD-10-CM | POA: Insufficient documentation

## 2014-09-23 DIAGNOSIS — Z91040 Latex allergy status: Secondary | ICD-10-CM | POA: Insufficient documentation

## 2014-09-23 DIAGNOSIS — Z880 Allergy status to penicillin: Secondary | ICD-10-CM | POA: Diagnosis not present

## 2014-09-23 DIAGNOSIS — Z79899 Other long term (current) drug therapy: Secondary | ICD-10-CM | POA: Diagnosis not present

## 2014-09-23 DIAGNOSIS — R52 Pain, unspecified: Secondary | ICD-10-CM

## 2014-09-23 DIAGNOSIS — Z792 Long term (current) use of antibiotics: Secondary | ICD-10-CM | POA: Diagnosis not present

## 2014-09-23 DIAGNOSIS — M25561 Pain in right knee: Secondary | ICD-10-CM

## 2014-09-23 DIAGNOSIS — Z8619 Personal history of other infectious and parasitic diseases: Secondary | ICD-10-CM | POA: Diagnosis not present

## 2014-09-23 NOTE — ED Provider Notes (Signed)
CSN: [id number]     Arrival date & time 09/23/14  0907 History  This chart was scribed for Donnetta HutchingBrian Nangle, MD by Annye AsaAnna Dorsett, ED Scribe. This patient was seen in room APA10/APA10 and the patient's care was started at 9:28 AM.    Chief Complaint  Patient presents with  . Leg Pain   The history is provided by the patient. No language interpreter was used.     HPI Comments: Jasmine Maynard is a 23 y.o. female who presents to the Emergency Department complaining of worsening right knee pain. Patient explains that 1 week PTA (09/16/14), she was playing with her dog in her yard and the dog ran into her knee, knocking her knee inward. Several days later (09/20/14), patient fell on asphalt in a simple mechanical fall, worsening the pain in her right knee. She denies any other significant injury or trauma. She denies any other medical concern at this time.   Past Medical History  Diagnosis Date  . DDD (degenerative disc disease)   . Depression   . Herpes   . ADHD (attention deficit hyperactivity disorder)   . Perennial allergic rhinitis    Past Surgical History  Procedure Laterality Date  . Intrauterine device insertion  2011  . Myringotomy  2011    right  . Cholecystectomy  02/13/2012    Procedure: LAPAROSCOPIC CHOLECYSTECTOMY;  Surgeon: Fabio BeringBrent C Ziegler, MD;  Location: [facility];  Service: General;  Laterality: N/A;   Family History  Problem Relation Age of Onset  . Cancer Other   . Diabetes Paternal Grandfather   . Diabetes Paternal Grandmother    History  Substance Use Topics  . Smoking status: Never Smoker   . Smokeless tobacco: Never Used  . Alcohol Use: No   OB History    Gravida Para Term Preterm AB TAB SAB Ectopic Multiple Living   1 1 1       1      Review of Systems  A complete 10 system review of systems was obtained and all systems are negative except as noted in the HPI and PMH.   Allergies  Amoxicillin; Avelox; Cefdinir; Doxycycline; and Latex  Home Medications   Prior  to Admission medications   Medication Sig Start Date End Date Taking? Authorizing Provider  cetirizine (ZYRTEC) 10 MG tablet Take 10 mg by mouth daily.   Yes Historical Provider, MD  ibuprofen (ADVIL,MOTRIN) 200 MG tablet Take 400 mg by mouth every 6 (six) hours as needed for moderate pain.   Yes Historical Provider, MD  levofloxacin (LEVAQUIN) 500 MG tablet Take 1 tablet (500 mg total) by mouth daily. Patient not taking: Reported on 09/23/2014 06/03/14   Campbell Richesarolyn C Hoskins, NP  Prenatal Multivit-Min-Fe-FA (PRENATAL VITAMINS) 0.8 MG tablet Take 1 tablet by mouth daily. Patient not taking: Reported on 09/23/2014 05/29/14   Jacklyn ShellFrances Cresenzo-Dishmon, CNM  valACYclovir (VALTREX) 500 MG tablet Take 1 tablet (500 mg total) by mouth daily. Patient not taking: Reported on 09/23/2014 05/29/14   Jacklyn ShellFrances Cresenzo-Dishmon, CNM   BP 120/72 mmHg  Pulse 75  Temp(Src) 98.7 F (37.1 C)  Resp 18  Ht 5\' 5"  (1.651 m)  Wt 195 lb (88.451 kg)  BMI 32.45 kg/m2  SpO2 98%  LMP 09/16/2014 Physical Exam  Constitutional: She is oriented to person, place, and time. She appears well-developed and well-nourished.  HENT:  Head: Normocephalic and atraumatic.  Eyes: Conjunctivae and EOM are normal. Pupils are equal, round, and reactive to light.  Neck: Normal range of motion.  Neck supple.  Cardiovascular: Normal rate, regular rhythm and normal heart sounds.   Pulmonary/Chest: Effort normal and breath sounds normal.  Abdominal: Soft. Bowel sounds are normal.  Musculoskeletal: Normal range of motion.  Initial trauma to lateral aspect of her right knee 1 week PTA (09/16/14) Larey SeatFell on interior aspect of her right knee on 09/20/14 On exam, minimally tender on lateral and interior aspect of right knee; full range of motion, no edema   Neurological: She is alert and oriented to person, place, and time.  Skin: Skin is warm and dry.  Psychiatric: She has a normal mood and affect. Her behavior is normal.  Nursing note and vitals  reviewed.   ED Course  Procedures   DIAGNOSTIC STUDIES: Oxygen Saturation is 98% on RA, normal by my interpretation.    COORDINATION OF CARE: 9:30 AM Discussed treatment plan with pt at bedside and pt agreed to plan.   Labs Review Labs Reviewed - No data to display  Imaging Review Dg Knee Complete 4 Views Right  09/23/2014   CLINICAL DATA:  Right knee pain. One week ago her dog ran anterior leg. Four days ago she fell in the road while trying to get her dog.  EXAM: RIGHT KNEE - COMPLETE 4+ VIEW  COMPARISON:  None.  FINDINGS: There is no evidence of fracture, dislocation, or joint effusion. There is no evidence of arthropathy or other focal bone abnormality. Soft tissues are unremarkable.  IMPRESSION: Negative right knee radiographs.   Electronically Signed   By: Gennette Pachris  Mattern M.D.   On: 09/23/2014 10:24     EKG Interpretation None      MDM   Final diagnoses:  Pain  Right knee pain  patient is ambulatory. Plain films of right knee are negative. Suspect medial collateral ligament strain.  Rest, ice, elevate, ibuprofen.  I personally performed the services described in this documentation, which was scribed in my presence. The recorded information has been reviewed and is accurate.      Donnetta HutchingBrian Sarinana, MD 09/23/14 1104

## 2014-09-23 NOTE — Discharge Instructions (Signed)
X-ray is normal. Ice, elevate, Ace wrap, ibuprofen or Tylenol for pain. If symptoms do not improve in 1-2 weeks, referral to orthopedic doctor. Phone number given.

## 2014-09-23 NOTE — ED Notes (Signed)
Pt c/o right knee/lower leg pain after colliding with large dog x 1.5 weeks ago, pt states she also fell on it on Saturday night. Pt ambulated to room. nad noted.

## 2014-09-23 NOTE — ED Notes (Signed)
Patient given discharge instruction, verbalized understand. Patient ambulatory out of the department.  

## 2014-11-16 ENCOUNTER — Emergency Department (HOSPITAL_COMMUNITY)
Admission: EM | Admit: 2014-11-16 | Discharge: 2014-11-16 | Disposition: A | Discharge status: Home / Self Care | Payer: Medicaid Other | Attending: Emergency Medicine | Admitting: Emergency Medicine

## 2014-11-16 ENCOUNTER — Encounter (HOSPITAL_COMMUNITY): Payer: Self-pay | Admitting: Emergency Medicine

## 2014-11-16 ENCOUNTER — Emergency Department (HOSPITAL_COMMUNITY)
Admission: EM | Admit: 2014-11-16 | Discharge: 2014-11-16 | Disposition: A | Discharge status: Home / Self Care | Payer: Medicaid Other | Source: Home / Self Care | Attending: Emergency Medicine | Admitting: Emergency Medicine

## 2014-11-16 ENCOUNTER — Encounter (HOSPITAL_COMMUNITY): Payer: Self-pay

## 2014-11-16 DIAGNOSIS — Z79899 Other long term (current) drug therapy: Secondary | ICD-10-CM

## 2014-11-16 DIAGNOSIS — Y9389 Activity, other specified: Secondary | ICD-10-CM | POA: Diagnosis not present

## 2014-11-16 DIAGNOSIS — Z91040 Latex allergy status: Secondary | ICD-10-CM

## 2014-11-16 DIAGNOSIS — Z3202 Encounter for pregnancy test, result negative: Secondary | ICD-10-CM | POA: Insufficient documentation

## 2014-11-16 DIAGNOSIS — Z8739 Personal history of other diseases of the musculoskeletal system and connective tissue: Secondary | ICD-10-CM | POA: Diagnosis not present

## 2014-11-16 DIAGNOSIS — S20462A Insect bite (nonvenomous) of left back wall of thorax, initial encounter: Secondary | ICD-10-CM | POA: Diagnosis present

## 2014-11-16 DIAGNOSIS — F329 Major depressive disorder, single episode, unspecified: Secondary | ICD-10-CM | POA: Diagnosis not present

## 2014-11-16 DIAGNOSIS — IMO0001 Reserved for inherently not codable concepts without codable children: Secondary | ICD-10-CM

## 2014-11-16 DIAGNOSIS — B349 Viral infection, unspecified: Secondary | ICD-10-CM | POA: Insufficient documentation

## 2014-11-16 DIAGNOSIS — Y929 Unspecified place or not applicable: Secondary | ICD-10-CM | POA: Diagnosis not present

## 2014-11-16 DIAGNOSIS — Z8659 Personal history of other mental and behavioral disorders: Secondary | ICD-10-CM

## 2014-11-16 DIAGNOSIS — Z880 Allergy status to penicillin: Secondary | ICD-10-CM

## 2014-11-16 DIAGNOSIS — Z8709 Personal history of other diseases of the respiratory system: Secondary | ICD-10-CM | POA: Insufficient documentation

## 2014-11-16 DIAGNOSIS — W57XXXA Bitten or stung by nonvenomous insect and other nonvenomous arthropods, initial encounter: Secondary | ICD-10-CM | POA: Insufficient documentation

## 2014-11-16 DIAGNOSIS — Z8619 Personal history of other infectious and parasitic diseases: Secondary | ICD-10-CM | POA: Insufficient documentation

## 2014-11-16 DIAGNOSIS — Y998 Other external cause status: Secondary | ICD-10-CM | POA: Diagnosis not present

## 2014-11-16 LAB — POC URINE PREG, ED: Preg Test, Ur: NEGATIVE

## 2014-11-16 LAB — URINALYSIS, ROUTINE W REFLEX MICROSCOPIC
BILIRUBIN URINE: NEGATIVE
Glucose, UA: 500 mg/dL — AB
Ketones, ur: NEGATIVE mg/dL
Leukocytes, UA: NEGATIVE
Nitrite: NEGATIVE
PH: 6.5 (ref 5.0–8.0)
Protein, ur: NEGATIVE mg/dL
Specific Gravity, Urine: 1.02 (ref 1.005–1.030)
Urobilinogen, UA: 0.2 mg/dL (ref 0.0–1.0)

## 2014-11-16 LAB — CBG MONITORING, ED: GLUCOSE-CAPILLARY: 159 mg/dL — AB (ref 70–99)

## 2014-11-16 LAB — URINE MICROSCOPIC-ADD ON

## 2014-11-16 MED ORDER — DEXAMETHASONE 10 MG/ML FOR PEDIATRIC ORAL USE
10.0000 mg | Freq: Once | INTRAMUSCULAR | Status: AC
  Administered 2014-11-16: 10 mg via ORAL
  Filled 2014-11-16: qty 1

## 2014-11-16 MED ORDER — ONDANSETRON HCL 4 MG PO TABS: 4.0000 mg | ORAL_TABLET | Freq: Four times a day (QID) | ORAL | Status: DC

## 2014-11-16 NOTE — ED Notes (Signed)
Pt alert & oriented x4, stable gait. Patient given discharge instructions, paperwork & prescription(s). Patient  instructed to stop at the registration desk to finish any additional paperwork. Patient verbalized understanding. Pt left department w/ no further questions. 

## 2014-11-16 NOTE — ED Provider Notes (Signed)
CSN: [id number]     Arrival date & time 11/16/14  0806 History  This chart was scribed for Joya Gaskinsonald W Hayzen Lorenson, MD by Ronney LionSuzanne Le, ED Scribe. This patient was seen in room APA06/APA06 and the patient's care was started at 8:44 AM.    Chief Complaint  Patient presents with  . Insect Bite    The history is provided by the patient. No language interpreter was used.   HPI Comments: Jasmine Maynard is a 23 y.o. female who presents to the Emergency Department complaining of an insect bite in her left flank that occurred about 1 hour ago; patient felt the bite and pulled "something long" out of her back. She states that her boyfriend then noted "white dots" around the bite. She complains of associated pain. Patient took one Benadryl PTA. She denies a history of DM. She also denies fever, vomiting, facial or tongue swelling, dyspnea, diarrhea, syncope, and itching.  Past Medical History  Diagnosis Date  . DDD (degenerative disc disease)   . Depression   . Herpes   . ADHD (attention deficit hyperactivity disorder)   . Perennial allergic rhinitis    Past Surgical History  Procedure Laterality Date  . Intrauterine device insertion  2011  . Myringotomy  2011    right  . Cholecystectomy  02/13/2012    Procedure: LAPAROSCOPIC CHOLECYSTECTOMY;  Surgeon: Fabio BeringBrent C Ziegler, MD;  Location: [facility];  Service: General;  Laterality: N/A;   Family History  Problem Relation Age of Onset  . Cancer Other   . Diabetes Paternal Grandfather   . Diabetes Paternal Grandmother    History  Substance Use Topics  . Smoking status: Never Smoker   . Smokeless tobacco: Never Used  . Alcohol Use: No   OB History    Gravida Para Term Preterm AB TAB SAB Ectopic Multiple Living   1 1 1       1      Review of Systems  Constitutional: Negative for fever.  Gastrointestinal: Negative for vomiting.  Skin: Positive for rash.    Allergies  Amoxicillin; Avelox; Cefdinir; Doxycycline; and Latex  Home Medications   Prior  to Admission medications   Medication Sig Start Date End Date Taking? Authorizing Provider  cetirizine (ZYRTEC) 10 MG tablet Take 10 mg by mouth daily.    Historical Provider, MD  ibuprofen (ADVIL,MOTRIN) 200 MG tablet Take 400 mg by mouth every 6 (six) hours as needed for moderate pain.    Historical Provider, MD   BP 103/67 mmHg  Pulse 78  Temp(Src) 98.2 F (36.8 C) (Oral)  Resp 18  Ht 5\' 4"  (1.626 m)  Wt 195 lb (88.451 kg)  BMI 33.46 kg/m2  SpO2 99%  LMP 11/13/2014 Physical Exam  Nursing note and vitals reviewed.    CONSTITUTIONAL: Well developed/well nourished HEAD: Normocephalic/atraumatic EYES: EOMI/PERRL ENMT: Mucous membranes moist; no angioedema NECK: supple no meningeal signs SPINE/BACK:entire spine nontender CV: S1/S2 noted, no murmurs/rubs/gallops noted LUNGS: Lungs are clear to auscultation bilaterally, no apparent distress ABDOMEN: soft, nontender, no rebound or guarding, bowel sounds noted throughout abdomen GU:no cva tenderness NEURO: Pt is awake/alert/appropriate, moves all extremitiesx4.  No facial droop.   EXTREMITIES: pulses normal/equal, full ROM SKIN: warm, color normal; scattered papules to left flank, no abscess, no cellulitis noted PSYCH: no abnormalities of mood noted, alert and oriented to situation   ED Course  Procedures   DIAGNOSTIC STUDIES: Oxygen Saturation is 99% on room air, normal by my interpretation.    COORDINATION OF CARE:  8:46 AM - Discussed treatment plan with pt at bedside which includes Benadryl every 4-6 hours as needed x 2 days, and a dosage of a steroid taken here, and pt agreed to plan.  Pt with possible mild allergic RXN.  No signs of cellulitis/abscess.  Pt is well appearing and appropriate for d/c    Medications  dexamethasone (DECADRON) 10 MG/ML injection for Pediatric ORAL use 10 mg (10 mg Oral Given 11/16/14 0904)     MDM   Final diagnoses:  Insect bite of back, left, initial encounter    I personally  performed the services described in this documentation, which was scribed in my presence. The recorded information has been reviewed and is accurate.     Joya Gaskinsonald W [place], MD 11/16/14 [phone]

## 2014-11-16 NOTE — ED Notes (Signed)
PT states she was bite by a "centipede" this morning about 20 minutes ago on her left side of her back and took 2 benadryl at home prior to arrival. Small insect bite noted to pt's back.

## 2014-11-16 NOTE — Discharge Instructions (Signed)
Your examination is consistent with a viral illness. Please increase fluids. Please use Tylenol every 4 hours, or ibuprofen every 6 hours for fever. May use Zofran every 6 hours for nausea if needed. Please see your primary physician, or return to the emergency department if not improving. Viral Infections A virus is a type of germ. Viruses can cause:  Minor sore throats.  Aches and pains.  Headaches.  Runny nose.  Rashes.  Watery eyes.  Tiredness.  Coughs.  Loss of appetite.  Feeling sick to your stomach (nausea).  Throwing up (vomiting).  Watery poop (diarrhea). HOME CARE   Only take medicines as told by your doctor.  Drink enough water and fluids to keep your pee (urine) clear or pale yellow. Sports drinks are a good choice.  Get plenty of rest and eat healthy. Soups and broths with crackers or rice are fine. GET HELP RIGHT AWAY IF:   You have a very bad headache.  You have shortness of breath.  You have chest pain or neck pain.  You have an unusual rash.  You cannot stop throwing up.  You have watery poop that does not stop.  You cannot keep fluids down.  You or your child has a temperature by mouth above 102 F (38.9 C), not controlled by medicine.  Your baby is older than 3 months with a rectal temperature of 102 F (38.9 C) or higher.  Your baby is 843 months old or younger with a rectal temperature of 100.4 F (38 C) or higher. MAKE SURE YOU:   Understand these instructions.  Will watch this condition.  Will get help right away if you are not doing well or get worse. Document Released: 10/20/2008 Document Revised: 01/30/2012 Document Reviewed: 03/15/2011 [facility] Patient Information 2015 [place] [place]. This information is not intended to replace advice given to you by your health care provider. Make sure you discuss any questions you have with your health care provider.

## 2014-11-16 NOTE — ED Notes (Signed)
Pt was seen here earlier today for "bug bite" and per patient was sent home with instructions "to come back if I had a fever", pt states she had temp of 100.3 which she took 400mg  Motrin at home, here to be seen for "fever". Triage temp 99.3.

## 2014-11-21 NOTE — L&D Delivery Note (Cosign Needed)
Delivery Note After a 10 minute 2nd stage, At 5:37 AM a viable female was delivered via  (Presentation: LOA;  ).  APGAR:8/9, ; weight pending. After 3 minutes, the cord was clamped and cut. 40 units of pitocin diluted in 1000cc LR was infused rapidly IV.  The placenta separated spontaneously and delivered via CCT and maternal pushing effort.  It was inspected and appears to be intact with a 3 VC.   Anesthesia: epidural  Episiotomy:  none Lacerations:  none Suture Repair: n/a Est. Blood Loss (mL):  200  Mom to postpartum.  Baby to Couplet care / Skin to Skin.  CRESENZO-DISHMAN,Gizell Danser 09/25/2015, 5:49 AM

## 2014-11-21 NOTE — ED Provider Notes (Signed)
CSN: [id number]     Arrival date & time 11/16/14  1950 History   First MD Initiated Contact with Patient 11/16/14 2204     Chief Complaint  Patient presents with  . Fever     (Consider location/radiation/quality/duration/timing/severity/associated sxs/prior Treatment) Patient is a 24 y.o. female presenting with fever. The history is provided by the patient.  Fever Temp source:  Subjective Severity:  Moderate Onset quality:  Sudden Duration:  1 day Timing:  Intermittent Progression:  Improving Chronicity:  New Relieved by:  Ibuprofen Worsened by:  Nothing tried Ineffective treatments:  None tried Associated symptoms: congestion and rhinorrhea   Associated symptoms: no chest pain, no confusion, no cough, no diarrhea, no dysuria, no nausea, no sore throat and no vomiting   Associated symptoms comment:  Pt sustained a "bug or insect bite" earlier today Risk factors: sick contacts   Risk factors: no immunosuppression     Past Medical History  Diagnosis Date  . DDD (degenerative disc disease)   . Depression   . Herpes   . ADHD (attention deficit hyperactivity disorder)   . Perennial allergic rhinitis    Past Surgical History  Procedure Laterality Date  . Intrauterine device insertion  2011  . Myringotomy  2011    right  . Cholecystectomy  02/13/2012    Procedure: LAPAROSCOPIC CHOLECYSTECTOMY;  Surgeon: Fabio Bering, MD;  Location: [facility];  Service: General;  Laterality: N/A;   Family History  Problem Relation Age of Onset  . Cancer Other   . Diabetes Paternal Grandfather   . Diabetes Paternal Grandmother    History  Substance Use Topics  . Smoking status: Never Smoker   . Smokeless tobacco: Never Used  . Alcohol Use: No   OB History    Gravida Para Term Preterm AB TAB SAB Ectopic Multiple Living   Review of Systems  Constitutional: Positive for fever. Negative for activity change.       All ROS Neg except as noted in HPI  HENT: Positive  for congestion and rhinorrhea. Negative for nosebleeds and sore throat.   Eyes: Negative for photophobia and discharge.  Respiratory: Negative for cough, shortness of breath and wheezing.   Cardiovascular: Negative for chest pain and palpitations.  Gastrointestinal: Negative for nausea, vomiting, abdominal pain, diarrhea and blood in stool.  Genitourinary: Negative for dysuria, frequency and hematuria.  Musculoskeletal: Negative for back pain, arthralgias and neck pain.  Skin: Negative.   Neurological: Negative for dizziness, seizures and speech difficulty.  Psychiatric/Behavioral: Negative for hallucinations and confusion.      Allergies  Amoxicillin; Avelox; Cefdinir; Doxycycline; and Latex  Home Medications   Prior to Admission medications   Medication Sig Start Date End Date Taking? Authorizing Provider  cetirizine (ZYRTEC) 10 MG tablet Take 10 mg by mouth daily.    Historical Provider, MD  ibuprofen (ADVIL,MOTRIN) 200 MG tablet Take 400 mg by mouth every 6 (six) hours as needed for moderate pain.    Historical Provider, MD  ondansetron (ZOFRAN) 4 MG tablet Take 1 tablet (4 mg total) by mouth every 6 (six) hours. 11/16/14   Kathie Dike, PA-C   LMP 11/13/2014 Physical Exam  Constitutional: She is oriented to person, place, and time. She appears well-developed and well-nourished.  Non-toxic appearance.  HENT:  Head: Normocephalic.  Right Ear: Tympanic membrane and external ear normal.  Left Ear: Tympanic membrane and external ear normal.  Nasal congestion  Eyes: EOM and lids are normal. Pupils are equal, round, and reactive to light.  Neck: Normal range of motion. Neck supple. Carotid bruit is not present.  Cardiovascular: Normal rate, regular rhythm, normal heart sounds, intact distal pulses and normal pulses.   Pulmonary/Chest: Breath sounds normal. No respiratory distress.  Abdominal: Soft. Bowel sounds are normal. There is no tenderness. There is no guarding.   Musculoskeletal: Normal range of motion.  Lymphadenopathy:       Head (right side): No submandibular adenopathy present.       Head (left side): No submandibular adenopathy present.    She has no cervical adenopathy.  Neurological: She is alert and oriented to person, place, and time. She has normal strength. No cranial nerve deficit or sensory deficit.  Skin: Skin is warm and dry. No rash noted.  Psychiatric: She has a normal mood and affect. Her speech is normal.  Nursing note and vitals reviewed.   ED Course  Procedures (including critical care time) Labs Review Labs Reviewed  URINALYSIS, ROUTINE W REFLEX MICROSCOPIC - Abnormal; Notable for the following:    Glucose, UA 500 (*)    Hgb urine dipstick LARGE (*)    All other components within normal limits  CBG MONITORING, ED - Abnormal; Notable for the following:    Glucose-Capillary 159 (*)    All other components within normal limits  URINE MICROSCOPIC-ADD ON  POC URINE PREG, ED    Imaging Review No results found.   EKG Interpretation None      MDM No high fever. No hemoptysis. No unusual rash. UA negative. Suspect viral illness. Rx for zofran      Final diagnoses:  Viral illness    *I have reviewed nursing notes, vital signs, and all appropriate lab and imaging results for this patient.**    Kathie Dike, PA-C 11/21/14 1530  Kathie Dike, PA-C 11/21/14 1546  Geoffery Lyons, MD 11/23/14 [phone]

## 2014-12-07 ENCOUNTER — Encounter (HOSPITAL_COMMUNITY): Payer: Self-pay | Admitting: *Deleted

## 2014-12-07 ENCOUNTER — Emergency Department (HOSPITAL_COMMUNITY): Payer: Medicaid Other

## 2014-12-07 ENCOUNTER — Emergency Department (HOSPITAL_COMMUNITY)
Admission: EM | Admit: 2014-12-07 | Discharge: 2014-12-07 | Disposition: A | Discharge status: Home / Self Care | Payer: Medicaid Other | Attending: Emergency Medicine | Admitting: Emergency Medicine

## 2014-12-07 DIAGNOSIS — R630 Anorexia: Secondary | ICD-10-CM | POA: Diagnosis not present

## 2014-12-07 DIAGNOSIS — K6289 Other specified diseases of anus and rectum: Secondary | ICD-10-CM | POA: Insufficient documentation

## 2014-12-07 DIAGNOSIS — Z880 Allergy status to penicillin: Secondary | ICD-10-CM | POA: Diagnosis not present

## 2014-12-07 DIAGNOSIS — R195 Other fecal abnormalities: Secondary | ICD-10-CM | POA: Diagnosis not present

## 2014-12-07 DIAGNOSIS — Z3202 Encounter for pregnancy test, result negative: Secondary | ICD-10-CM | POA: Diagnosis not present

## 2014-12-07 DIAGNOSIS — Z9049 Acquired absence of other specified parts of digestive tract: Secondary | ICD-10-CM | POA: Diagnosis not present

## 2014-12-07 DIAGNOSIS — Z79899 Other long term (current) drug therapy: Secondary | ICD-10-CM | POA: Diagnosis not present

## 2014-12-07 DIAGNOSIS — Z91040 Latex allergy status: Secondary | ICD-10-CM | POA: Insufficient documentation

## 2014-12-07 DIAGNOSIS — R1030 Lower abdominal pain, unspecified: Secondary | ICD-10-CM | POA: Diagnosis present

## 2014-12-07 DIAGNOSIS — M545 Low back pain: Secondary | ICD-10-CM | POA: Insufficient documentation

## 2014-12-07 DIAGNOSIS — Z8659 Personal history of other mental and behavioral disorders: Secondary | ICD-10-CM | POA: Insufficient documentation

## 2014-12-07 DIAGNOSIS — Z8619 Personal history of other infectious and parasitic diseases: Secondary | ICD-10-CM | POA: Diagnosis not present

## 2014-12-07 DIAGNOSIS — K5900 Constipation, unspecified: Secondary | ICD-10-CM | POA: Diagnosis not present

## 2014-12-07 DIAGNOSIS — G8929 Other chronic pain: Secondary | ICD-10-CM | POA: Diagnosis not present

## 2014-12-07 LAB — COMPREHENSIVE METABOLIC PANEL
ALT: 21 U/L (ref 0–35)
AST: 16 U/L (ref 0–37)
Albumin: 4.3 g/dL (ref 3.5–5.2)
Alkaline Phosphatase: 47 U/L (ref 39–117)
Anion gap: 6 (ref 5–15)
BUN: 9 mg/dL (ref 6–23)
CO2: 25 mmol/L (ref 19–32)
Calcium: 9.2 mg/dL (ref 8.4–10.5)
Chloride: 105 mEq/L (ref 96–112)
Creatinine, Ser: 0.69 mg/dL (ref 0.50–1.10)
GFR calc Af Amer: >90 mL/min (ref >90)
GFR calc non Af Amer: >90 mL/min (ref >90)
Glucose, Bld: 81 mg/dL (ref 70–99)
Potassium: 3.9 mmol/L (ref 3.5–5.1)
Sodium: 136 mmol/L (ref 135–145)
Total Bilirubin: 0.6 mg/dL (ref 0.3–1.2)
Total Protein: 7.4 g/dL (ref 6.0–8.3)

## 2014-12-07 LAB — PREGNANCY, URINE: Preg Test, Ur: NEGATIVE

## 2014-12-07 LAB — WET PREP, GENITAL
Clue Cells Wet Prep HPF POC: NONE SEEN
Trich, Wet Prep: NONE SEEN
Yeast Wet Prep HPF POC: NONE SEEN

## 2014-12-07 LAB — CBC WITH DIFFERENTIAL/PLATELET
Basophils Absolute: 0 10*3/uL (ref 0.0–0.1)
Basophils Relative: 0 % (ref 0–1)
Eosinophils Absolute: 0.2 10*3/uL (ref 0.0–0.7)
Eosinophils Relative: 4 % (ref 0–5)
HCT: 40.2 % (ref 36.0–46.0)
Hemoglobin: 13.8 g/dL (ref 12.0–15.0)
Lymphocytes Relative: 28 % (ref 12–46)
Lymphs Abs: 1.7 10*3/uL (ref 0.7–4.0)
MCH: 29.7 pg (ref 26.0–34.0)
MCHC: 34.3 g/dL (ref 30.0–36.0)
MCV: 86.5 fL (ref 78.0–100.0)
Monocytes Absolute: 0.5 10*3/uL (ref 0.1–1.0)
Monocytes Relative: 9 % (ref 3–12)
Neutro Abs: 3.5 10*3/uL (ref 1.7–7.7)
Neutrophils Relative %: 59 % (ref 43–77)
Platelets: 235 10*3/uL (ref 150–400)
RBC: 4.65 MIL/uL (ref 3.87–5.11)
RDW: 12.2 % (ref 11.5–15.5)
WBC: 6 10*3/uL (ref 4.0–10.5)

## 2014-12-07 LAB — URINE MICROSCOPIC-ADD ON

## 2014-12-07 LAB — URINALYSIS, ROUTINE W REFLEX MICROSCOPIC
Bilirubin Urine: NEGATIVE
Glucose, UA: NEGATIVE mg/dL
Ketones, ur: NEGATIVE mg/dL
Nitrite: NEGATIVE
Protein, ur: NEGATIVE mg/dL
Specific Gravity, Urine: 1.025 (ref 1.005–1.030)
Urobilinogen, UA: 0.2 mg/dL (ref 0.0–1.0)
pH: 6 (ref 5.0–8.0)

## 2014-12-07 MED ORDER — MORPHINE SULFATE 4 MG/ML IJ SOLN
4.0000 mg | Freq: Once | INTRAMUSCULAR | Status: AC
  Administered 2014-12-07: 4 mg via INTRAVENOUS
  Filled 2014-12-07: qty 1

## 2014-12-07 MED ORDER — POLYETHYLENE GLYCOL 3350 17 G PO PACK: 17.0000 g | PACK | Freq: Every day | ORAL | Status: DC

## 2014-12-07 NOTE — ED Provider Notes (Signed)
CSN: [id number]     Arrival date & time 12/07/14  1048 History   First MD Initiated Contact with Patient 12/07/14 1056     Chief Complaint  Patient presents with  . Abdominal Pain     (Consider location/radiation/quality/duration/timing/severity/associated sxs/prior Treatment) HPI Pt is a 24yo female presenting to ED with c/o lower abdominal pain, and lower back pain radiating into her rectum.  Pt also states lower back pain and rectal pain worse with BM and urinating.  Pain is waxing and waning, started this morning, 8/10 at worst, associated with nausea. Pt reports having her gallbladder removed in 2013, since then, she has had either diarrhea or constipation. Reports being constipated the last few days.  She has not tried any pain medication or stool softeners at home PTA. Denies vaginal symptoms.   Past Medical History  Diagnosis Date  . DDD (degenerative disc disease)   . Depression   . Herpes   . ADHD (attention deficit hyperactivity disorder)   . Perennial allergic rhinitis    Past Surgical History  Procedure Laterality Date  . Intrauterine device insertion  2011  . Myringotomy  2011    right  . Cholecystectomy  02/13/2012    Procedure: LAPAROSCOPIC CHOLECYSTECTOMY;  Surgeon: Fabio BeringBrent C Ziegler, MD;  Location: [facility];  Service: General;  Laterality: N/A;   Family History  Problem Relation Age of Onset  . Cancer Other   . Diabetes Paternal Grandfather   . Diabetes Paternal Grandmother    History  Substance Use Topics  . Smoking status: Never Smoker   . Smokeless tobacco: Never Used  . Alcohol Use: No   OB History    Gravida Para Term Preterm AB TAB SAB Ectopic Multiple Living   1 1 1       1      Review of Systems  Constitutional: Positive for appetite change. Negative for fever and chills.  Respiratory: Negative for cough and shortness of breath.   Cardiovascular: Negative for chest pain and palpitations.  Gastrointestinal: Positive for nausea, abdominal pain,  constipation, blood in stool and rectal pain. Negative for vomiting, diarrhea and anal bleeding.  Genitourinary: Positive for pelvic pain. Negative for dysuria, urgency, hematuria, flank pain, decreased urine volume, vaginal bleeding, vaginal discharge and vaginal pain.  Musculoskeletal: Positive for back pain (left lower).  All other systems reviewed and are negative.     Allergies  Amoxicillin; Avelox; Cefdinir; Doxycycline; and Latex  Home Medications   Prior to Admission medications   Medication Sig Start Date End Date Taking? Authorizing Provider  cetirizine (ZYRTEC) 10 MG tablet Take 10 mg by mouth daily.   Yes Historical Provider, MD  ibuprofen (ADVIL,MOTRIN) 200 MG tablet Take 400 mg by mouth every 6 (six) hours as needed for moderate pain.   Yes Historical Provider, MD  ondansetron (ZOFRAN) 4 MG tablet Take 1 tablet (4 mg total) by mouth every 6 (six) hours. Patient not taking: Reported on 12/07/2014 11/16/14   Kathie DikeHobson M Bryant, PA-C  polyethylene glycol [facility](MIRALAX / Ethelene HalGLYCOLAX) packet Take 17 g by mouth daily. 12/07/14   Junius FinnerErin O'Malley, PA-C   BP 122/82 mmHg  Pulse 66  Temp(Src) 98.9 F (37.2 C) (Oral)  Resp 11  Ht 5\' 5"  (1.651 m)  Wt 195 lb (88.451 kg)  BMI 32.45 kg/m2  SpO2 100%  LMP 11/24/2014 Physical Exam  Constitutional: She appears well-developed and well-nourished. No distress.  HENT:  Head: Normocephalic and atraumatic.  Eyes: Conjunctivae are normal. No scleral icterus.  Neck:  Normal range of motion.  Cardiovascular: Normal rate, regular rhythm and normal heart sounds.   Pulmonary/Chest: Effort normal and breath sounds normal. No respiratory distress. She has no wheezes. She has no rales. She exhibits no tenderness.  Abdominal: Soft. Bowel sounds are normal. She exhibits no distension and no mass. There is tenderness (RLQ and suprapubic region). There is no rebound and no guarding.  No CVAT  Genitourinary:  Chaperoned exam. Normal external genitalia. Vaginal  canal: small amount of thick white-clear discharge. No vaginal bleeding. No CMT, adnexal tenderness or masses.  Rectal exam: no fecal impaction. Scant brown stool on glove. No rectal bleeding.  Musculoskeletal: Normal range of motion.  Neurological: She is alert.  Skin: Skin is warm and dry. She is not diaphoretic.  Nursing note and vitals reviewed.   ED Course  Procedures (including critical care time) Labs Review Labs Reviewed  WET PREP, GENITAL - Abnormal; Notable for the following:    WBC, Wet Prep HPF POC FEW (*)    All other components within normal limits  URINALYSIS, ROUTINE W REFLEX MICROSCOPIC - Abnormal; Notable for the following:    Hgb urine dipstick TRACE (*)    Leukocytes, UA TRACE (*)    All other components within normal limits  URINE MICROSCOPIC-ADD ON - Abnormal; Notable for the following:    Bacteria, UA FEW (*)    All other components within normal limits  PREGNANCY, URINE  CBC WITH DIFFERENTIAL  COMPREHENSIVE METABOLIC PANEL  GC/CHLAMYDIA PROBE AMP ([place])    Imaging Review Dg Abd Acute W/chest  12/07/2014   CLINICAL DATA:  Initial encounter for lower abdominal and back pain. Constipation. Rectal pain.  EXAM: ACUTE ABDOMEN SERIES (ABDOMEN 2 VIEW & CHEST 1 VIEW)  COMPARISON:  Ultrasound 01/23/2012.  FINDINGS: Frontal view of the chest demonstrates midline trachea. Normal heart size and mediastinal contours. No pleural effusion or pneumothorax. Clear lungs.  Abdominal films demonstrate no free intraperitoneal air or significant air-fluid levels on upright positioning. Probable cholecystectomy. Large amount of colonic stool on supine positioning. No abnormal abdominal calcifications. No appendicolith.  IMPRESSION: No acute findings.  Possible constipation.   Electronically Signed   By: Jeronimo Greaves M.D.   On: 12/07/2014 12:31     EKG Interpretation None      MDM   Final diagnoses:  Constipation  Rectal pain  Lower abdominal pain  Constipation,  unspecified constipation type    Pt with hx of cholecystectomy with resulting intermittent diarrhea and constipation, c/o lower abdominal, back and rectal pain. On exam, Abdomen is soft with lower abdominal tenderness. No fecal impaction. Normal pelvic exam. Labs: unremarakble. Acute abd w/ chest: significant for constipation with large amount of colonic stool w/o acute findings.  Doubt SBO or perforation. Pt hemodynamically stable for discharge home. Agrees with plan for stool softening with miralax. Home care instructions provided. Return precautions provided. Pt verbalized understanding and agreement with tx plan.  Discussed pt with Dr. Adriana Simas who agrees with assessment and plan.   Junius Finner, PA-C 12/07/14 1305  Donnetta Hutching, MD 12/09/14 [phone]

## 2014-12-07 NOTE — ED Notes (Signed)
Pt states lower abdominal pain, lower back pain (hx of chronic lower back pain), pain to rectal area, and pain to lower back with urinating.

## 2014-12-08 LAB — GC/CHLAMYDIA PROBE AMP (~~LOC~~) NOT AT ARMC
Chlamydia: NEGATIVE
Neisseria Gonorrhea: NEGATIVE

## 2015-01-21 ENCOUNTER — Ambulatory Visit (INDEPENDENT_AMBULATORY_CARE_PROVIDER_SITE_OTHER): Payer: Medicaid Other | Admitting: Adult Health

## 2015-01-21 ENCOUNTER — Encounter: Payer: Self-pay | Admitting: Adult Health

## 2015-01-21 VITALS — BP 120/64 | HR 87 | Ht 64.0 in | Wt 203.0 lb

## 2015-01-21 DIAGNOSIS — R12 Heartburn: Secondary | ICD-10-CM

## 2015-01-21 DIAGNOSIS — R42 Dizziness and giddiness: Secondary | ICD-10-CM | POA: Diagnosis not present

## 2015-01-21 DIAGNOSIS — Z3490 Encounter for supervision of normal pregnancy, unspecified, unspecified trimester: Secondary | ICD-10-CM

## 2015-01-21 DIAGNOSIS — Z3201 Encounter for pregnancy test, result positive: Secondary | ICD-10-CM

## 2015-01-21 HISTORY — DX: Encounter for supervision of normal pregnancy, unspecified, unspecified trimester: Z34.90

## 2015-01-21 LAB — POCT URINE PREGNANCY: PREG TEST UR: POSITIVE

## 2015-01-21 MED ORDER — PRENATAL PLUS 27-1 MG PO TABS
1.0000 | ORAL_TABLET | Freq: Every day | ORAL | Status: DC
Start: 2015-01-21 — End: 2015-10-19

## 2015-01-21 NOTE — Progress Notes (Signed)
Subjective:     Patient ID: Jasmine Maynard, female   DOB: 12/19/1990, 24 y.o.   MRN: [id number]  HPI Jasmine Maynard is a 24 year old white female in for missed period and 5 HPT, has heartburn and has history of vertigo and uses antivert.Some nausea with vertigo and spotted x 1 none now.   Review of Systems +missed period + heartburn and +motion sickness, all other systems negative Reviewed past medical,surgical, social and family history. Reviewed medications and allergies.      Objective:   Physical Exam BP 120/64 mmHg  Pulse 87  Ht 5\' 4"  (1.626 m)  Wt 203 lb (92.08 kg)  BMI 34.83 kg/m2  LMP 12/15/2014 UPT+, about 5+1 week with EDD 09/23/15 by LMP,Skin warm and dry, abdomen soft mildly tender in epi gastric area, no masses     Assessment:     Pregnant +UPT Heartburn  Vertigo    Plan:     Rx prenatal plus #30 1 daily with 11 refills Eat 6 small meals OK to take TUMs and antivert if needed Return in 2 weeks for dating US  Review handout on first trimester and new OB packet given

## 2015-01-21 NOTE — Patient Instructions (Signed)
First Trimester of Pregnancy The first trimester of pregnancy is from week 1 until the end of week 12 (months 1 through 3). A week after a sperm fertilizes an egg, the egg will implant on the wall of the uterus. This embryo will begin to develop into a baby. Genes from you and your partner are forming the baby. The female genes determine whether the baby is a boy or a girl. At 6-8 weeks, the eyes and face are formed, and the heartbeat can be seen on ultrasound. At the end of 12 weeks, all the baby's organs are formed.  Now that you are pregnant, you will want to do everything you can to have a healthy baby. Two of the most important things are to get good prenatal care and to follow your health care provider's instructions. Prenatal care is all the medical care you receive before the baby's birth. This care will help prevent, find, and treat any problems during the pregnancy and childbirth. BODY CHANGES Your body goes through many changes during pregnancy. The changes vary from woman to woman.   You may gain or lose a couple of pounds at first.  You may feel sick to your stomach (nauseous) and throw up (vomit). If the vomiting is uncontrollable, call your health care provider.  You may tire easily.  You may develop headaches that can be relieved by medicines approved by your health care provider.  You may urinate more often. Painful urination may mean you have a bladder infection.  You may develop heartburn as a result of your pregnancy.  You may develop constipation because certain hormones are causing the muscles that push waste through your intestines to slow down.  You may develop hemorrhoids or swollen, bulging veins (varicose veins).  Your breasts may begin to grow larger and become tender. Your nipples may stick out more, and the tissue that surrounds them (areola) may become darker.  Your gums may bleed and may be sensitive to brushing and flossing.  Dark spots or blotches (chloasma,  mask of pregnancy) may develop on your face. This will likely fade after the baby is born.  Your menstrual periods will stop.  You may have a loss of appetite.  You may develop cravings for certain kinds of food.  You may have changes in your emotions from day to day, such as being excited to be pregnant or being concerned that something may go wrong with the pregnancy and baby.  You may have more vivid and strange dreams.  You may have changes in your hair. These can include thickening of your hair, rapid growth, and changes in texture. Some women also have hair loss during or after pregnancy, or hair that feels dry or thin. Your hair will most likely return to normal after your baby is born. WHAT TO EXPECT AT YOUR PRENATAL VISITS During a routine prenatal visit:  You will be weighed to make sure you and the baby are growing normally.  Your blood pressure will be taken.  Your abdomen will be measured to track your baby's growth.  The fetal heartbeat will be listened to starting around week 10 or 12 of your pregnancy.  Test results from any previous visits will be discussed. Your health care provider may ask you:  How you are feeling.  If you are feeling the baby move.  If you have had any abnormal symptoms, such as leaking fluid, bleeding, severe headaches, or abdominal cramping.  If you have any questions. Other tests   that may be performed during your first trimester include:  Blood tests to find your blood type and to check for the presence of any previous infections. They will also be used to check for low iron levels (anemia) and Rh antibodies. Later in the pregnancy, blood tests for diabetes will be done along with other tests if problems develop.  Urine tests to check for infections, diabetes, or protein in the urine.  An ultrasound to confirm the proper growth and development of the baby.  An amniocentesis to check for possible genetic problems.  Fetal screens for  spina bifida and Down syndrome.  You may need other tests to make sure you and the baby are doing well. HOME CARE INSTRUCTIONS  Medicines  Follow your health care provider's instructions regarding medicine use. Specific medicines may be either safe or unsafe to take during pregnancy.  Take your prenatal vitamins as directed.  If you develop constipation, try taking a stool softener if your health care provider approves. Diet  Eat regular, well-balanced meals. Choose a variety of foods, such as meat or vegetable-based protein, fish, milk and low-fat dairy products, vegetables, fruits, and whole grain breads and cereals. Your health care provider will help you determine the amount of weight gain that is right for you.  Avoid raw meat and uncooked cheese. These carry germs that can cause birth defects in the baby.  Eating four or five small meals rather than three large meals a day may help relieve nausea and vomiting. If you start to feel nauseous, eating a few soda crackers can be helpful. Drinking liquids between meals instead of during meals also seems to help nausea and vomiting.  If you develop constipation, eat more high-fiber foods, such as fresh vegetables or fruit and whole grains. Drink enough fluids to keep your urine clear or pale yellow. Activity and Exercise  Exercise only as directed by your health care provider. Exercising will help you:  Control your weight.  Stay in shape.  Be prepared for labor and delivery.  Experiencing pain or cramping in the lower abdomen or low back is a good sign that you should stop exercising. Check with your health care provider before continuing normal exercises.  Try to avoid standing for long periods of time. Move your legs often if you must stand in one place for a long time.  Avoid heavy lifting.  Wear low-heeled shoes, and practice good posture.  You may continue to have sex unless your health care provider directs you  otherwise. Relief of Pain or Discomfort  Wear a good support bra for breast tenderness.   Take warm sitz baths to soothe any pain or discomfort caused by hemorrhoids. Use hemorrhoid cream if your health care provider approves.   Rest with your legs elevated if you have leg cramps or low back pain.  If you develop varicose veins in your legs, wear support hose. Elevate your feet for 15 minutes, 3-4 times a day. Limit salt in your diet. Prenatal Care  Schedule your prenatal visits by the twelfth week of pregnancy. They are usually scheduled monthly at first, then more often in the last 2 months before delivery.  Write down your questions. Take them to your prenatal visits.  Keep all your prenatal visits as directed by your health care provider. Safety  Wear your seat belt at all times when driving.  Make a list of emergency phone numbers, including numbers for family, friends, the hospital, and police and fire departments. General Tips    Ask your health care provider for a referral to a local prenatal education class. Begin classes no later than at the beginning of month 6 of your pregnancy.  Ask for help if you have counseling or nutritional needs during pregnancy. Your health care provider can offer advice or refer you to specialists for help with various needs.  Do not use hot tubs, steam rooms, or saunas.  Do not douche or use tampons or scented sanitary pads.  Do not cross your legs for long periods of time.  Avoid cat litter boxes and soil used by cats. These carry germs that can cause birth defects in the baby and possibly loss of the fetus by miscarriage or stillbirth.  Avoid all smoking, herbs, alcohol, and medicines not prescribed by your health care provider. Chemicals in these affect the formation and growth of the baby.  Schedule a dentist appointment. At home, brush your teeth with a soft toothbrush and be gentle when you floss. SEEK MEDICAL CARE IF:   You have  dizziness.  You have mild pelvic cramps, pelvic pressure, or nagging pain in the abdominal area.  You have persistent nausea, vomiting, or diarrhea.  You have a bad smelling vaginal discharge.  You have pain with urination.  You notice increased swelling in your face, hands, legs, or ankles. SEEK IMMEDIATE MEDICAL CARE IF:   You have a fever.  You are leaking fluid from your vagina.  You have spotting or bleeding from your vagina.  You have severe abdominal cramping or pain.  You have rapid weight gain or loss.  You vomit blood or material that looks like coffee grounds.  You are exposed to [place] and have never had them.  You are exposed to fifth disease or chickenpox.  You develop a severe headache.  You have shortness of breath.  You have any kind of trauma, such as from a fall or a car accident. Document Released: 11/01/2001 Document Revised: 03/24/2014 Document Reviewed: 09/17/2013 [facility] Patient Information 2015 [place], [place]. This information is not intended to replace advice given to you by your health care provider. Make sure you discuss any questions you have with your health care provider. Return in 2 weeks for dating US OK to use TUMS OK to take antivert

## 2015-01-29 ENCOUNTER — Telehealth: Payer: Self-pay | Admitting: Advanced Practice Midwife

## 2015-01-29 NOTE — Telephone Encounter (Signed)
Pt states she has had some cramping over past couple of days that feel like menstrual cramps, no heavy bleeding just some spotting occasionally.  She just had a positive pregnancy test on 03/02, has not had dating US or new OB yet.  Advised pt to take tylenol and push fluids and if develops heavy bleeding to notify us.  Pt verbalized understanding.

## 2015-02-04 ENCOUNTER — Ambulatory Visit (INDEPENDENT_AMBULATORY_CARE_PROVIDER_SITE_OTHER): Payer: Medicaid Other

## 2015-02-04 ENCOUNTER — Encounter: Payer: Self-pay | Admitting: *Deleted

## 2015-02-04 ENCOUNTER — Ambulatory Visit (INDEPENDENT_AMBULATORY_CARE_PROVIDER_SITE_OTHER): Payer: Medicaid Other | Admitting: *Deleted

## 2015-02-04 VITALS — BP 120/76 | Ht 64.0 in | Wt 199.0 lb

## 2015-02-04 DIAGNOSIS — Z3490 Encounter for supervision of normal pregnancy, unspecified, unspecified trimester: Secondary | ICD-10-CM

## 2015-02-04 DIAGNOSIS — O360931 Maternal care for other rhesus isoimmunization, third trimester, fetus 1: Secondary | ICD-10-CM | POA: Diagnosis not present

## 2015-02-04 DIAGNOSIS — O3680X0 Pregnancy with inconclusive fetal viability, not applicable or unspecified: Secondary | ICD-10-CM | POA: Diagnosis not present

## 2015-02-04 DIAGNOSIS — Z2913 Encounter for prophylactic Rho(D) immune globulin: Secondary | ICD-10-CM

## 2015-02-04 MED ORDER — RHO D IMMUNE GLOBULIN 1500 UNIT/2ML IJ SOSY
300.0000 ug | PREFILLED_SYRINGE | Freq: Once | INTRAMUSCULAR | Status: AC
  Administered 2015-02-04: 300 ug via INTRAMUSCULAR

## 2015-02-04 NOTE — Progress Notes (Signed)
Patient ID: Jasmine PhenixRhyanna Maynard, female   DOB: 06/24/1991, 24 y.o.   MRN: [id number] Pt here today for US but has had some spotting. Pt denies any bleeding at this time. Was advised by Dr. Emelda FearFerguson to give pt Rhogam injection.Pt was also advised to push fluids and increase activity gradually.   Rhogam was given and tolerated well.

## 2015-02-04 NOTE — Progress Notes (Signed)
Dating US today.  CRL measures 10.601mm which correlates to 6+[redacted] weeks GA with an EDC of 09-24-15.  FHR 133 bpm.  Right ovary appears normal.  Left ovary not well visualized.

## 2015-02-09 ENCOUNTER — Inpatient Hospital Stay (HOSPITAL_COMMUNITY)
Admission: AD | Admit: 2015-02-09 | Discharge: 2015-02-09 | Disposition: A | Discharge status: Home / Self Care | Payer: Medicaid Other | Source: Ambulatory Visit | Attending: Obstetrics & Gynecology | Admitting: Obstetrics & Gynecology

## 2015-02-09 ENCOUNTER — Encounter (HOSPITAL_COMMUNITY): Payer: Self-pay | Admitting: *Deleted

## 2015-02-09 DIAGNOSIS — K219 Gastro-esophageal reflux disease without esophagitis: Secondary | ICD-10-CM

## 2015-02-09 DIAGNOSIS — K5900 Constipation, unspecified: Secondary | ICD-10-CM | POA: Diagnosis not present

## 2015-02-09 DIAGNOSIS — R1010 Upper abdominal pain, unspecified: Secondary | ICD-10-CM | POA: Diagnosis not present

## 2015-02-09 DIAGNOSIS — O99611 Diseases of the digestive system complicating pregnancy, first trimester: Secondary | ICD-10-CM | POA: Insufficient documentation

## 2015-02-09 DIAGNOSIS — Z3A01 Less than 8 weeks gestation of pregnancy: Secondary | ICD-10-CM | POA: Diagnosis not present

## 2015-02-09 MED ORDER — OMEPRAZOLE 20 MG PO CPDR: 20.0000 mg | DELAYED_RELEASE_CAPSULE | Freq: Two times a day (BID) | ORAL | Status: DC

## 2015-02-09 MED ORDER — GI COCKTAIL ~~LOC~~
30.0000 mL | Freq: Once | ORAL | Status: AC
  Administered 2015-02-09: 30 mL via ORAL
  Filled 2015-02-09: qty 30

## 2015-02-09 MED ORDER — DOCUSATE SODIUM 100 MG PO CAPS: 100.0000 mg | ORAL_CAPSULE | Freq: Two times a day (BID) | ORAL | Status: DC

## 2015-02-09 NOTE — MAU Provider Note (Signed)
S;Feels much better. reaqdy for d/c home O: VSS. Pain gone A: GERD and Constipation P: D/C home rx for prilosec and colace

## 2015-02-09 NOTE — MAU Provider Note (Signed)
History   G2P1 at [redacted] wks gestation in with c/o burning pain in gastric area that has been going on for one month.Also slowly becoming more and more constipated.Still has BM's but are very hard and difficult. Denies any lower abd pain, vag bleeding or abnormal vag discharge.  CSN: [id number]  Arrival date and time: 02/09/15 4098   First Provider Initiated Contact with Patient 02/09/15 1943      No chief complaint on file.  HPI  OB History    Gravida Para Term Preterm AB TAB SAB Ectopic Multiple Living   Past Medical History  Diagnosis Date  . DDD (degenerative disc disease)   . Depression   . Herpes   . ADHD (attention deficit hyperactivity disorder)   . Perennial allergic rhinitis   . Vertigo   . Heartburn   . Pregnant 01/21/2015    Past Surgical History  Procedure Laterality Date  . Intrauterine device insertion  2011  . Myringotomy  2011    right  . Cholecystectomy  02/13/2012    Procedure: LAPAROSCOPIC CHOLECYSTECTOMY;  Surgeon: Fabio Bering, MD;  Location: [facility];  Service: General;  Laterality: N/A;    Family History  Problem Relation Age of Onset  . Diabetes Paternal Grandfather   . Heart attack Paternal Grandfather   . Other Paternal Grandfather     aneursym  . Diabetes Paternal Grandmother   . Cancer Paternal Grandmother     breast,lung  . Other Maternal Grandmother     heart issues; has stent; has a eating disorder  . COPD Father   . Other Father     heart issues  . COPD Mother   . Mental illness Sister     History  Substance Use Topics  . Smoking status: Never Smoker   . Smokeless tobacco: Never Used  . Alcohol Use: No    Allergies:  Allergies  Allergen Reactions  . Amoxicillin     Reaction: INEFFECTIVE  . Avelox [Moxifloxacin Hcl In Nacl] Nausea And Vomiting  . Cefdinir     ineffective  . Doxycycline Nausea And Vomiting  . Latex Rash    Prescriptions prior to admission  Medication Sig Dispense Refill Last Dose   . calcium carbonate (TUMS - DOSED IN MG ELEMENTAL CALCIUM) 500 MG chewable tablet Chew by mouth 2 (two) times daily. Takes 2 tabs BID   Taking  . prenatal vitamin w/FE, FA (PRENATAL 1 + 1) 27-1 MG TABS tablet Take 1 tablet by mouth daily at 12 noon. 30 each 11 Taking    Review of Systems  Constitutional: Negative.   HENT: Negative.   Eyes: Negative.   Respiratory: Negative.   Cardiovascular: Negative.   Gastrointestinal: Positive for abdominal pain and constipation.       Abd pain is epigastric in nature  Genitourinary: Negative.   Musculoskeletal: Negative.   Skin: Negative.   Neurological: Negative.   Endo/Heme/Allergies: Negative.   Psychiatric/Behavioral: Negative.    Physical Exam   Blood pressure 125/65, pulse 79, temperature 98.8 F (37.1 C), temperature source Oral, resp. rate 20, height  (1.651 m), weight 200 lb 9.6 oz (90.992 kg), last menstrual period 12/15/2014.  Physical Exam  Constitutional: She is oriented to person, place, and time. She appears well-developed and well-nourished.  HENT:  Head: Normocephalic.  Eyes: Pupils are equal, round, and reactive to light.  Neck: Normal range of motion.  Cardiovascular: Normal  rate, regular rhythm, normal heart sounds and intact distal pulses.   Respiratory: Effort normal and breath sounds normal.  GI: Soft. Bowel sounds are normal.  Genitourinary: Uterus normal.  Musculoskeletal: Normal range of motion.  Neurological: She is alert and oriented to person, place, and time. She has normal reflexes.  Skin: Skin is warm and dry.  Psychiatric: She has a normal mood and affect. Her behavior is normal. Judgment and thought content normal.    MAU Course  Procedures  MDM GERD  Assessment and Plan  GERD and constipation, GI cocktail and start on prilosec and colace. D/C home to follow up PRN  Wells Mabe DARLENE 02/09/2015, 7:47 PM

## 2015-02-09 NOTE — MAU Note (Signed)
Pt complaining of Upper abdominal pain for a month. Distention in abd. Denies vomiting, diarrhea, vaginal bleeding or discharge.

## 2015-02-11 ENCOUNTER — Other Ambulatory Visit (HOSPITAL_COMMUNITY)
Admission: RE | Admit: 2015-02-11 | Discharge: 2015-02-11 | Disposition: A | Discharge status: Home / Self Care | Payer: Medicaid Other | Source: Ambulatory Visit | Attending: Obstetrics & Gynecology | Admitting: Obstetrics & Gynecology

## 2015-02-11 ENCOUNTER — Encounter: Payer: Self-pay | Admitting: Women's Health

## 2015-02-11 ENCOUNTER — Ambulatory Visit (INDEPENDENT_AMBULATORY_CARE_PROVIDER_SITE_OTHER): Payer: Medicaid Other | Admitting: Women's Health

## 2015-02-11 VITALS — BP 120/76 | HR 51 | Ht 64.0 in | Wt 198.5 lb

## 2015-02-11 DIAGNOSIS — Z3490 Encounter for supervision of normal pregnancy, unspecified, unspecified trimester: Secondary | ICD-10-CM | POA: Insufficient documentation

## 2015-02-11 DIAGNOSIS — O26899 Other specified pregnancy related conditions, unspecified trimester: Secondary | ICD-10-CM

## 2015-02-11 DIAGNOSIS — Z331 Pregnant state, incidental: Secondary | ICD-10-CM

## 2015-02-11 DIAGNOSIS — Z3491 Encounter for supervision of normal pregnancy, unspecified, first trimester: Secondary | ICD-10-CM

## 2015-02-11 DIAGNOSIS — Z369 Encounter for antenatal screening, unspecified: Secondary | ICD-10-CM

## 2015-02-11 DIAGNOSIS — Z124 Encounter for screening for malignant neoplasm of cervix: Secondary | ICD-10-CM

## 2015-02-11 DIAGNOSIS — B009 Herpesviral infection, unspecified: Secondary | ICD-10-CM | POA: Insufficient documentation

## 2015-02-11 DIAGNOSIS — Z01419 Encounter for gynecological examination (general) (routine) without abnormal findings: Secondary | ICD-10-CM | POA: Diagnosis not present

## 2015-02-11 DIAGNOSIS — Z6791 Unspecified blood type, Rh negative: Secondary | ICD-10-CM | POA: Insufficient documentation

## 2015-02-11 DIAGNOSIS — O99810 Abnormal glucose complicating pregnancy: Secondary | ICD-10-CM

## 2015-02-11 DIAGNOSIS — O360111 Maternal care for anti-D [Rh] antibodies, first trimester, fetus 1: Secondary | ICD-10-CM

## 2015-02-11 DIAGNOSIS — Z1389 Encounter for screening for other disorder: Secondary | ICD-10-CM

## 2015-02-11 DIAGNOSIS — Z0283 Encounter for blood-alcohol and blood-drug test: Secondary | ICD-10-CM

## 2015-02-11 LAB — POCT URINALYSIS DIPSTICK
Glucose, UA: NEGATIVE
KETONES UA: NEGATIVE
LEUKOCYTES UA: NEGATIVE
NITRITE UA: NEGATIVE
Protein, UA: NEGATIVE
RBC UA: NEGATIVE

## 2015-02-11 NOTE — Patient Instructions (Addendum)
You will have your sugar test next visit.  Please do not eat or drink anything after midnight the night before you come, not even water.  You will be here for at least two hours.     Nausea & Vomiting  Have saltine crackers or pretzels by your bed and eat a few bites before you raise your head out of bed in the morning  Eat small frequent meals throughout the day instead of large meals  Drink plenty of fluids throughout the day to stay hydrated, just don't drink a lot of fluids with your meals.  This can make your stomach fill up faster making you feel sick  Do not brush your teeth right after you eat  Products with real ginger are good for nausea, like ginger ale and ginger hard candy Make sure it says made with real ginger!  Sucking on sour candy like lemon heads is also good for nausea  If your prenatal vitamins make you nauseated, take them at night so you will sleep through the nausea  Sea Bands  If you feel like you need medicine for the nausea & vomiting please let us know  If you are unable to keep any fluids or food down please let us know   Constipation  Drink plenty of fluid, preferably water, throughout the day  Eat foods high in fiber such as fruits, vegetables, and grains  Exercise, such as walking, is a good way to keep your bowels regular  Drink warm fluids, especially warm prune juice, or decaf coffee  Eat a 1/2 cup of real oatmeal (not instant), 1/2 cup applesauce, and 1/2-1 cup warm prune juice every day  If needed, you may take Colace (docusate sodium) stool softener once or twice a day to help keep the stool soft. If you are pregnant, wait until you are out of your first trimester (12-14 weeks of pregnancy)  If you still are having problems with constipation, you may take Miralax once daily as needed to help keep your bowels regular.  If you are pregnant, wait until you are out of your first trimester (12-14 weeks of pregnancy)   First Trimester of  Pregnancy The first trimester of pregnancy is from week 1 until the end of week 12 (months 1 through 3). A week after a sperm fertilizes an egg, the egg will implant on the wall of the uterus. This embryo will begin to develop into a baby. Genes from you and your partner are forming the baby. The female genes determine whether the baby is a boy or a girl. At 6-8 weeks, the eyes and face are formed, and the heartbeat can be seen on ultrasound. At the end of 12 weeks, all the baby's organs are formed.  Now that you are pregnant, you will want to do everything you can to have a healthy baby. Two of the most important things are to get good prenatal care and to follow your health care provider's instructions. Prenatal care is all the medical care you receive before the baby's birth. This care will help prevent, find, and treat any problems during the pregnancy and childbirth. BODY CHANGES Your body goes through many changes during pregnancy. The changes vary from woman to woman.   You may gain or lose a couple of pounds at first.  You may feel sick to your stomach (nauseous) and throw up (vomit). If the vomiting is uncontrollable, call your health care provider.  You may tire easily.  You may   develop headaches that can be relieved by medicines approved by your health care provider.  You may urinate more often. Painful urination may mean you have a bladder infection.  You may develop heartburn as a result of your pregnancy.  You may develop constipation because certain hormones are causing the muscles that push waste through your intestines to slow down.  You may develop hemorrhoids or swollen, bulging veins (varicose veins).  Your breasts may begin to grow larger and become tender. Your nipples may stick out more, and the tissue that surrounds them (areola) may become darker.  Your gums may bleed and may be sensitive to brushing and flossing.  Dark spots or blotches (chloasma, mask of pregnancy)  may develop on your face. This will likely fade after the baby is born.  Your menstrual periods will stop.  You may have a loss of appetite.  You may develop cravings for certain kinds of food.  You may have changes in your emotions from day to day, such as being excited to be pregnant or being concerned that something may go wrong with the pregnancy and baby.  You may have more vivid and strange dreams.  You may have changes in your hair. These can include thickening of your hair, rapid growth, and changes in texture. Some women also have hair loss during or after pregnancy, or hair that feels dry or thin. Your hair will most likely return to normal after your baby is born. WHAT TO EXPECT AT YOUR PRENATAL VISITS During a routine prenatal visit:  You will be weighed to make sure you and the baby are growing normally.  Your blood pressure will be taken.  Your abdomen will be measured to track your baby's growth.  The fetal heartbeat will be listened to starting around week 10 or 12 of your pregnancy.  Test results from any previous visits will be discussed. Your health care provider may ask you:  How you are feeling.  If you are feeling the baby move.  If you have had any abnormal symptoms, such as leaking fluid, bleeding, severe headaches, or abdominal cramping.  If you have any questions. Other tests that may be performed during your first trimester include:  Blood tests to find your blood type and to check for the presence of any previous infections. They will also be used to check for low iron levels (anemia) and Rh antibodies. Later in the pregnancy, blood tests for diabetes will be done along with other tests if problems develop.  Urine tests to check for infections, diabetes, or protein in the urine.  An ultrasound to confirm the proper growth and development of the baby.  An amniocentesis to check for possible genetic problems.  Fetal screens for spina bifida and  Down syndrome.  You may need other tests to make sure you and the baby are doing well. HOME CARE INSTRUCTIONS  Medicines  Follow your health care provider's instructions regarding medicine use. Specific medicines may be either safe or unsafe to take during pregnancy.  Take your prenatal vitamins as directed.  If you develop constipation, try taking a stool softener if your health care provider approves. Diet  Eat regular, well-balanced meals. Choose a variety of foods, such as meat or vegetable-based protein, fish, milk and low-fat dairy products, vegetables, fruits, and whole grain breads and cereals. Your health care provider will help you determine the amount of weight gain that is right for you.  Avoid raw meat and uncooked cheese. These carry germs   that can cause birth defects in the baby.  Eating four or five small meals rather than three large meals a day may help relieve nausea and vomiting. If you start to feel nauseous, eating a few soda crackers can be helpful. Drinking liquids between meals instead of during meals also seems to help nausea and vomiting.  If you develop constipation, eat more high-fiber foods, such as fresh vegetables or fruit and whole grains. Drink enough fluids to keep your urine clear or pale yellow. Activity and Exercise  Exercise only as directed by your health care provider. Exercising will help you:  Control your weight.  Stay in shape.  Be prepared for labor and delivery.  Experiencing pain or cramping in the lower abdomen or low back is a good sign that you should stop exercising. Check with your health care provider before continuing normal exercises.  Try to avoid standing for long periods of time. Move your legs often if you must stand in one place for a long time.  Avoid heavy lifting.  Wear low-heeled shoes, and practice good posture.  You may continue to have sex unless your health care provider directs you otherwise. Relief of Pain  or Discomfort  Wear a good support bra for breast tenderness.   Take warm sitz baths to soothe any pain or discomfort caused by hemorrhoids. Use hemorrhoid cream if your health care provider approves.   Rest with your legs elevated if you have leg cramps or low back pain.  If you develop varicose veins in your legs, wear support hose. Elevate your feet for 15 minutes, 3-4 times a day. Limit salt in your diet. Prenatal Care  Schedule your prenatal visits by the twelfth week of pregnancy. They are usually scheduled monthly at first, then more often in the last 2 months before delivery.  Write down your questions. Take them to your prenatal visits.  Keep all your prenatal visits as directed by your health care provider. Safety  Wear your seat belt at all times when driving.  Make a list of emergency phone numbers, including numbers for family, friends, the hospital, and police and fire departments. General Tips  Ask your health care provider for a referral to a local prenatal education class. Begin classes no later than at the beginning of month 6 of your pregnancy.  Ask for help if you have counseling or nutritional needs during pregnancy. Your health care provider can offer advice or refer you to specialists for help with various needs.  Do not use hot tubs, steam rooms, or saunas.  Do not douche or use tampons or scented sanitary pads.  Do not cross your legs for long periods of time.  Avoid cat litter boxes and soil used by cats. These carry germs that can cause birth defects in the baby and possibly loss of the fetus by miscarriage or stillbirth.  Avoid all smoking, herbs, alcohol, and medicines not prescribed by your health care provider. Chemicals in these affect the formation and growth of the baby.  Schedule a dentist appointment. At home, brush your teeth with a soft toothbrush and be gentle when you floss. SEEK MEDICAL CARE IF:   You have dizziness.  You have mild  pelvic cramps, pelvic pressure, or nagging pain in the abdominal area.  You have persistent nausea, vomiting, or diarrhea.  You have a bad smelling vaginal discharge.  You have pain with urination.  You notice increased swelling in your face, hands, legs, or ankles. SEEK IMMEDIATE MEDICAL   CARE IF:   You have a fever.  You are leaking fluid from your vagina.  You have spotting or bleeding from your vagina.  You have severe abdominal cramping or pain.  You have rapid weight gain or loss.  You vomit blood or material that looks like coffee grounds.  You are exposed to German measles and have never had them.  You are exposed to fifth disease or chickenpox.  You develop a severe headache.  You have shortness of breath.  You have any kind of trauma, such as from a fall or a car accident. Document Released: 11/01/2001 Document Revised: 03/24/2014 Document Reviewed: 09/17/2013 Exit[employer] Patient Information 2015 [employer]. This information is not intended to replace advice given to you by your health care provider. Make sure you discuss any questions you have with your health care provider.   

## 2015-02-11 NOTE — Progress Notes (Signed)
Subjective:  Jasmine Maynard is a 24 y.o. 142P1001 Caucasian female at 4963w6d by 6.6wk u/s, being seen today for her first obstetrical visit.  Her obstetrical history is significant for term uncomplicated SVB x 1 in 2010.  States she was told in ER recently that her fasting glucose was elevated. Denies any h/o DM/GDM. Pregnancy history fully reviewed.  Patient reports nausea, no vomiting- declines meds. Reflux- recently rx'd prilosec. Constipation- recently rx'd colace. . Denies vb, cramping, uti s/s, abnormal/malodorous vag d/c, or vulvovaginal itching/irritation.  BP 120/76 mmHg  Pulse 51  Ht 5\' 4"  (1.626 m)  Wt 198 lb 8 oz (90.039 kg)  BMI 34.06 kg/m2  LMP 12/15/2014  HISTORY: OB History  Gravida Para Term Preterm AB SAB TAB Ectopic Multiple Living  2 1 1       1     # Outcome Date GA Lbr Len/2nd Weight Sex Delivery Anes PTL Lv  2 Current           1 Term  10167w0d  7 lb 9 oz (3.43 kg) F Vag-Spont   Y     Past Medical History  Diagnosis Date  . DDD (degenerative disc disease)   . Depression   . Herpes   . ADHD (attention deficit hyperactivity disorder)   . Perennial allergic rhinitis   . Vertigo   . Heartburn   . Pregnant 01/21/2015   Past Surgical History  Procedure Laterality Date  . Intrauterine device insertion  2011  . Myringotomy  2011    right  . Cholecystectomy  02/13/2012    Procedure: LAPAROSCOPIC CHOLECYSTECTOMY;  Surgeon: Fabio BeringBrent C Ziegler, MD;  Location: [facility];  Service: General;  Laterality: N/A;   Family History  Problem Relation Age of Onset  . Diabetes Paternal Grandfather   . Heart attack Paternal Grandfather   . Other Paternal Grandfather     aneursym  . Diabetes Paternal Grandmother   . Cancer Paternal Grandmother     breast,lung  . Other Maternal Grandmother     heart issues; has stent; has a eating disorder  . COPD Father   . Other Father     heart issues  . COPD Mother   . Mental illness Sister     Exam   System:     General: Well developed  & nourished, no acute distress   Skin: Warm & dry, normal coloration and turgor, no rashes   Neurologic: Alert & oriented, normal mood   Cardiovascular: Regular rate & rhythm   Respiratory: Effort & rate normal, LCTAB, acyanotic   Abdomen: Soft, non tender   Extremities: normal strength, tone   Pelvic Exam:    Perineum: Normal perineum   Vulva: Normal, no lesions   Vagina:  Normal mucosa, normal discharge   Cervix: Normal, bulbous, appears closed   Uterus: Normal size/shape/contour for GA   Thin prep pap smear obtained w/ reflex high risk HPV cotesting  Assessment:   Pregnancy: G2P1001 Patient Active Problem List   Diagnosis Date Noted  . Supervision of normal pregnancy 02/11/2015    Priority: High  . Rh negative state in antepartum period 02/11/2015    Priority: High  . GERD (gastroesophageal reflux disease) 02/09/2015  . Constipation 02/09/2015  . Heartburn 01/21/2015  . Vertigo 01/21/2015  . 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  . Chronic low back pain 09/17/2013  . Depression with anxiety 02/11/2013  . ADHD (attention deficit hyperactivity disorder) 02/11/2013    4963w6d G2P1001 New OB visit Recently elevated fasting  glucose per pt Nausea of pregnancy Constipation  Reflux  Rh neg  Plan:  Initial labs drawn Continue prenatal vitamins Problem list reviewed and updated Reviewed n/v relief measures and warning s/s to report Reviewed recommended weight gain based on pre-gravid BMI Encouraged well-balanced diet Genetic Screening discussed Integrated Screen: declined Cystic fibrosis screening discussed declined Ultrasound discussed; fetal survey: requested Follow up in asap for early 2hr gtt (no visit) d/t recent elevated fasting glucose, then 4wks for ob visit CCNC completed Continue prilosec and colace Declined flu shot  Marge Duncans CNM, [facility] 02/11/2015 3:43 PM

## 2015-02-11 NOTE — Addendum Note (Signed)
Addended by: Criss AlvinePULLIAM, CHRYSTAL G on: 02/11/2015 04:06 PM   Modules accepted: Orders

## 2015-02-12 ENCOUNTER — Other Ambulatory Visit: Payer: Medicaid Other

## 2015-02-12 DIAGNOSIS — O99810 Abnormal glucose complicating pregnancy: Secondary | ICD-10-CM

## 2015-02-13 LAB — PMP SCREEN PROFILE (10S), URINE
AMPHETAMINE SCRN UR: NEGATIVE ng/mL
BENZODIAZEPINE SCREEN, URINE: NEGATIVE ng/mL
Barbiturate Screen, Ur: POSITIVE ng/mL
CANNABINOIDS UR QL SCN: NEGATIVE ng/mL
Cocaine(Metab.)Screen, Urine: NEGATIVE ng/mL
Creatinine(Crt), U: 127.2 mg/dL (ref 20.0–300.0)
METHADONE SCREEN, URINE: NEGATIVE ng/mL
Opiate Scrn, Ur: NEGATIVE ng/mL
Oxycodone+Oxymorphone Ur Ql Scn: NEGATIVE ng/mL
PCP Scrn, Ur: NEGATIVE ng/mL
Ph of Urine: 5.9 (ref 4.5–8.9)
Propoxyphene, Screen: NEGATIVE ng/mL

## 2015-02-13 LAB — URINALYSIS, ROUTINE W REFLEX MICROSCOPIC
Bilirubin, UA: NEGATIVE
GLUCOSE, UA: NEGATIVE
KETONES UA: NEGATIVE
Leukocytes, UA: NEGATIVE
Nitrite, UA: NEGATIVE
Protein, UA: NEGATIVE
RBC, UA: NEGATIVE
Specific Gravity, UA: 1.02 (ref 1.005–1.030)
Urobilinogen, Ur: 0.2 mg/dL (ref 0.2–1.0)
pH, UA: 6.5 (ref 5.0–7.5)

## 2015-02-13 LAB — URINE CULTURE

## 2015-02-13 LAB — GLUCOSE TOLERANCE, 2 HOURS W/ 1HR
Glucose, 1 hour: 106 mg/dL (ref 65–179)
Glucose, 2 hour: 60 mg/dL — ABNORMAL LOW (ref 65–152)
Glucose, Fasting: 75 mg/dL (ref 65–91)

## 2015-02-13 LAB — GC/CHLAMYDIA PROBE AMP
Chlamydia trachomatis, NAA: NEGATIVE
Neisseria gonorrhoeae by PCR: NEGATIVE

## 2015-02-13 LAB — CYTOLOGY - PAP

## 2015-02-13 LAB — CBC
HCT: 39.9 % (ref 34.0–46.6)
Hemoglobin: 13.5 g/dL (ref 11.1–15.9)
MCH: 29.3 pg (ref 26.6–33.0)
MCHC: 33.8 g/dL (ref 31.5–35.7)
MCV: 87 fL (ref 79–97)
PLATELETS: 234 10*3/uL (ref 150–379)
RBC: 4.6 x10E6/uL (ref 3.77–5.28)
RDW: 12.5 % (ref 12.3–15.4)
WBC: 7.4 10*3/uL (ref 3.4–10.8)

## 2015-02-13 LAB — HEPATITIS B SURFACE ANTIGEN: HEP B S AG: NEGATIVE

## 2015-02-13 LAB — AB SCR+ANTIBODY ID: Antibody Screen: POSITIVE — AB

## 2015-02-13 LAB — ABO/RH: Rh Factor: NEGATIVE

## 2015-02-13 LAB — HIV ANTIBODY (ROUTINE TESTING W REFLEX): HIV SCREEN 4TH GENERATION: NONREACTIVE

## 2015-02-13 LAB — RPR: RPR Ser Ql: NONREACTIVE

## 2015-02-13 LAB — RUBELLA SCREEN: RUBELLA: 2.08 {index} (ref >0.99)

## 2015-02-13 LAB — VARICELLA ZOSTER ANTIBODY, IGG: Varicella zoster IgG: >4000 index (ref >165)

## 2015-02-13 LAB — ANTIBODY SCREEN

## 2015-02-16 LAB — SPECIMEN STATUS REPORT

## 2015-02-25 ENCOUNTER — Encounter: Payer: Self-pay | Admitting: Advanced Practice Midwife

## 2015-02-25 ENCOUNTER — Ambulatory Visit (INDEPENDENT_AMBULATORY_CARE_PROVIDER_SITE_OTHER): Payer: Medicaid Other | Admitting: Advanced Practice Midwife

## 2015-02-25 VITALS — BP 100/70 | HR 84 | Temp 98.2°F | Wt 201.5 lb

## 2015-02-25 DIAGNOSIS — Z1389 Encounter for screening for other disorder: Secondary | ICD-10-CM

## 2015-02-25 DIAGNOSIS — Z331 Pregnant state, incidental: Secondary | ICD-10-CM

## 2015-02-25 DIAGNOSIS — J302 Other seasonal allergic rhinitis: Secondary | ICD-10-CM | POA: Diagnosis not present

## 2015-02-25 LAB — POCT URINALYSIS DIPSTICK
Glucose, UA: NEGATIVE
Ketones, UA: NEGATIVE
NITRITE UA: NEGATIVE
Protein, UA: NEGATIVE

## 2015-02-25 NOTE — Progress Notes (Signed)
WORK IN FOR CONGESTION, NASAL DRAINAGE FOR 1 WEEK  Feels like it's allergies.  Ears normal, throat sl erythemous, lymph nodes normal, LCTAB.  Pt looks well.  Started Prilosec last week for "severe" heartburn, feels much better.   A:  Allergies P: non medicinal tx for a few weeks d/t early gestation.  F/U as scheduled

## 2015-03-05 ENCOUNTER — Telehealth: Payer: Self-pay | Admitting: Advanced Practice Midwife

## 2015-03-05 NOTE — Telephone Encounter (Signed)
claritan  Or zyrtec is ok at 11 weeks

## 2015-03-05 NOTE — Telephone Encounter (Signed)
Pt states been sick x 3 weeks started out as allergies, now coughing thick yellowish mucus. Pt states she thinks she has a sinus infection. Pt given an appt for tomorrow for evaluation.

## 2015-03-06 ENCOUNTER — Ambulatory Visit (INDEPENDENT_AMBULATORY_CARE_PROVIDER_SITE_OTHER): Payer: Medicaid Other | Admitting: Obstetrics and Gynecology

## 2015-03-06 VITALS — BP 120/80 | HR 120 | Temp 98.8°F | Wt 198.0 lb

## 2015-03-06 DIAGNOSIS — Z1389 Encounter for screening for other disorder: Secondary | ICD-10-CM

## 2015-03-06 DIAGNOSIS — Z3491 Encounter for supervision of normal pregnancy, unspecified, first trimester: Secondary | ICD-10-CM

## 2015-03-06 DIAGNOSIS — Z331 Pregnant state, incidental: Secondary | ICD-10-CM

## 2015-03-06 LAB — POCT URINALYSIS DIPSTICK
Glucose, UA: NEGATIVE
KETONES UA: NEGATIVE
Leukocytes, UA: NEGATIVE
Nitrite, UA: NEGATIVE
Protein, UA: NEGATIVE
RBC UA: NEGATIVE

## 2015-03-06 MED ORDER — HYDROCODONE-HOMATROPINE 5-1.5 MG/5ML PO SYRP: 5.0000 mL | ORAL_SOLUTION | Freq: Four times a day (QID) | ORAL | Status: DC | PRN

## 2015-03-06 MED ORDER — GUAIFENESIN 100 MG/5ML PO LIQD: 200.0000 mg | Freq: Three times a day (TID) | ORAL | Status: DC | PRN

## 2015-03-06 NOTE — Progress Notes (Signed)
Patient ID: Jasmine Maynard, female   DOB: 02/18/1991, 24 y.o.   MRN: [id number]  This chart was SCRIBED for Christin BachJohn Cayne Yom, MD by Ronney LionSuzanne Le, ED Scribe. This patient was seen in room 1 and the patient's care was started at 10:30 AM.    [facility]  Patient name: Jasmine Maynard MRN [id number]  Date of birth: 10/07/1991  CC & HPI:  Jasmine Maynard is a 24 y.o. female who is 781w1d pregnant presenting today for a constant cough productive with yellow sputum that has been ongoing for 3 weeks. Her symptoms started out as allergies and seemed to become Patient was seen and evaluated and told to come here in the case it was sinusitis.  ROS:  A complete 10 system review of systems was obtained and all systems are negative except as noted in the HPI and PMH.    Pertinent History Reviewed:   Reviewed: Significant for IUD insertion Medical         Past Medical History  Diagnosis Date  . DDD (degenerative disc disease)   . Depression   . Herpes   . ADHD (attention deficit hyperactivity disorder)   . Perennial allergic rhinitis   . Vertigo   . Heartburn   . Pregnant 01/21/2015                              Surgical Hx:    Past Surgical History  Procedure Laterality Date  . Intrauterine device insertion  2011  . Myringotomy  2011    right  . Cholecystectomy  02/13/2012    Procedure: LAPAROSCOPIC CHOLECYSTECTOMY;  Surgeon: Fabio BeringBrent C Ziegler, MD;  Location: [facility];  Service: General;  Laterality: N/A;   Medications: Reviewed & Updated - see associated section                       Current outpatient prescriptions:  .  docusate sodium (COLACE) 100 MG capsule, Take 1 capsule (100 mg total) by mouth every 12 (twelve) hours., Disp: 60 capsule, Rfl: 11 .  omeprazole (PRILOSEC) 20 MG capsule, Take 1 capsule (20 mg total) by mouth 2 (two) times daily before a meal., Disp: 60 capsule, Rfl: 11 .  prenatal vitamin w/FE, FA (PRENATAL 1 + 1) 27-1 MG TABS tablet, Take 1 tablet by mouth daily at 12 noon.,  Disp: 30 each, Rfl: 11 .  [DISCONTINUED] dicyclomine (BENTYL) 20 MG tablet, Take 1 tablet (20 mg total) by mouth every 6 (six) hours as needed (abdominal cramping)., Disp: 15 tablet, Rfl: 0   Social History: Reviewed -  reports that she has never smoked. She has never used smokeless tobacco.  Objective Findings:  Vitals: Blood pressure 120/80, pulse 120, temperature 98.8 F (37.1 C), weight 198 lb (89.812 kg), last menstrual period 12/15/2014.  Physical Examination: General appearance - alert, well appearing, and in no distress, oriented to person, place, and time and normal appearing weight Mental status - alert, oriented to person, place, and time, normal mood, behavior, speech, dress, motor activity, and thought processes, affect appropriate to mood Chest - clear to auscultation, no wheezes, rales or rhonchi, symmetric air entry; dry, non-productive cough recurrent every few seconds  FHR: 147 bpm   Assessment & Plan:   A: URI, bronchitis   no asthma 1. Pregnancy 351w1d  P:  1. Rx Hycodan and guaifenesin; advised patient to use humidifier in house   I personally performed the  services described in this documentation, which was SCRIBED in my presence. The recorded information has been reviewed and considered accurate. It has been edited as necessary during review. Tilda Burrow, MD

## 2015-03-06 NOTE — Progress Notes (Signed)
Pt states that she has been coughing for about 3 weeks. Pt states that when she coughs stuff up it is yellow.

## 2015-03-06 NOTE — Patient Instructions (Signed)
Use as directed

## 2015-03-11 ENCOUNTER — Encounter: Payer: Self-pay | Admitting: Women's Health

## 2015-03-11 ENCOUNTER — Ambulatory Visit (INDEPENDENT_AMBULATORY_CARE_PROVIDER_SITE_OTHER): Payer: Medicaid Other | Admitting: Women's Health

## 2015-03-11 VITALS — BP 118/60 | HR 92 | Wt 202.0 lb

## 2015-03-11 DIAGNOSIS — Z1389 Encounter for screening for other disorder: Secondary | ICD-10-CM

## 2015-03-11 DIAGNOSIS — Z3491 Encounter for supervision of normal pregnancy, unspecified, first trimester: Secondary | ICD-10-CM

## 2015-03-11 DIAGNOSIS — Z369 Encounter for antenatal screening, unspecified: Secondary | ICD-10-CM

## 2015-03-11 DIAGNOSIS — Z331 Pregnant state, incidental: Secondary | ICD-10-CM

## 2015-03-11 LAB — POCT URINALYSIS DIPSTICK
GLUCOSE UA: NEGATIVE
Nitrite, UA: NEGATIVE
Protein, UA: NEGATIVE

## 2015-03-11 NOTE — Patient Instructions (Signed)
Humidifier, saline nasal spray, zyrtec or claritin  First Trimester of Pregnancy The first trimester of pregnancy is from week 1 until the end of week 12 (months 1 through 3). A week after a sperm fertilizes an egg, the egg will implant on the wall of the uterus. This embryo will begin to develop into a baby. Genes from you and your partner are forming the baby. The female genes determine whether the baby is a boy or a girl. At 6-8 weeks, the eyes and face are formed, and the heartbeat can be seen on ultrasound. At the end of 12 weeks, all the baby's organs are formed.  Now that you are pregnant, you will want to do everything you can to have a healthy baby. Two of the most important things are to get good prenatal care and to follow your health care provider's instructions. Prenatal care is all the medical care you receive before the baby's birth. This care will help prevent, find, and treat any problems during the pregnancy and childbirth. BODY CHANGES Your body goes through many changes during pregnancy. The changes vary from woman to woman.   You may gain or lose a couple of pounds at first.  You may feel sick to your stomach (nauseous) and throw up (vomit). If the vomiting is uncontrollable, call your health care provider.  You may tire easily.  You may develop headaches that can be relieved by medicines approved by your health care provider.  You may urinate more often. Painful urination may mean you have a bladder infection.  You may develop heartburn as a result of your pregnancy.  You may develop constipation because certain hormones are causing the muscles that push waste through your intestines to slow down.  You may develop hemorrhoids or swollen, bulging veins (varicose veins).  Your breasts may begin to grow larger and become tender. Your nipples may stick out more, and the tissue that surrounds them (areola) may become darker.  Your gums may bleed and may be sensitive to  brushing and flossing.  Dark spots or blotches (chloasma, mask of pregnancy) may develop on your face. This will likely fade after the baby is born.  Your menstrual periods will stop.  You may have a loss of appetite.  You may develop cravings for certain kinds of food.  You may have changes in your emotions from day to day, such as being excited to be pregnant or being concerned that something may go wrong with the pregnancy and baby.  You may have more vivid and strange dreams.  You may have changes in your hair. These can include thickening of your hair, rapid growth, and changes in texture. Some women also have hair loss during or after pregnancy, or hair that feels dry or thin. Your hair will most likely return to normal after your baby is born. WHAT TO EXPECT AT YOUR PRENATAL VISITS During a routine prenatal visit:  You will be weighed to make sure you and the baby are growing normally.  Your blood pressure will be taken.  Your abdomen will be measured to track your baby's growth.  The fetal heartbeat will be listened to starting around week 10 or 12 of your pregnancy.  Test results from any previous visits will be discussed. Your health care provider may ask you:  How you are feeling.  If you are feeling the baby move.  If you have had any abnormal symptoms, such as leaking fluid, bleeding, severe headaches, or abdominal cramping.  If you have any questions. Other tests that may be performed during your first trimester include:  Blood tests to find your blood type and to check for the presence of any previous infections. They will also be used to check for low iron levels (anemia) and Rh antibodies. Later in the pregnancy, blood tests for diabetes will be done along with other tests if problems develop.  Urine tests to check for infections, diabetes, or protein in the urine.  An ultrasound to confirm the proper growth and development of the baby.  An amniocentesis  to check for possible genetic problems.  Fetal screens for spina bifida and Down syndrome.  You may need other tests to make sure you and the baby are doing well. HOME CARE INSTRUCTIONS  Medicines  Follow your health care provider's instructions regarding medicine use. Specific medicines may be either safe or unsafe to take during pregnancy.  Take your prenatal vitamins as directed.  If you develop constipation, try taking a stool softener if your health care provider approves. Diet  Eat regular, well-balanced meals. Choose a variety of foods, such as meat or vegetable-based protein, fish, milk and low-fat dairy products, vegetables, fruits, and whole grain breads and cereals. Your health care provider will help you determine the amount of weight gain that is right for you.  Avoid raw meat and uncooked cheese. These carry germs that can cause birth defects in the baby.  Eating four or five small meals rather than three large meals a day may help relieve nausea and vomiting. If you start to feel nauseous, eating a few soda crackers can be helpful. Drinking liquids between meals instead of during meals also seems to help nausea and vomiting.  If you develop constipation, eat more high-fiber foods, such as fresh vegetables or fruit and whole grains. Drink enough fluids to keep your urine clear or pale yellow. Activity and Exercise  Exercise only as directed by your health care provider. Exercising will help you:  Control your weight.  Stay in shape.  Be prepared for labor and delivery.  Experiencing pain or cramping in the lower abdomen or low back is a good sign that you should stop exercising. Check with your health care provider before continuing normal exercises.  Try to avoid standing for long periods of time. Move your legs often if you must stand in one place for a long time.  Avoid heavy lifting.  Wear low-heeled shoes, and practice good posture.  You may continue to have  sex unless your health care provider directs you otherwise. Relief of Pain or Discomfort  Wear a good support bra for breast tenderness.   Take warm sitz baths to soothe any pain or discomfort caused by hemorrhoids. Use hemorrhoid cream if your health care provider approves.   Rest with your legs elevated if you have leg cramps or low back pain.  If you develop varicose veins in your legs, wear support hose. Elevate your feet for 15 minutes, 3-4 times a day. Limit salt in your diet. Prenatal Care  Schedule your prenatal visits by the twelfth week of pregnancy. They are usually scheduled monthly at first, then more often in the last 2 months before delivery.  Write down your questions. Take them to your prenatal visits.  Keep all your prenatal visits as directed by your health care provider. Safety  Wear your seat belt at all times when driving.  Make a list of emergency phone numbers, including numbers for family, friends, the hospital,  and police and fire departments. General Tips  Ask your health care provider for a referral to a local prenatal education class. Begin classes no later than at the beginning of month 6 of your pregnancy.  Ask for help if you have counseling or nutritional needs during pregnancy. Your health care provider can offer advice or refer you to specialists for help with various needs.  Do not use hot tubs, steam rooms, or saunas.  Do not douche or use tampons or scented sanitary pads.  Do not cross your legs for long periods of time.  Avoid cat litter boxes and soil used by cats. These carry germs that can cause birth defects in the baby and possibly loss of the fetus by miscarriage or stillbirth.  Avoid all smoking, herbs, alcohol, and medicines not prescribed by your health care provider. Chemicals in these affect the formation and growth of the baby.  Schedule a dentist appointment. At home, brush your teeth with a soft toothbrush and be gentle when  you floss. SEEK MEDICAL CARE IF:   You have dizziness.  You have mild pelvic cramps, pelvic pressure, or nagging pain in the abdominal area.  You have persistent nausea, vomiting, or diarrhea.  You have a bad smelling vaginal discharge.  You have pain with urination.  You notice increased swelling in your face, hands, legs, or ankles. SEEK IMMEDIATE MEDICAL CARE IF:   You have a fever.  You are leaking fluid from your vagina.  You have spotting or bleeding from your vagina.  You have severe abdominal cramping or pain.  You have rapid weight gain or loss.  You vomit blood or material that looks like coffee grounds.  You are exposed to [place] measles and have never had them.  You are exposed to fifth disease or chickenpox.  You develop a severe headache.  You have shortness of breath.  You have any kind of trauma, such as from a fall or a car accident. Document Released: 11/01/2001 Document Revised: 03/24/2014 Document Reviewed: 09/17/2013 [facility] Patient Information 2015 [place], [place]. This information is not intended to replace advice given to you by your health care provider. Make sure you discuss any questions you have with your health care provider.

## 2015-03-11 NOTE — Progress Notes (Signed)
Low-risk OB appointment G2P1001 [id number] Estimated Date of Delivery: 09/24/15 BP 118/60 mmHg  Pulse 92  Wt 202 lb (91.627 kg)  LMP 12/15/2014  BP, weight, and urine reviewed.  Refer to obstetrical flow sheet for FH & FHR.  No fm yet. Denies cramping, lof, vb, or uti s/s. Cough- given hycodan the other day by [facility], helps some. Lots of postnasal drainage. To try claritin or zyrtec.  +barbiturates at new ob visit- discussed w/ pt, very surprised and has no idea where it came from. Hasn't taken anyone else's meds, or any street meds, etc. Will recheck today.  Reviewed warning s/s to report. Plan:  Continue routine obstetrical care  F/U in 4wks for OB appointment  Declines genetic screening

## 2015-04-08 ENCOUNTER — Encounter: Payer: Medicaid Other | Admitting: Women's Health

## 2015-04-09 ENCOUNTER — Ambulatory Visit (INDEPENDENT_AMBULATORY_CARE_PROVIDER_SITE_OTHER): Payer: Medicaid Other | Admitting: Obstetrics and Gynecology

## 2015-04-09 ENCOUNTER — Encounter: Payer: Self-pay | Admitting: Obstetrics and Gynecology

## 2015-04-09 VITALS — BP 120/58 | HR 80 | Wt 202.0 lb

## 2015-04-09 DIAGNOSIS — R825 Elevated urine levels of drugs, medicaments and biological substances: Secondary | ICD-10-CM

## 2015-04-09 DIAGNOSIS — Z1389 Encounter for screening for other disorder: Secondary | ICD-10-CM

## 2015-04-09 DIAGNOSIS — Z331 Pregnant state, incidental: Secondary | ICD-10-CM

## 2015-04-09 DIAGNOSIS — Z3402 Encounter for supervision of normal first pregnancy, second trimester: Secondary | ICD-10-CM

## 2015-04-09 LAB — POCT URINALYSIS DIPSTICK
Glucose, UA: NEGATIVE
Ketones, UA: NEGATIVE
Leukocytes, UA: NEGATIVE
Nitrite, UA: NEGATIVE
Protein, UA: NEGATIVE
RBC UA: NEGATIVE

## 2015-04-09 NOTE — Progress Notes (Signed)
Patient ID: Jasmine PhenixRhyanna Stolar, female   DOB: 01/20/1991, 24 y.o.   MRN: [id number]  G2P1001 9628w0d Estimated Date of Delivery: 09/24/15  Blood pressure 120/58, pulse 80, weight 202 lb (91.627 kg), last menstrual period 12/15/2014.    refer to the ob flow sheet for FH and FHR, also BP, Wt, Urine results:notable for negative  Patient reports  + good fetal movement, denies any bleeding and no rupture of membranes symptoms or regular contractions. Patient complaints: Patient notes she has been having moderate diarrhea and abdominal cramps for a couple days that she thinks may be due to a stomach viral infection. The abdominal cramps have since resolved. She otherwise has no complaints at this time.   FHR: 150 bpm   Questions were answered. Assessment: Pregnancy 16w, LROB Plan:  Continued routine obstetrical care  F/u in 4 weeks for pnx care; schedule for anatomy U/S  This chart was SCRIBED for Christin BachJohn Tajay Muzzy, MD by Ronney LionSuzanne Le, ED Scribe. This patient was seen in room 3 and the patient's care was started at 3:44 PM.  I personally performed the services described in this documentation, which was SCRIBED in my presence. The recorded information has been reviewed and considered accurate. It has been edited as necessary during review. Tilda BurrowFERGUSON,Jaquis Picklesimer V, MD

## 2015-04-09 NOTE — Progress Notes (Signed)
Pt denies any problems or concerns at this time.  

## 2015-04-10 LAB — PMP SCREEN PROFILE (10S), URINE
Amphetamine Screen, Ur: NEGATIVE ng/mL
Barbiturate Screen, Ur: NEGATIVE ng/mL
Benzodiazepine Screen, Urine: NEGATIVE ng/mL
Cannabinoids Ur Ql Scn: NEGATIVE ng/mL
Cocaine(Metab.)Screen, Urine: NEGATIVE ng/mL
Creatinine(Crt), U: 36.1 mg/dL (ref 20.0–300.0)
Methadone Scn, Ur: NEGATIVE ng/mL
OXYCODONE+OXYMORPHONE UR QL SCN: NEGATIVE ng/mL
Opiate Scrn, Ur: NEGATIVE ng/mL
PCP SCRN UR: NEGATIVE ng/mL
PROPOXYPHENE SCREEN: NEGATIVE ng/mL
Ph of Urine: 6 (ref 4.5–8.9)

## 2015-05-07 ENCOUNTER — Ambulatory Visit (INDEPENDENT_AMBULATORY_CARE_PROVIDER_SITE_OTHER): Payer: Medicaid Other | Admitting: Advanced Practice Midwife

## 2015-05-07 ENCOUNTER — Encounter: Payer: Self-pay | Admitting: Advanced Practice Midwife

## 2015-05-07 VITALS — BP 120/70 | HR 88 | Wt 205.0 lb

## 2015-05-07 DIAGNOSIS — Z331 Pregnant state, incidental: Secondary | ICD-10-CM

## 2015-05-07 DIAGNOSIS — Z363 Encounter for antenatal screening for malformations: Secondary | ICD-10-CM

## 2015-05-07 DIAGNOSIS — Z3492 Encounter for supervision of normal pregnancy, unspecified, second trimester: Secondary | ICD-10-CM

## 2015-05-07 DIAGNOSIS — Z1389 Encounter for screening for other disorder: Secondary | ICD-10-CM

## 2015-05-07 LAB — POCT URINALYSIS DIPSTICK
Glucose, UA: NEGATIVE
Ketones, UA: NEGATIVE
Leukocytes, UA: NEGATIVE
Nitrite, UA: NEGATIVE
Protein, UA: NEGATIVE
RBC UA: NEGATIVE

## 2015-05-07 NOTE — Progress Notes (Signed)
Pt states that she has been having pain on her lower right side. "it feels like somebody is digging my ovary out with a spoon"

## 2015-05-07 NOTE — Patient Instructions (Signed)
Call [phone] and ask for radiology scheduling to schedule Ultrasound

## 2015-05-07 NOTE — Progress Notes (Signed)
G2P1001 [redacted]w[redacted]d Estimated Date of Delivery: 09/24/15  Blood pressure 120/70, pulse 88, weight 205 lb (92.987 kg), last menstrual period 12/15/2014.   BP weight and urine results all reviewed and noted.  Please refer to the obstetrical flow sheet for the fundal height and fetal heart rate documentation:  Patient reports good fetal movement, denies any bleeding and no rupture of membranes symptoms or regular contractions. Patient is without complaints.other than what sounds like round ligament pain.  Front office error resulted in anatomy scan not being scheudled, and next available appt 6/27.  Tried BS [place] ? boy All questions were answered.  Plan:  Continued routine obstetrical care, ordered anatomy scan at [facility]  Follow up in 4 weeks for OB appointment,

## 2015-05-11 ENCOUNTER — Ambulatory Visit: Payer: Medicaid Other

## 2015-05-13 ENCOUNTER — Ambulatory Visit (HOSPITAL_COMMUNITY)
Admission: RE | Admit: 2015-05-13 | Discharge: 2015-05-13 | Disposition: A | Discharge status: Home / Self Care | Payer: Medicaid Other | Source: Ambulatory Visit | Attending: Advanced Practice Midwife | Admitting: Advanced Practice Midwife

## 2015-05-13 ENCOUNTER — Other Ambulatory Visit: Payer: Medicaid Other

## 2015-05-13 DIAGNOSIS — Z3A20 20 weeks gestation of pregnancy: Secondary | ICD-10-CM | POA: Insufficient documentation

## 2015-05-13 DIAGNOSIS — O360120 Maternal care for anti-D [Rh] antibodies, second trimester, not applicable or unspecified: Secondary | ICD-10-CM | POA: Diagnosis not present

## 2015-05-13 DIAGNOSIS — Z3689 Encounter for other specified antenatal screening: Secondary | ICD-10-CM | POA: Insufficient documentation

## 2015-05-13 DIAGNOSIS — Z36 Encounter for antenatal screening of mother: Secondary | ICD-10-CM | POA: Insufficient documentation

## 2015-05-13 DIAGNOSIS — O360990 Maternal care for other rhesus isoimmunization, unspecified trimester, not applicable or unspecified: Secondary | ICD-10-CM | POA: Insufficient documentation

## 2015-06-04 ENCOUNTER — Encounter: Payer: Self-pay | Admitting: Obstetrics & Gynecology

## 2015-06-04 ENCOUNTER — Encounter: Payer: Medicaid Other | Admitting: Obstetrics & Gynecology

## 2015-06-17 ENCOUNTER — Ambulatory Visit (INDEPENDENT_AMBULATORY_CARE_PROVIDER_SITE_OTHER): Payer: Medicaid Other | Admitting: Advanced Practice Midwife

## 2015-06-17 VITALS — BP 126/60 | HR 92 | Wt 211.2 lb

## 2015-06-17 DIAGNOSIS — Z331 Pregnant state, incidental: Secondary | ICD-10-CM

## 2015-06-17 DIAGNOSIS — Z3492 Encounter for supervision of normal pregnancy, unspecified, second trimester: Secondary | ICD-10-CM

## 2015-06-17 DIAGNOSIS — Z1389 Encounter for screening for other disorder: Secondary | ICD-10-CM

## 2015-06-17 LAB — POCT URINALYSIS DIPSTICK
Blood, UA: NEGATIVE
Glucose, UA: NEGATIVE
Ketones, UA: NEGATIVE
Leukocytes, UA: NEGATIVE
NITRITE UA: NEGATIVE
Protein, UA: NEGATIVE

## 2015-06-17 NOTE — Progress Notes (Signed)
G2P1001 [redacted]w[redacted]d Estimated Date of Delivery: 09/24/15  Blood pressure 126/60, pulse 92, weight 211 lb 3.2 oz (95.8 kg), last menstrual period 12/15/2014.   BP weight and urine results all reviewed and noted.  Please refer to the obstetrical flow sheet for the fundal height and fetal heart rate documentation:  Patient reports good fetal movement, denies any bleeding and no rupture of membranes symptoms or regular contractions. Patient is without complaints. All questions were answered.  Plan:  Continued routine obstetrical care,   Follow up in 2 weeks for OB appointment, PN2

## 2015-06-17 NOTE — Patient Instructions (Signed)

## 2015-07-01 ENCOUNTER — Other Ambulatory Visit: Payer: Medicaid Other

## 2015-07-01 ENCOUNTER — Encounter: Payer: Self-pay | Admitting: Advanced Practice Midwife

## 2015-07-01 ENCOUNTER — Ambulatory Visit (INDEPENDENT_AMBULATORY_CARE_PROVIDER_SITE_OTHER): Payer: Medicaid Other | Admitting: Advanced Practice Midwife

## 2015-07-01 VITALS — BP 90/50 | HR 76 | Wt 215.0 lb

## 2015-07-01 DIAGNOSIS — Z1389 Encounter for screening for other disorder: Secondary | ICD-10-CM

## 2015-07-01 DIAGNOSIS — Z3492 Encounter for supervision of normal pregnancy, unspecified, second trimester: Secondary | ICD-10-CM

## 2015-07-01 DIAGNOSIS — Z131 Encounter for screening for diabetes mellitus: Secondary | ICD-10-CM

## 2015-07-01 DIAGNOSIS — Z331 Pregnant state, incidental: Secondary | ICD-10-CM

## 2015-07-01 DIAGNOSIS — Z369 Encounter for antenatal screening, unspecified: Secondary | ICD-10-CM

## 2015-07-01 LAB — POCT URINALYSIS DIPSTICK
Blood, UA: NEGATIVE
Glucose, UA: NEGATIVE
Ketones, UA: NEGATIVE
Leukocytes, UA: NEGATIVE
Nitrite, UA: NEGATIVE

## 2015-07-01 MED ORDER — ALBUTEROL SULFATE HFA 108 (90 BASE) MCG/ACT IN AERS: 2.0000 | INHALATION_SPRAY | Freq: Four times a day (QID) | RESPIRATORY_TRACT | Status: DC | PRN

## 2015-07-01 NOTE — Progress Notes (Signed)
[id number] [redacted]w[redacted]d Estimated Date of Delivery: 09/24/15  Last menstrual period 12/15/2014.   BP weight and urine results all reviewed and noted.  Please refer to the obstetrical flow sheet for the fundal height and fetal heart rate documentation:  Patient reports good fetal movement, denies any bleeding and no rupture of membranes symptoms or regular contractions. Patient c/o all day coughing spells, dry cough.  Has had success treating with an inhaler. Sounds like it could be reflux. Also C/O RUQ pain that radiates to her back.  Had gallbladder removed, but sounds like that type of pain.  Tender to light touch but also "feels deep".  Declined lidocaine injection All questions were answered.  Plan:  Continued routine obstetrical care, PN2 today.  Albuterol inhaler PRN, continue zyrtec.  Consider changing to nexium.  Has a TENS unit  Follow up in 3 weeks for OB appointment,

## 2015-07-02 LAB — GLUCOSE TOLERANCE, 2 HOURS W/ 1HR
GLUCOSE, 2 HOUR: 79 mg/dL (ref 65–152)
Glucose, 1 hour: 113 mg/dL (ref 65–179)
Glucose, Fasting: 72 mg/dL (ref 65–91)

## 2015-07-02 LAB — HSV 2 ANTIBODY, IGG: HSV 2 GLYCOPROTEIN G AB, IGG: 4.01 {index} — AB (ref 0.00–0.90)

## 2015-07-02 LAB — CBC
HEMATOCRIT: 32.5 % — AB (ref 34.0–46.6)
Hemoglobin: 11.9 g/dL (ref 11.1–15.9)
MCH: 31.1 pg (ref 26.6–33.0)
MCHC: 36.6 g/dL — AB (ref 31.5–35.7)
MCV: 85 fL (ref 79–97)
PLATELETS: 244 10*3/uL (ref 150–379)
RBC: 3.83 x10E6/uL (ref 3.77–5.28)
RDW: 12.4 % (ref 12.3–15.4)
WBC: 7.4 10*3/uL (ref 3.4–10.8)

## 2015-07-02 LAB — ANTIBODY SCREEN: Antibody Screen: NEGATIVE

## 2015-07-02 LAB — HIV ANTIBODY (ROUTINE TESTING W REFLEX): HIV SCREEN 4TH GENERATION: NONREACTIVE

## 2015-07-02 LAB — RPR: RPR Ser Ql: NONREACTIVE

## 2015-07-05 ENCOUNTER — Emergency Department (HOSPITAL_COMMUNITY)
Admission: EM | Admit: 2015-07-05 | Discharge: 2015-07-05 | Disposition: A | Discharge status: Home / Self Care | Payer: Medicaid Other | Attending: Emergency Medicine | Admitting: Emergency Medicine

## 2015-07-05 ENCOUNTER — Encounter (HOSPITAL_COMMUNITY): Payer: Self-pay | Admitting: Emergency Medicine

## 2015-07-05 DIAGNOSIS — Z79899 Other long term (current) drug therapy: Secondary | ICD-10-CM | POA: Diagnosis not present

## 2015-07-05 DIAGNOSIS — J40 Bronchitis, not specified as acute or chronic: Secondary | ICD-10-CM

## 2015-07-05 DIAGNOSIS — F329 Major depressive disorder, single episode, unspecified: Secondary | ICD-10-CM | POA: Insufficient documentation

## 2015-07-05 DIAGNOSIS — Z880 Allergy status to penicillin: Secondary | ICD-10-CM | POA: Insufficient documentation

## 2015-07-05 DIAGNOSIS — R05 Cough: Secondary | ICD-10-CM | POA: Diagnosis present

## 2015-07-05 DIAGNOSIS — J209 Acute bronchitis, unspecified: Secondary | ICD-10-CM | POA: Insufficient documentation

## 2015-07-05 DIAGNOSIS — Z8739 Personal history of other diseases of the musculoskeletal system and connective tissue: Secondary | ICD-10-CM | POA: Insufficient documentation

## 2015-07-05 DIAGNOSIS — Z8619 Personal history of other infectious and parasitic diseases: Secondary | ICD-10-CM | POA: Diagnosis not present

## 2015-07-05 DIAGNOSIS — Z91040 Latex allergy status: Secondary | ICD-10-CM | POA: Insufficient documentation

## 2015-07-05 MED ORDER — ACETAMINOPHEN 325 MG PO TABS
650.0000 mg | ORAL_TABLET | Freq: Once | ORAL | Status: AC
  Administered 2015-07-05: 650 mg via ORAL
  Filled 2015-07-05: qty 2

## 2015-07-05 MED ORDER — ALBUTEROL SULFATE (2.5 MG/3ML) 0.083% IN NEBU
5.0000 mg | INHALATION_SOLUTION | RESPIRATORY_TRACT | Status: AC
  Administered 2015-07-05: 5 mg via RESPIRATORY_TRACT
  Filled 2015-07-05: qty 6

## 2015-07-05 MED ORDER — CEPHALEXIN 500 MG PO CAPS: 500.0000 mg | ORAL_CAPSULE | Freq: Four times a day (QID) | ORAL | Status: DC

## 2015-07-05 MED ORDER — HYDROCODONE-HOMATROPINE 5-1.5 MG/5ML PO SYRP: 5.0000 mL | ORAL_SOLUTION | Freq: Four times a day (QID) | ORAL | Status: DC | PRN

## 2015-07-05 NOTE — ED Provider Notes (Signed)
CSN: [id number]     Arrival date & time 07/05/15  0808 History  This chart was scribed for Jasmine Berkshire, MD by Littie Deeds, ED Scribe. This patient was seen in room APA06/APA06 and the patient's care was started at 9:27 AM.       Chief Complaint  Patient presents with  . Cough   Patient is a 24 y.o. female presenting with cough. The history is provided by the patient. No language interpreter was used.  Cough Cough characteristics:  Productive Onset quality:  Gradual Timing:  Intermittent Progression:  Worsening Chronicity:  New Associated symptoms: myalgias and sore throat   Associated symptoms: no chest pain, no eye discharge, no headaches and no rash    HPI Comments: Jasmine Maynard is a 24 y.o. female who presents to the Emergency Department complaining of gradual onset, minimally productive cough that started a few weeks ago, but worsened this morning. She also reports having generalized myalgias and sore throat. As of this morning, patient notes that she has been having some difficulty with deep breaths. She did see her PCP earlier this week who put her on a stronger antacid.   Past Medical History  Diagnosis Date  . DDD (degenerative disc disease)   . Depression   . Herpes   . ADHD (attention deficit hyperactivity disorder)   . Perennial allergic rhinitis   . Vertigo   . Heartburn   . Pregnant 01/21/2015   Past Surgical History  Procedure Laterality Date  . Intrauterine device insertion  2011  . Myringotomy  2011    right  . Cholecystectomy  02/13/2012    Procedure: LAPAROSCOPIC CHOLECYSTECTOMY;  Surgeon: Fabio Bering, MD;  Location: [facility];  Service: General;  Laterality: N/A;   Family History  Problem Relation Age of Onset  . Diabetes Paternal Grandfather   . Heart attack Paternal Grandfather   . Other Paternal Grandfather     aneursym  . Diabetes Paternal Grandmother   . Cancer Paternal Grandmother     breast,lung  . Other Maternal Grandmother     heart issues;  has stent; has a eating disorder  . COPD Father   . Other Father     heart issues  . COPD Mother   . Mental illness Sister    Social History  Substance Use Topics  . Smoking status: Never Smoker   . Smokeless tobacco: Never Used  . Alcohol Use: No   OB History    Gravida Para Term Preterm AB TAB SAB Ectopic Multiple Living   Review of Systems  Constitutional: Negative for appetite change and fatigue.  HENT: Positive for sore throat. Negative for congestion, ear discharge and sinus pressure.   Eyes: Negative for discharge.  Respiratory: Positive for cough.   Cardiovascular: Negative for chest pain.  Gastrointestinal: Negative for abdominal pain and diarrhea.  Genitourinary: Negative for frequency and hematuria.  Musculoskeletal: Positive for myalgias. Negative for back pain.  Skin: Negative for rash.  Neurological: Negative for seizures and headaches.  Psychiatric/Behavioral: Negative for hallucinations.      Allergies  Amoxicillin; Avelox; Cefdinir; Doxycycline; and Latex  Home Medications   Prior to Admission medications   Medication Sig Start Date End Date Taking? Authorizing Provider  albuterol (PROVENTIL HFA;VENTOLIN HFA) 108 (90 BASE) MCG/ACT inhaler Inhale 2 puffs into the lungs every 6 (six) hours as needed for wheezing or shortness of breath. 07/01/15  Yes Jacklyn Shell,  CNM  dextromethorphan 15 MG/5ML syrup Take 10 mLs by mouth 4 (four) times daily as needed for cough.   Yes Historical Provider, MD  fexofenadine (ALLEGRA ALLERGY) 180 MG tablet Take 180 mg by mouth daily.   Yes Historical Provider, MD  omeprazole (PRILOSEC) 20 MG capsule Take 1 capsule (20 mg total) by mouth 2 (two) times daily before a meal. 02/09/15  Yes Montez Morita, CNM  prenatal vitamin w/FE, FA (PRENATAL 1 + 1) 27-1 MG TABS tablet Take 1 tablet by mouth daily at 12 noon. 01/21/15  Yes Adline Potter, NP   BP 123/62 mmHg  Pulse 105  Temp(Src) 98.3 F (36.8  C) (Oral)  Resp 20  Ht 5\' 4"  (1.626 m)  Wt 215 lb (97.523 kg)  BMI 36.89 kg/m2  SpO2 92%  LMP 12/15/2014 Physical Exam  Constitutional: She is oriented to person, place, and time. She appears well-developed.  HENT:  Head: Normocephalic.  Eyes: Conjunctivae and EOM are normal. No scleral icterus.  Neck: Neck supple. No thyromegaly present.  Cardiovascular: Normal rate and regular rhythm.  Exam reveals no gallop and no friction rub.   No murmur heard. Pulmonary/Chest: No stridor. She has wheezes. She has no rales. She exhibits no tenderness.  Mild wheezing bilaterally.  Abdominal: She exhibits no distension. There is no tenderness. There is no rebound.  Musculoskeletal: Normal range of motion. She exhibits no edema.  Lymphadenopathy:    She has no cervical adenopathy.  Neurological: She is oriented to person, place, and time. She exhibits normal muscle tone. Coordination normal.  Skin: No rash noted. No erythema.  Psychiatric: She has a normal mood and affect. Her behavior is normal.    ED Course  Procedures  DIAGNOSTIC STUDIES: Oxygen Saturation is 92% on room air, adequate by my interpretation.    COORDINATION OF CARE: 9:29 AM-Discussed treatment plan which includes breathing treatment with patient/guardian at bedside and patient/guardian agreed to plan.    Labs Review Labs Reviewed - No data to display  Imaging Review No results found. I personally reviewed and evaluated these images and lab results as part of my medical decision-making.   EKG Interpretation None      MDM   Final diagnoses:  None    Bronchitis, bronchospasm, iup,   Persistent cough,   Will tx with keflex and hycodan.  Pt has been put on hycodan by ob-gyn for persistent cough 4 months ago.    Pt to follow up with her ob-gyn   The chart was scribed for me under my direct supervision.  I personally performed the history, physical, and medical decision making and all procedures in the evaluation of  this patient.Jasmine Berkshire, MD 07/05/15 [phone]

## 2015-07-05 NOTE — ED Notes (Signed)
MD at bedside. 

## 2015-07-05 NOTE — Discharge Instructions (Signed)
Follow up with your md as scheduled °

## 2015-07-05 NOTE — ED Notes (Addendum)
Pt reports cough, generalized body aches, sore throat for last several weeks. Pt reports is [redacted] weeks pregnant. nad noted. Pt reports seen for same at pcp office. Pt reports given an inhaler that provides minimal relief.

## 2015-07-05 NOTE — ED Notes (Signed)
Resp paged for breathing treatment.  

## 2015-07-22 ENCOUNTER — Encounter: Payer: Self-pay | Admitting: Women's Health

## 2015-07-22 ENCOUNTER — Ambulatory Visit (INDEPENDENT_AMBULATORY_CARE_PROVIDER_SITE_OTHER): Payer: Medicaid Other | Admitting: Women's Health

## 2015-07-22 VITALS — BP 122/62 | HR 88 | Wt 218.0 lb

## 2015-07-22 DIAGNOSIS — Z331 Pregnant state, incidental: Secondary | ICD-10-CM

## 2015-07-22 DIAGNOSIS — Z1389 Encounter for screening for other disorder: Secondary | ICD-10-CM

## 2015-07-22 DIAGNOSIS — Z6791 Unspecified blood type, Rh negative: Secondary | ICD-10-CM

## 2015-07-22 DIAGNOSIS — Z3493 Encounter for supervision of normal pregnancy, unspecified, third trimester: Secondary | ICD-10-CM

## 2015-07-22 DIAGNOSIS — R768 Other specified abnormal immunological findings in serum: Secondary | ICD-10-CM

## 2015-07-22 DIAGNOSIS — O360931 Maternal care for other rhesus isoimmunization, third trimester, fetus 1: Secondary | ICD-10-CM | POA: Diagnosis not present

## 2015-07-22 LAB — POCT URINALYSIS DIPSTICK
Blood, UA: NEGATIVE
Glucose, UA: NEGATIVE
Ketones, UA: NEGATIVE
Leukocytes, UA: NEGATIVE
Nitrite, UA: NEGATIVE
Protein, UA: NEGATIVE

## 2015-07-22 MED ORDER — CYCLOBENZAPRINE HCL 10 MG PO TABS: 10.0000 mg | ORAL_TABLET | Freq: Three times a day (TID) | ORAL | Status: DC | PRN

## 2015-07-22 MED ORDER — RHO D IMMUNE GLOBULIN 1500 UNIT/2ML IJ SOSY
300.0000 ug | PREFILLED_SYRINGE | Freq: Once | INTRAMUSCULAR | Status: AC
  Administered 2015-07-22: 300 ug via INTRAMUSCULAR

## 2015-07-22 NOTE — Patient Instructions (Signed)
Call the office [phone]) or go to [facility] if:  You begin to have strong, frequent contractions  Your water breaks.  Sometimes it is a big gush of fluid, sometimes it is just a trickle that keeps getting your panties wet or running down your legs  You have vaginal bleeding.  It is normal to have a small amount of spotting if your cervix was checked.   You don't feel your baby moving like normal.  If you don't, get you something to eat and drink and lay down and focus on feeling your baby move.  You should feel at least 10 movements in 2 hours.  If you don't, you should call the office or go to [facility].    Tdap Vaccine  It is recommended that you get the Tdap vaccine during the third trimester of EACH pregnancy to help protect your baby from getting pertussis (whooping cough)  27-36 weeks is the BEST time to do this so that you can pass the protection on to your baby. During pregnancy is better than after pregnancy, but if you are unable to get it during pregnancy it will be offered at the hospital.   You can get this vaccine at the health department or your family doctor  Everyone who will be around your baby should also be up-to-date on their vaccines. Adults (who are not pregnant) only need 1 dose of Tdap during adulthood.    Thinking About Doren Custard???  Why consider waterbirth? . Gentle birth for babies . Less pain medicine used in labor . May allow for passive descent/less pushing . May reduce perineal tears  . More mobility and instinctive maternal position changes . Increased maternal relaxation . Reduced blood pressure in labor  Is waterbirth safe? What are the risks of infection, drowning or other complications? . Infection o Very low risk (3.7 % for tub vs 4.8% for bed) o 7 in 8000 waterbirths with documented infection o Poorly cleaned equipment most common cause o Slightly lower group B strep transmission rate  . Drowning o Maternal:  - Very low  risk   - Related to seizures or fainting o Newborn:  - Very low risk. No evidence of increased risk of respiratory problems in multiple large studies - Physiological protection from breathing under water - Avoid underwater birth if there are any fetal complications - Once baby's head is out of the water, keep it out.  . Birth complication o Some reports of cord trauma, but risk decreased by bringing baby to surface gradually o No evidence of increased risk of shoulder dystocia. Mothers can usually change positions faster in water than in a bed, possibly aiding the maneuvers to free the shoulder.  You must attend a Doren Custard class at [facility]  3rd Wednesday of every month from 7-9pm  Free  [employer] by calling [phone] or online at [employer]  Bring [place] the certificate from the class  Waterbirth supplies needed for [facility] patients:  Our practice has a [employer] manager in a Box tub at the hospital that you can borrow  You will need to purchase an accessory kit that has all needed supplies through [employer] (  ) or online  Or you can purchase the supplies separately: o Single-use disposable tub liner for [employer] in a Box (REGULAR size) o New garden hose labeled "lead-free", "suitable for drinking water", "non-toxic" OR "water potable" o Garden hose to remove the dirty water o Electric drain pump to remove water (  We recommend 792 gallon per hour or greater pump.)  o Fish net o Bathing suit top (optional) o Long-handled mirror (optional)  [employer] sells tubs for ~ $120 if you would rather purchase your own tub  [employer] (www.thelaborladies.com) $275 for tub rental/set-up & take down/kit   Things that would prevent you from having a waterbirth:  Premature, <37wks  Previous cesarean birth  Presence of thick meconium-stained fluid  Multiple gestation (Twins, triplets, etc.)  Uncontrolled  diabetes  Hypertension  Heavy vaginal bleeding  Non-reassuring fetal heart rate  Active infection (MRSA, etc.)  If your labor has to be induced  Other risk issues identified by your obstetrical provider   Third Trimester of Pregnancy The third trimester is from week 29 through week 42, months 7 through 9. The third trimester is a time when the fetus is growing rapidly. At the end of the ninth month, the fetus is about 20 inches in length and weighs 6-10 pounds.  BODY CHANGES Your body goes through many changes during pregnancy. The changes vary from woman to woman.   Your weight will continue to increase. You can expect to gain 25-35 pounds (11-16 kg) by the end of the pregnancy.  You may begin to get stretch marks on your hips, abdomen, and breasts.  You may urinate more often because the fetus is moving lower into your pelvis and pressing on your bladder.  You may develop or continue to have heartburn as a result of your pregnancy.  You may develop constipation because certain hormones are causing the muscles that push waste through your intestines to slow down.  You may develop hemorrhoids or swollen, bulging veins (varicose veins).  You may have pelvic pain because of the weight gain and pregnancy hormones relaxing your joints between the bones in your pelvis. Backaches may result from overexertion of the muscles supporting your posture.  You may have changes in your hair. These can include thickening of your hair, rapid growth, and changes in texture. Some women also have hair loss during or after pregnancy, or hair that feels dry or thin. Your hair will most likely return to normal after your baby is born.  Your breasts will continue to grow and be tender. A yellow discharge may leak from your breasts called colostrum.  Your belly button may stick out.  You may feel short of breath because of your expanding uterus.  You may notice the fetus "dropping," or moving lower  in your abdomen.  You may have a bloody mucus discharge. This usually occurs a few days to a week before labor begins.  Your cervix becomes thin and soft (effaced) near your due date. WHAT TO EXPECT AT YOUR PRENATAL EXAMS  You will have prenatal exams every 2 weeks until week 36. Then, you will have weekly prenatal exams. During a routine prenatal visit:  You will be weighed to make sure you and the fetus are growing normally.  Your blood pressure is taken.  Your abdomen will be measured to track your baby's growth.  The fetal heartbeat will be listened to.  Any test results from the previous visit will be discussed.  You may have a cervical check near your due date to see if you have effaced. At around 36 weeks, your caregiver will check your cervix. At the same time, your caregiver will also perform a test on the secretions of the vaginal tissue. This test is to determine if a type of bacteria, Group B streptococcus, is  present. Your caregiver will explain this further. Your caregiver may ask you:  What your birth plan is.  How you are feeling.  If you are feeling the baby move.  If you have had any abnormal symptoms, such as leaking fluid, bleeding, severe headaches, or abdominal cramping.  If you have any questions. Other tests or screenings that may be performed during your third trimester include:  Blood tests that check for low iron levels (anemia).  Fetal testing to check the health, activity level, and growth of the fetus. Testing is done if you have certain medical conditions or if there are problems during the pregnancy. FALSE LABOR You may feel small, irregular contractions that eventually go away. These are called Braxton Hicks contractions, or false labor. Contractions may last for hours, days, or even weeks before true labor sets in. If contractions come at regular intervals, intensify, or become painful, it is best to be seen by your caregiver.  SIGNS OF LABOR    Menstrual-like cramps.  Contractions that are 5 minutes apart or less.  Contractions that start on the top of the uterus and spread down to the lower abdomen and back.  A sense of increased pelvic pressure or back pain.  A watery or bloody mucus discharge that comes from the vagina. If you have any of these signs before the 37th week of pregnancy, call your caregiver right away. You need to go to the hospital to get checked immediately. HOME CARE INSTRUCTIONS   Avoid all smoking, herbs, alcohol, and unprescribed drugs. These chemicals affect the formation and growth of the baby.  Follow your caregiver's instructions regarding medicine use. There are medicines that are either safe or unsafe to take during pregnancy.  Exercise only as directed by your caregiver. Experiencing uterine cramps is a good sign to stop exercising.  Continue to eat regular, healthy meals.  Wear a good support bra for breast tenderness.  Do not use hot tubs, steam rooms, or saunas.  Wear your seat belt at all times when driving.  Avoid raw meat, uncooked cheese, cat litter boxes, and soil used by cats. These carry germs that can cause birth defects in the baby.  Take your prenatal vitamins.  Try taking a stool softener (if your caregiver approves) if you develop constipation. Eat more high-fiber foods, such as fresh vegetables or fruit and whole grains. Drink plenty of fluids to keep your urine clear or pale yellow.  Take warm sitz baths to soothe any pain or discomfort caused by hemorrhoids. Use hemorrhoid cream if your caregiver approves.  If you develop varicose veins, wear support hose. Elevate your feet for 15 minutes, 3-4 times a day. Limit salt in your diet.  Avoid heavy lifting, wear low heal shoes, and practice good posture.  Rest a lot with your legs elevated if you have leg cramps or low back pain.  Visit your dentist if you have not gone during your pregnancy. Use a soft toothbrush to  brush your teeth and be gentle when you floss.  A sexual relationship may be continued unless your caregiver directs you otherwise.  Do not travel far distances unless it is absolutely necessary and only with the approval of your caregiver.  Take prenatal classes to understand, practice, and ask questions about the labor and delivery.  Make a trial run to the hospital.  Pack your hospital bag.  Prepare the baby's nursery.  Continue to go to all your prenatal visits as directed by your caregiver. [place]  CARE IF:  You are unsure if you are in labor or if your water has broken.  You have dizziness.  You have mild pelvic cramps, pelvic pressure, or nagging pain in your abdominal area.  You have persistent nausea, vomiting, or diarrhea.  You have a bad smelling vaginal discharge.  You have pain with urination. SEEK IMMEDIATE MEDICAL CARE IF:   You have a fever.  You are leaking fluid from your vagina.  You have spotting or bleeding from your vagina.  You have severe abdominal cramping or pain.  You have rapid weight loss or gain.  You have shortness of breath with chest pain.  You notice sudden or extreme swelling of your face, hands, ankles, feet, or legs.  You have not felt your baby move in over an hour.  You have severe headaches that do not go away with medicine.  You have vision changes. Document Released: 11/01/2001 Document Revised: 11/12/2013 Document Reviewed: 01/08/2013 [facility] Patient Information 2015 [place], [place]. This information is not intended to replace advice given to you by your health care provider. Make sure you discuss any questions you have with your health care provider.

## 2015-07-22 NOTE — Progress Notes (Signed)
Low-risk OB appointment G2P1001 [redacted]w[redacted]d Estimated Date of Delivery: 09/24/15 BP 122/62 mmHg  Pulse 88  Wt 218 lb (98.884 kg)  LMP 12/15/2014  BP, weight, and urine reviewed.  Refer to obstetrical flow sheet for FH & FHR.  Reports good fm.  Denies regular uc's, lof, vb, or uti s/s. Pain RUQ same as last visit, worse w/ coughing- thinks it may be musculoskeletal. Slightly tender to palpation. Will try flexeril.  Reviewed pn2 results, ptl s/s, fkc. Recommended Tdap at HD/PCP per CDC guidelines.  Plan:  Continue routine obstetrical care  F/U in 2wks for OB appointment

## 2015-07-26 ENCOUNTER — Inpatient Hospital Stay (HOSPITAL_COMMUNITY)
Admission: AD | Admit: 2015-07-26 | Discharge: 2015-07-26 | Disposition: A | Discharge status: Home / Self Care | Payer: Medicaid Other | Source: Ambulatory Visit | Attending: Obstetrics and Gynecology | Admitting: Obstetrics and Gynecology

## 2015-07-26 ENCOUNTER — Encounter (HOSPITAL_COMMUNITY): Payer: Self-pay | Admitting: *Deleted

## 2015-07-26 DIAGNOSIS — O26893 Other specified pregnancy related conditions, third trimester: Secondary | ICD-10-CM | POA: Insufficient documentation

## 2015-07-26 DIAGNOSIS — O9989 Other specified diseases and conditions complicating pregnancy, childbirth and the puerperium: Secondary | ICD-10-CM

## 2015-07-26 DIAGNOSIS — R1011 Right upper quadrant pain: Secondary | ICD-10-CM | POA: Diagnosis not present

## 2015-07-26 DIAGNOSIS — Z3A31 31 weeks gestation of pregnancy: Secondary | ICD-10-CM | POA: Diagnosis not present

## 2015-07-26 LAB — URINALYSIS, ROUTINE W REFLEX MICROSCOPIC
Bilirubin Urine: NEGATIVE
GLUCOSE, UA: NEGATIVE mg/dL
HGB URINE DIPSTICK: NEGATIVE
Ketones, ur: NEGATIVE mg/dL
Leukocytes, UA: NEGATIVE
Nitrite: NEGATIVE
PH: 6.5 (ref 5.0–8.0)
Protein, ur: NEGATIVE mg/dL
SPECIFIC GRAVITY, URINE: 1.01 (ref 1.005–1.030)
Urobilinogen, UA: 0.2 mg/dL (ref 0.0–1.0)

## 2015-07-26 NOTE — MAU Note (Addendum)
Pt states here for epigastric pain that is constant. Takes omeprazole daily. Had gallbladder removed and provider said pt may have to get clamps from gb surgery checked out. Has had intermittent cramping. No abnormal vag d/c or bleeding

## 2015-07-26 NOTE — MAU Provider Note (Signed)
History   24 yo G2P1001 @ 31.3 wks in with c/o RUQ pain since yesterday. Sharp in nature. surg  hx pos for lap GB. Denies nausea, vomiting or diarrhea.  Denies any other complaint.  CSN: [id number]  Arrival date and time: 07/26/15 1110   First Provider Initiated Contact with Patient 07/26/15 1235      No chief complaint on file.  HPI  OB History    Gravida Para Term Preterm AB TAB SAB Ectopic Multiple Living   Past Medical History  Diagnosis Date  . DDD (degenerative disc disease)   . Depression   . Herpes   . ADHD (attention deficit hyperactivity disorder)   . Perennial allergic rhinitis   . Vertigo   . Heartburn   . Pregnant 01/21/2015    Past Surgical History  Procedure Laterality Date  . Intrauterine device insertion  2011  . Myringotomy  2011    right  . Cholecystectomy  02/13/2012    Procedure: LAPAROSCOPIC CHOLECYSTECTOMY;  Surgeon: Fabio Bering, MD;  Location: [facility];  Service: General;  Laterality: N/A;    Family History  Problem Relation Age of Onset  . Diabetes Paternal Grandfather   . Heart attack Paternal Grandfather   . Other Paternal Grandfather     aneursym  . Diabetes Paternal Grandmother   . Cancer Paternal Grandmother     breast,lung  . Other Maternal Grandmother     heart issues; has stent; has a eating disorder  . COPD Father   . Other Father     heart issues  . COPD Mother   . Mental illness Sister     Social History  Substance Use Topics  . Smoking status: Never Smoker   . Smokeless tobacco: Never Used  . Alcohol Use: No    Allergies:  Allergies  Allergen Reactions  . Amoxicillin     Reaction: INEFFECTIVE  . Avelox [Moxifloxacin Hcl In Nacl] Diarrhea and Nausea And Vomiting  . Cefdinir     ineffective  . Doxycycline Nausea And Vomiting  . Latex Rash    Prescriptions prior to admission  Medication Sig Dispense Refill Last Dose  . cetirizine (ZYRTEC) 10 MG tablet Take 10 mg by mouth daily.    07/26/2015 at Unknown time  . dextromethorphan 15 MG/5ML syrup Take 10 mLs by mouth 4 (four) times daily as needed for cough.   Past Week at Unknown time  . omeprazole (PRILOSEC) 20 MG capsule Take 1 capsule (20 mg total) by mouth 2 (two) times daily before a meal. 60 capsule 11 07/25/2015 at Unknown time  . Pediatric Multiple Vit-C-FA (FLINSTONES GUMMIES OMEGA-3 DHA PO) Take 1 tablet by mouth daily.     at Unknown time  . albuterol (PROVENTIL HFA;VENTOLIN HFA) 108 (90 BASE) MCG/ACT inhaler Inhale 2 puffs into the lungs every 6 (six) hours as needed for wheezing or shortness of breath. 1 Inhaler 2 prn  . cephALEXin (KEFLEX) 500 MG capsule Take 1 capsule (500 mg total) by mouth 4 (four) times daily. (Patient not taking: Reported on 07/22/2015) 28 capsule 0 Completed Course at Unknown time  . cyclobenzaprine (FLEXERIL) 10 MG tablet Take 1 tablet (10 mg total) by mouth every 8 (eight) hours as needed for muscle spasms. (Patient not taking: Reported on 07/26/2015) 30 tablet 1 Not Taking at Unknown time  . HYDROcodone-homatropine (HYCODAN) 5-1.5 MG/5ML syrup Take 5 mLs by mouth every 6 (six)  hours as needed for cough. (Patient not taking: Reported on 07/22/2015) 60 mL 0 Not Taking at Unknown time  . prenatal vitamin w/FE, FA (PRENATAL 1 + 1) 27-1 MG TABS tablet Take 1 tablet by mouth daily at 12 noon. (Patient not taking: Reported on 07/22/2015) 30 each 11 Not Taking at Unknown time    Review of Systems  Constitutional: Negative.   HENT: Negative.   Eyes: Negative.   Respiratory: Negative.   Cardiovascular: Negative.   Gastrointestinal: Positive for abdominal pain.  Genitourinary: Negative.   Musculoskeletal: Negative.   Skin: Negative.   Neurological: Negative.   Endo/Heme/Allergies: Negative.   Psychiatric/Behavioral: Negative.    Physical Exam   Blood pressure 146/74, pulse 91, temperature 98.3 F (36.8 C), temperature source Oral, resp. rate 19, last menstrual period 12/15/2014.  Physical Exam   Constitutional: She is oriented to person, place, and time. She appears well-developed and well-nourished.  HENT:  Head: Normocephalic.  Neck: Normal range of motion. Neck supple.  Cardiovascular: Normal rate, regular rhythm and normal heart sounds.   Respiratory: Effort normal and breath sounds normal. No respiratory distress.  GI: Soft. There is no tenderness.  Genitourinary: Vagina normal. No bleeding in the vagina. No vaginal discharge (mucusy) found.  Gravid uterus  Musculoskeletal: Normal range of motion. She exhibits no edema.  Neurological: She is alert and oriented to person, place, and time.  Skin: Skin is warm and dry.  Psychiatric: She has a normal mood and affect. Her behavior is normal. Judgment and thought content normal.    MAU Course  Procedures  MDM Common discomfort of pregnancy  Assessment and Plan  abd pain in preg iup at 31.3 stable maternal fetal unit Increase po fluids F/u as needed at [facility] DARLENE 07/26/2015, 12:38 PM

## 2015-07-26 NOTE — Discharge Instructions (Signed)
abd pain in pregAbdominal Pain During Pregnancy Abdominal pain is common in pregnancy. Most of the time, it does not cause harm. There are many causes of abdominal pain. Some causes are more serious than others. Some of the causes of abdominal pain in pregnancy are easily diagnosed. Occasionally, the diagnosis takes time to understand. Other times, the cause is not determined. Abdominal pain can be a sign that something is very wrong with the pregnancy, or the pain may have nothing to do with the pregnancy at all. For this reason, always tell your health care provider if you have any abdominal discomfort. HOME CARE INSTRUCTIONS  Monitor your abdominal pain for any changes. The following actions may help to alleviate any discomfort you are experiencing:  Do not have sexual intercourse or put anything in your vagina until your symptoms go away completely.  Get plenty of rest until your pain improves.  Drink clear fluids if you feel nauseous. Avoid solid food as long as you are uncomfortable or nauseous.  Only take over-the-counter or prescription medicine as directed by your health care provider.  Keep all follow-up appointments with your health care provider. SEEK IMMEDIATE MEDICAL CARE IF:  You are bleeding, leaking fluid, or passing tissue from the vagina.  You have increasing pain or cramping.  You have persistent vomiting.  You have painful or bloody urination.  You have a fever.  You notice a decrease in your baby's movements.  You have extreme weakness or feel faint.  You have shortness of breath, with or without abdominal pain.  You develop a severe headache with abdominal pain.  You have abnormal vaginal discharge with abdominal pain.  You have persistent diarrhea.  You have abdominal pain that continues even after rest, or gets worse. MAKE SURE YOU:   Understand these instructions.  Will watch your condition.  Will get help right away if you are not doing well or  get worse. Document Released: 11/07/2005 Document Revised: 08/28/2013 Document Reviewed: 06/06/2013 [facility] Patient Information 2015 [place], [place]. This information is not intended to replace advice given to you by your health care provider. Make sure you discuss any questions you have with your health care provider.

## 2015-08-04 ENCOUNTER — Inpatient Hospital Stay (HOSPITAL_COMMUNITY)
Admission: AD | Admit: 2015-08-04 | Discharge: 2015-08-04 | Disposition: A | Discharge status: Home / Self Care | Payer: Medicaid Other | Source: Ambulatory Visit | Attending: Obstetrics & Gynecology | Admitting: Obstetrics & Gynecology

## 2015-08-04 ENCOUNTER — Encounter (HOSPITAL_COMMUNITY): Payer: Self-pay | Admitting: *Deleted

## 2015-08-04 DIAGNOSIS — R109 Unspecified abdominal pain: Secondary | ICD-10-CM | POA: Insufficient documentation

## 2015-08-04 DIAGNOSIS — O9989 Other specified diseases and conditions complicating pregnancy, childbirth and the puerperium: Secondary | ICD-10-CM

## 2015-08-04 DIAGNOSIS — G8929 Other chronic pain: Secondary | ICD-10-CM | POA: Diagnosis not present

## 2015-08-04 DIAGNOSIS — R110 Nausea: Secondary | ICD-10-CM | POA: Diagnosis present

## 2015-08-04 DIAGNOSIS — Z3A32 32 weeks gestation of pregnancy: Secondary | ICD-10-CM | POA: Insufficient documentation

## 2015-08-04 DIAGNOSIS — O26893 Other specified pregnancy related conditions, third trimester: Secondary | ICD-10-CM | POA: Diagnosis not present

## 2015-08-04 DIAGNOSIS — R1010 Upper abdominal pain, unspecified: Secondary | ICD-10-CM

## 2015-08-04 DIAGNOSIS — R1011 Right upper quadrant pain: Secondary | ICD-10-CM

## 2015-08-04 DIAGNOSIS — Z3493 Encounter for supervision of normal pregnancy, unspecified, third trimester: Secondary | ICD-10-CM

## 2015-08-04 DIAGNOSIS — R112 Nausea with vomiting, unspecified: Secondary | ICD-10-CM | POA: Insufficient documentation

## 2015-08-04 DIAGNOSIS — O219 Vomiting of pregnancy, unspecified: Secondary | ICD-10-CM

## 2015-08-04 LAB — CBC WITH DIFFERENTIAL/PLATELET
BLASTS: 0 %
Band Neutrophils: 0 % (ref 0–10)
Basophils Absolute: 0 10*3/uL (ref 0.0–0.1)
Basophils Relative: 0 % (ref 0–1)
Eosinophils Absolute: 0.2 10*3/uL (ref 0.0–0.7)
Eosinophils Relative: 2 % (ref 0–5)
HEMATOCRIT: 34.5 % — AB (ref 36.0–46.0)
HEMOGLOBIN: 11.8 g/dL — AB (ref 12.0–15.0)
LYMPHS PCT: 20 % (ref 12–46)
Lymphs Abs: 2.2 10*3/uL (ref 0.7–4.0)
MCH: 29.2 pg (ref 26.0–34.0)
MCHC: 34.2 g/dL (ref 30.0–36.0)
MCV: 85.4 fL (ref 78.0–100.0)
MYELOCYTES: 0 %
Metamyelocytes Relative: 0 %
Monocytes Absolute: 0.9 10*3/uL (ref 0.1–1.0)
Monocytes Relative: 8 % (ref 3–12)
NEUTROS PCT: 70 % (ref 43–77)
NRBC: 0 /100{WBCs}
Neutro Abs: 7.5 10*3/uL (ref 1.7–7.7)
OTHER: 0 %
PROMYELOCYTES ABS: 0 %
Platelets: 214 10*3/uL (ref 150–400)
RBC: 4.04 MIL/uL (ref 3.87–5.11)
RDW: 13.5 % (ref 11.5–15.5)
WBC: 10.8 10*3/uL — AB (ref 4.0–10.5)

## 2015-08-04 LAB — URINALYSIS, ROUTINE W REFLEX MICROSCOPIC
Bilirubin Urine: NEGATIVE
Glucose, UA: NEGATIVE mg/dL
Hgb urine dipstick: NEGATIVE
KETONES UR: NEGATIVE mg/dL
LEUKOCYTES UA: NEGATIVE
NITRITE: NEGATIVE
PH: 8 (ref 5.0–8.0)
PROTEIN: NEGATIVE mg/dL
Specific Gravity, Urine: 1.015 (ref 1.005–1.030)
UROBILINOGEN UA: 0.2 mg/dL (ref 0.0–1.0)

## 2015-08-04 LAB — COMPREHENSIVE METABOLIC PANEL
ALK PHOS: 70 U/L (ref 38–126)
ALT: 11 U/L — ABNORMAL LOW (ref 14–54)
ANION GAP: 8 (ref 5–15)
AST: 13 U/L — ABNORMAL LOW (ref 15–41)
Albumin: 2.9 g/dL — ABNORMAL LOW (ref 3.5–5.0)
BILIRUBIN TOTAL: 0.4 mg/dL (ref 0.3–1.2)
BUN: 7 mg/dL (ref 6–20)
CALCIUM: 8.6 mg/dL — AB (ref 8.9–10.3)
CO2: 21 mmol/L — ABNORMAL LOW (ref 22–32)
Chloride: 104 mmol/L (ref 101–111)
Creatinine, Ser: 0.55 mg/dL (ref 0.44–1.00)
GFR calc Af Amer: >60 mL/min (ref >60)
Glucose, Bld: 85 mg/dL (ref 65–99)
POTASSIUM: 3.8 mmol/L (ref 3.5–5.1)
Sodium: 133 mmol/L — ABNORMAL LOW (ref 135–145)
TOTAL PROTEIN: 6.4 g/dL — AB (ref 6.5–8.1)

## 2015-08-04 LAB — LIPASE, BLOOD: LIPASE: 23 U/L (ref 22–51)

## 2015-08-04 MED ORDER — LACTATED RINGERS IV BOLUS (SEPSIS)
1000.0000 mL | Freq: Once | INTRAVENOUS | Status: AC
  Administered 2015-08-04: 1000 mL via INTRAVENOUS

## 2015-08-04 MED ORDER — ONDANSETRON HCL 4 MG PO TABS: 4.0000 mg | ORAL_TABLET | Freq: Three times a day (TID) | ORAL | Status: DC | PRN

## 2015-08-04 MED ORDER — ONDANSETRON 4 MG PO TBDP
4.0000 mg | ORAL_TABLET | Freq: Once | ORAL | Status: AC
  Administered 2015-08-04: 4 mg via ORAL
  Filled 2015-08-04: qty 1

## 2015-08-04 NOTE — Discharge Instructions (Signed)

## 2015-08-04 NOTE — MAU Note (Addendum)
C/o Nausea since this morning and vomiting since 1600 this afternoon; states that she does not have a gallbladder; states that she was vomiting up coffee grounds;

## 2015-08-04 NOTE — MAU Note (Signed)
Pt states that she has n/v today. Has had gallbladder removed but was told she has a bile duct that has sludge. Was told that if her vomit looked like coffee grounds that she needed to come in. The last two times has not looked like that but before that she noticed it looked like coffee grounds. Has some upper abdominal pain-which is normal. Takes omenprazole BID. Does not have nausea medications. Denies vag bleeding, but some mild cramping-which is also normal for her. +FM

## 2015-08-04 NOTE — MAU Provider Note (Signed)
History     CSN: [id number]  Arrival date and time: 08/04/15 5621   First Provider Initiated Contact with Patient 08/04/15 2121      Chief Complaint  Patient presents with  . Emesis  . Nausea   HPI Patient has been feeling nauseated all day. Ate a hamburger and salad at about 3:30pm and started vomiting at 4pm. Has been having sharp epigastric/RUQ pain radiating to back on and off for the past few months but has not had any imaging. She did note some dark/black small chunks and when called triage line was told to come to be evaluated for coffee ground emesis. Did have emesis to be visualized which appeared to be beef, not coffee ground emesis. Has had a cholecystectomy for cholecystitis and biliary dyskinesia without stones per patient. She denies any sick contacts. No diarrhea. Does note stools have been darker recently. Not a smoker, drinker, or drinker of large amounts of coffee. She does have stress but doesn't note a relationship to abdominal pain. She denies fever and chills. +FM, no LOF, no VB, no vaginal discharge. She states that abdominal pain is worse after eating at times. Does take omeprazole for GERD.    Past Medical History  Diagnosis Date  . DDD (degenerative disc disease)   . Depression   . Herpes   . ADHD (attention deficit hyperactivity disorder)   . Perennial allergic rhinitis   . Vertigo   . Heartburn   . Pregnant 01/21/2015    Past Surgical History  Procedure Laterality Date  . Intrauterine device insertion  2011  . Myringotomy  2011    right  . Cholecystectomy  02/13/2012    Procedure: LAPAROSCOPIC CHOLECYSTECTOMY;  Surgeon: Fabio Bering, MD;  Location: [facility];  Service: General;  Laterality: N/A;    Family History  Problem Relation Age of Onset  . Diabetes Paternal Grandfather   . Heart attack Paternal Grandfather   . Other Paternal Grandfather     aneursym  . Diabetes Paternal Grandmother   . Cancer Paternal Grandmother     breast,lung  .  Other Maternal Grandmother     heart issues; has stent; has a eating disorder  . COPD Father   . Other Father     heart issues  . COPD Mother   . Mental illness Sister     Social History  Substance Use Topics  . Smoking status: Never Smoker   . Smokeless tobacco: Never Used  . Alcohol Use: No    Allergies:  Allergies  Allergen Reactions  . Amoxicillin     Reaction: INEFFECTIVE  . Avelox [Moxifloxacin Hcl In Nacl] Diarrhea and Nausea And Vomiting  . Cefdinir     ineffective  . Doxycycline Nausea And Vomiting  . Latex Rash    Prescriptions prior to admission  Medication Sig Dispense Refill Last Dose  . albuterol (PROVENTIL HFA;VENTOLIN HFA) 108 (90 BASE) MCG/ACT inhaler Inhale 2 puffs into the lungs every 6 (six) hours as needed for wheezing or shortness of breath. 1 Inhaler 2 Past Month at Unknown time  . cetirizine (ZYRTEC) 10 MG tablet Take 10 mg by mouth daily.   Past Month at Unknown time  . diphenhydrAMINE (SOMINEX) 25 MG tablet Take 25 mg by mouth at bedtime as needed for allergies.   Past Week at Unknown time  . Fexofenadine HCl (ALLEGRA ALLERGY PO) Take 1 tablet by mouth daily.   08/03/2015 at Unknown time  . omeprazole (PRILOSEC) 20 MG capsule Take 1  capsule (20 mg total) by mouth 2 (two) times daily before a meal. 60 capsule 11 08/03/2015 at Unknown time  . prenatal vitamin w/FE, FA (PRENATAL 1 + 1) 27-1 MG TABS tablet Take 1 tablet by mouth daily at 12 noon. (Patient taking differently: Take 2 tablets by mouth daily at 12 noon. ) 30 each 11 08/03/2015 at Unknown time    Review of Systems  Constitutional: Negative for fever, chills and malaise/fatigue.  Eyes: Negative.   Respiratory: Negative for cough and hemoptysis.   Cardiovascular: Negative.   Gastrointestinal: Positive for nausea, vomiting and abdominal pain. Negative for heartburn, diarrhea, constipation and blood in stool.  Genitourinary: Negative for dysuria, urgency, frequency, hematuria and flank pain.   Musculoskeletal: Negative.   Skin: Negative.   Neurological: Negative.  Negative for weakness.  Endo/Heme/Allergies: Negative.   Psychiatric/Behavioral: Negative.    Physical Exam   Blood pressure 114/79, pulse 94, temperature 98.7 F (37.1 C), temperature source Oral, resp. rate 20, height 5' 4.5" (1.638 m), weight 220 lb 3.2 oz (99.882 kg), last menstrual period 12/15/2014, SpO2 98 %.  Physical Exam  Constitutional: She is oriented to person, place, and time. She appears well-developed and well-nourished.  HENT:  Head: Normocephalic and atraumatic.  Eyes: Conjunctivae and EOM are normal. Pupils are equal, round, and reactive to light.  Cardiovascular: Normal rate, regular rhythm, normal heart sounds and intact distal pulses.  Exam reveals no gallop and no friction rub.   No murmur heard. Respiratory: Effort normal and breath sounds normal. No respiratory distress. She has no wheezes. She has no rales.  GI: Soft. Bowel sounds are normal. There is tenderness. There is no rebound and no guarding.  Neurological: She is alert and oriented to person, place, and time.  Skin: Skin is warm and dry.  Psychiatric: She has a normal mood and affect. Her behavior is normal. Judgment and thought content normal.  EFM: FHR: 145, moderate variability, +accels 10x10, -decels, no contractions noted  MAU Course  Procedures  MDM Evaluated for occult bleeding and infection with CBC which showed no leukocytosis, no acute anemia Checked CMP and lipase for liver and pancreas pathology which showed mild hyponatremia and low bicarb c/w vomiting; no transaminitis or elevated lipase levels Considered stool guiac but not available at this facility, will need outpt f/u if concern for occult GI bleed UA negative for UTI, less concerned for pyelonephritis EFM: category 1 tracing, no contractions Assessment and Plan  24yo G2P1 @ 32+5 here for abdominal pain, nausea, and emesis -No evidence of coffee ground  emesis -Differential includes viral GE vs PUD vs pancreatitis; most likely viral GE -Labs unremarkable -Gave 1L LR and zofran for nausea, emesis, and dehydration with improvement in symptoms -Will send home with zofran -Recommend outpatient f/u with GI given persistence of symptoms -Continue omeprazole Follow-up Information    Follow up with [facility].   Specialty:  Obstetrics and Gynecology   Why:  As needed, If symptoms worsen, and as regularly scheduled   Contact information:   [address] [place] [address] [phone]      Schedule an appointment as soon as possible for a visit with [facility].   Why:  For persistent RUQ pain       Durenda Hurt 08/04/2015, 9:25 PM   I was consulted RE: POC, reviewed labs and NST and agree with above. Pt left prior to CNM re-eval.   Dorathy Kinsman, CNM 08/05/2015 2:31 AM

## 2015-08-05 ENCOUNTER — Ambulatory Visit (INDEPENDENT_AMBULATORY_CARE_PROVIDER_SITE_OTHER): Payer: Medicaid Other | Admitting: Women's Health

## 2015-08-05 ENCOUNTER — Encounter: Payer: Self-pay | Admitting: Women's Health

## 2015-08-05 VITALS — BP 120/62 | HR 72 | Wt 220.0 lb

## 2015-08-05 DIAGNOSIS — Z1389 Encounter for screening for other disorder: Secondary | ICD-10-CM

## 2015-08-05 DIAGNOSIS — O360131 Maternal care for anti-D [Rh] antibodies, third trimester, fetus 1: Secondary | ICD-10-CM

## 2015-08-05 DIAGNOSIS — Z3493 Encounter for supervision of normal pregnancy, unspecified, third trimester: Secondary | ICD-10-CM

## 2015-08-05 DIAGNOSIS — Z331 Pregnant state, incidental: Secondary | ICD-10-CM

## 2015-08-05 DIAGNOSIS — R768 Other specified abnormal immunological findings in serum: Secondary | ICD-10-CM

## 2015-08-05 DIAGNOSIS — R1011 Right upper quadrant pain: Secondary | ICD-10-CM

## 2015-08-05 LAB — POCT URINALYSIS DIPSTICK
GLUCOSE UA: NEGATIVE
Ketones, UA: NEGATIVE
Leukocytes, UA: NEGATIVE
NITRITE UA: NEGATIVE
Protein, UA: NEGATIVE
RBC UA: NEGATIVE

## 2015-08-05 MED ORDER — ACYCLOVIR 400 MG PO TABS: 400.0000 mg | ORAL_TABLET | Freq: Three times a day (TID) | ORAL | Status: DC

## 2015-08-05 NOTE — Patient Instructions (Signed)
Begin acyclovir 08/13/15 when you turn 25weeks  Call the office [phone]) or go to [facility] if:  You begin to have strong, frequent contractions  Your water breaks.  Sometimes it is a big gush of fluid, sometimes it is just a trickle that keeps getting your panties wet or running down your legs  You have vaginal bleeding.  It is normal to have a small amount of spotting if your cervix was checked.   You don't feel your baby moving like normal.  If you don't, get you something to eat and drink and lay down and focus on feeling your baby move.  You should feel at least 10 movements in 2 hours.  If you don't, you should call the office or go to [facility]    Preterm Labor Information Preterm labor is when labor starts at less than 37 weeks of pregnancy. The normal length of a pregnancy is 39 to 41 weeks. CAUSES Often, there is no identifiable underlying cause as to why a woman goes into preterm labor. One of the most common known causes of preterm labor is infection. Infections of the uterus, cervix, vagina, amniotic sac, bladder, kidney, or even the lungs (pneumonia) can cause labor to start. Other suspected causes of preterm labor include:   Urogenital infections, such as yeast infections and bacterial vaginosis.   Uterine abnormalities (uterine shape, uterine septum, fibroids, or bleeding from the placenta).   A cervix that has been operated on (it may fail to stay closed).   Malformations in the fetus.   Multiple gestations (twins, triplets, and so on).   Breakage of the amniotic sac.  RISK FACTORS  Having a previous history of preterm labor.   Having premature rupture of membranes (PROM).   Having a placenta that covers the opening of the cervix (placenta previa).   Having a placenta that separates from the uterus (placental abruption).   Having a cervix that is too weak to hold the fetus in the uterus (incompetent cervix).   Having too much fluid  in the amniotic sac (polyhydramnios).   Taking illegal drugs or smoking while pregnant.   Not gaining enough weight while pregnant.   Being younger than 78 and older than 25 years old.   Having a low socioeconomic status.   Being African American. SYMPTOMS Signs and symptoms of preterm labor include:   Menstrual-like cramps, abdominal pain, or back pain.  Uterine contractions that are regular, as frequent as six in an hour, regardless of their intensity (may be mild or painful).  Contractions that start on the top of the uterus and spread down to the lower abdomen and back.   A sense of increased pelvic pressure.   A watery or bloody mucus discharge that comes from the vagina.  TREATMENT Depending on the length of the pregnancy and other circumstances, your health care provider may suggest bed rest. If necessary, there are medicines that can be given to stop contractions and to mature the fetal lungs. If labor happens before 34 weeks of pregnancy, a prolonged hospital stay may be recommended. Treatment depends on the condition of both you and the fetus.  WHAT SHOULD YOU DO IF YOU THINK YOU ARE IN PRETERM LABOR? Call your health care provider right away. You will need to go to the hospital to get checked immediately. HOW CAN YOU PREVENT PRETERM LABOR IN FUTURE PREGNANCIES? You should:   Stop smoking if you smoke.  Maintain healthy weight gain and avoid chemicals and drugs  that are not necessary.  Be watchful for any type of infection.  Inform your health care provider if you have a known history of preterm labor. Document Released: 01/28/2004 Document Revised: 07/10/2013 Document Reviewed: 12/10/2012 [facility] Patient Information 2015 [place], [place]. This information is not intended to replace advice given to you by your health care provider. Make sure you discuss any questions you have with your health care provider.

## 2015-08-05 NOTE — Progress Notes (Signed)
Low-risk OB appointment G2P1001 [redacted]w[redacted]d Estimated Date of Delivery: 09/24/15 BP 120/62 mmHg  Pulse 72  Wt 220 lb (99.791 kg)  LMP 12/15/2014  BP, weight, and urine reviewed.  Refer to obstetrical flow sheet for FH & FHR.  Reports good fm.  Denies regular uc's, lof, vb, or uti s/s. Went to [facility] yesterday d/t n/v, increased ruq pain, normal LFTs and lipase- thought maybe gastroenteritis, they recommended seeing GI- has already had gb out- possible duct issues. Has pregnancy Mcaid. Will discuss options w/ MD. To let us know/seek care if worsens.  Reviewed ptl s/s, fkc. Rx acyclovir  tid #90 w/ 3RF to begin @ 34wks Plan:  Continue routine obstetrical care  F/U in 2wks for OB appointment

## 2015-08-06 ENCOUNTER — Telehealth: Payer: Self-pay | Admitting: *Deleted

## 2015-08-06 MED ORDER — ONDANSETRON HCL 4 MG PO TABS: 4.0000 mg | ORAL_TABLET | Freq: Three times a day (TID) | ORAL | Status: DC | PRN

## 2015-08-06 NOTE — Telephone Encounter (Signed)
Pt states seen at [facility] and Provider thought MD was prescribing Zofran to [facility] but pharmacy does not have the Rx. Dr. Despina Hidden gave verbal order for Zofran 4 mg one tablet q 8 hours as needed for n/v, #20 e-scribed. Pt informed to f/u with pharmacy.

## 2015-08-19 ENCOUNTER — Encounter: Payer: Medicaid Other | Admitting: Advanced Practice Midwife

## 2015-08-24 ENCOUNTER — Ambulatory Visit (INDEPENDENT_AMBULATORY_CARE_PROVIDER_SITE_OTHER): Payer: Medicaid Other | Admitting: Women's Health

## 2015-08-24 ENCOUNTER — Encounter: Payer: Self-pay | Admitting: Women's Health

## 2015-08-24 VITALS — BP 128/62 | HR 80 | Wt 221.0 lb

## 2015-08-24 DIAGNOSIS — Z331 Pregnant state, incidental: Secondary | ICD-10-CM

## 2015-08-24 DIAGNOSIS — Z3493 Encounter for supervision of normal pregnancy, unspecified, third trimester: Secondary | ICD-10-CM

## 2015-08-24 DIAGNOSIS — Z1389 Encounter for screening for other disorder: Secondary | ICD-10-CM

## 2015-08-24 LAB — POCT URINALYSIS DIPSTICK
Blood, UA: NEGATIVE
Glucose, UA: NEGATIVE
Ketones, UA: NEGATIVE
LEUKOCYTES UA: NEGATIVE
NITRITE UA: NEGATIVE
Protein, UA: NEGATIVE

## 2015-08-24 NOTE — Patient Instructions (Signed)
Call the office ([phone]) or go to [facility] if:  You begin to have strong, frequent contractions  Your water breaks.  Sometimes it is a big gush of fluid, sometimes it is just a trickle that keeps getting your panties wet or running down your legs  You have vaginal bleeding.  It is normal to have a small amount of spotting if your cervix was checked.   You don't feel your baby moving like normal.  If you don't, get you something to eat and drink and lay down and focus on feeling your baby move.  You should feel at least 10 movements in 2 hours.  If you don't, you should call the office or go to [facility].    Preterm Labor Information Preterm labor is when labor starts at less than 37 weeks of pregnancy. The normal length of a pregnancy is 39 to 41 weeks. CAUSES Often, there is no identifiable underlying cause as to why a woman goes into preterm labor. One of the most common known causes of preterm labor is infection. Infections of the uterus, cervix, vagina, amniotic sac, bladder, kidney, or even the lungs (pneumonia) can cause labor to start. Other suspected causes of preterm labor include:   Urogenital infections, such as yeast infections and bacterial vaginosis.   Uterine abnormalities (uterine shape, uterine septum, fibroids, or bleeding from the placenta).   A cervix that has been operated on (it may fail to stay closed).   Malformations in the fetus.   Multiple gestations (twins, triplets, and so on).   Breakage of the amniotic sac.  RISK FACTORS  Having a previous history of preterm labor.   Having premature rupture of membranes (PROM).   Having a placenta that covers the opening of the cervix (placenta previa).   Having a placenta that separates from the uterus (placental abruption).   Having a cervix that is too weak to hold the fetus in the uterus (incompetent cervix).   Having too much fluid in the amniotic sac (polyhydramnios).   Taking  illegal drugs or smoking while pregnant.   Not gaining enough weight while pregnant.   Being younger than 18 and older than 25 years old.   Having a low socioeconomic status.   Being African American. SYMPTOMS Signs and symptoms of preterm labor include:   Menstrual-like cramps, abdominal pain, or back pain.  Uterine contractions that are regular, as frequent as six in an hour, regardless of their intensity (may be mild or painful).  Contractions that start on the top of the uterus and spread down to the lower abdomen and back.   A sense of increased pelvic pressure.   A watery or bloody mucus discharge that comes from the vagina.  TREATMENT Depending on the length of the pregnancy and other circumstances, your health care provider may suggest bed rest. If necessary, there are medicines that can be given to stop contractions and to mature the fetal lungs. If labor happens before 34 weeks of pregnancy, a prolonged hospital stay may be recommended. Treatment depends on the condition of both you and the fetus.  WHAT SHOULD YOU DO IF YOU THINK YOU ARE IN PRETERM LABOR? Call your health care provider right away. You will need to go to the hospital to get checked immediately. HOW CAN YOU PREVENT PRETERM LABOR IN FUTURE PREGNANCIES? You should:   Stop smoking if you smoke.  Maintain healthy weight gain and avoid chemicals and drugs that are not necessary.  Be watchful for   any type of infection.  Inform your health care provider if you have a known history of preterm labor. Document Released: 01/28/2004 Document Revised: 07/10/2013 Document Reviewed: 12/10/2012 [employer] Patient Information 2015 [employer]. This information is not intended to replace advice given to you by your health care provider. Make sure you discuss any questions you have with your health care provider.  

## 2015-08-24 NOTE — Progress Notes (Signed)
Low-risk OB appointment G2P1001 [redacted]w[redacted]d Estimated Date of Delivery: 09/24/15 BP 128/62 mmHg  Pulse 80  Wt 221 lb (100.245 kg)  LMP 12/15/2014  BP, weight, and urine reviewed.  Refer to obstetrical flow sheet for FH & FHR.  Reports good fm.  Denies regular uc's, lof, vb, or uti s/s. No complaints.Got tdap earlier Sept @ [facility].  Reviewed ptl s/s, fkc. Plan:  Continue routine obstetrical care  F/U in 1wk for OB appointment and gbs

## 2015-08-31 ENCOUNTER — Ambulatory Visit (INDEPENDENT_AMBULATORY_CARE_PROVIDER_SITE_OTHER): Payer: Medicaid Other | Admitting: Women's Health

## 2015-08-31 VITALS — BP 122/56 | HR 88 | Wt 223.0 lb

## 2015-08-31 DIAGNOSIS — Z3493 Encounter for supervision of normal pregnancy, unspecified, third trimester: Secondary | ICD-10-CM

## 2015-08-31 DIAGNOSIS — Z369 Encounter for antenatal screening, unspecified: Secondary | ICD-10-CM

## 2015-08-31 DIAGNOSIS — Z1389 Encounter for screening for other disorder: Secondary | ICD-10-CM

## 2015-08-31 DIAGNOSIS — Z331 Pregnant state, incidental: Secondary | ICD-10-CM

## 2015-08-31 LAB — POCT URINALYSIS DIPSTICK
GLUCOSE UA: NEGATIVE
Ketones, UA: NEGATIVE
Leukocytes, UA: NEGATIVE
NITRITE UA: NEGATIVE
Protein, UA: NEGATIVE
RBC UA: NEGATIVE

## 2015-08-31 LAB — OB RESULTS CONSOLE GBS
GBS: NEGATIVE
GBS: NEGATIVE

## 2015-08-31 NOTE — Progress Notes (Signed)
Low-risk OB appointment G2P1001 [redacted]w[redacted]d Estimated Date of Delivery: 09/24/15 BP 122/56 mmHg  Pulse 88  Wt 223 lb (101.152 kg)  LMP 12/15/2014  BP, weight, and urine reviewed.  Refer to obstetrical flow sheet for FH & FHR.  Reports good fm.  Denies regular uc's, lof, vb, or uti s/s. No complaints. GBS collected SVE per request: cx very posterior, unable to reach, vtx Reviewed ptl s/s, fkc. Plan:  Continue routine obstetrical care  F/U in 1wk for OB appointment

## 2015-08-31 NOTE — Patient Instructions (Signed)
Call the office ([phone]) or go to [facility] if:  You begin to have strong, frequent contractions  Your water breaks.  Sometimes it is a big gush of fluid, sometimes it is just a trickle that keeps getting your panties wet or running down your legs  You have vaginal bleeding.  It is normal to have a small amount of spotting if your cervix was checked.   You don't feel your baby moving like normal.  If you don't, get you something to eat and drink and lay down and focus on feeling your baby move.  You should feel at least 10 movements in 2 hours.  If you don't, you should call the office or go to [facility].    Braxton Hicks Contractions Contractions of the uterus can occur throughout pregnancy. Contractions are not always a sign that you are in labor.  WHAT ARE BRAXTON HICKS CONTRACTIONS?  Contractions that occur before labor are called Braxton Hicks contractions, or false labor. Toward the end of pregnancy (32-34 weeks), these contractions can develop more often and may become more forceful. This is not true labor because these contractions do not result in opening (dilatation) and thinning of the cervix. They are sometimes difficult to tell apart from true labor because these contractions can be forceful and people have different pain tolerances. You should not feel embarrassed if you go to the hospital with false labor. Sometimes, the only way to tell if you are in true labor is for your health care provider to look for changes in the cervix. If there are no prenatal problems or other health problems associated with the pregnancy, it is completely safe to be sent home with false labor and await the onset of true labor. HOW CAN YOU TELL THE DIFFERENCE BETWEEN TRUE AND FALSE LABOR? False Labor  The contractions of false labor are usually shorter and not as hard as those of true labor.   The contractions are usually irregular.   The contractions are often felt in the front of  the lower abdomen and in the groin.   The contractions may go away when you walk around or change positions while lying down.   The contractions get weaker and are shorter lasting as time goes on.   The contractions do not usually become progressively stronger, regular, and closer together as with true labor.  True Labor  Contractions in true labor last 30-70 seconds, become very regular, usually become more intense, and increase in frequency.   The contractions do not go away with walking.   The discomfort is usually felt in the top of the uterus and spreads to the lower abdomen and low back.   True labor can be determined by your health care provider with an exam. This will show that the cervix is dilating and getting thinner.  WHAT TO REMEMBER  Keep up with your usual exercises and follow other instructions given by your health care provider.   Take medicines as directed by your health care provider.   Keep your regular prenatal appointments.   Eat and drink lightly if you think you are going into labor.   If Braxton Hicks contractions are making you uncomfortable:   Change your position from lying down or resting to walking, or from walking to resting.   Sit and rest in a tub of warm water.   Drink 2-3 glasses of water. Dehydration may cause these contractions.   Do slow and deep breathing several times an hour.    WHEN SHOULD I SEEK IMMEDIATE MEDICAL CARE? Seek immediate medical care if:  Your contractions become stronger, more regular, and closer together.   You have fluid leaking or gushing from your vagina.   You have a fever.   You pass blood-tinged mucus.   You have vaginal bleeding.   You have continuous abdominal pain.   You have low back pain that you never had before.   You feel your baby's head pushing down and causing pelvic pressure.   Your baby is not moving as much as it used to.    This information is not intended to  replace advice given to you by your health care provider. Make sure you discuss any questions you have with your health care provider.   Document Released: 11/07/2005 Document Revised: 11/12/2013 Document Reviewed: 08/19/2013 Elsevier Interactive Patient Education 2016 [employer]  

## 2015-09-04 LAB — GC/CHLAMYDIA PROBE AMP
Chlamydia trachomatis, NAA: NEGATIVE
Neisseria gonorrhoeae by PCR: NEGATIVE

## 2015-09-04 LAB — CULTURE, BETA STREP (GROUP B ONLY): STREP GP B CULTURE: NEGATIVE

## 2015-09-07 ENCOUNTER — Encounter: Payer: Self-pay | Admitting: Women's Health

## 2015-09-07 ENCOUNTER — Ambulatory Visit (INDEPENDENT_AMBULATORY_CARE_PROVIDER_SITE_OTHER): Payer: Medicaid Other | Admitting: Women's Health

## 2015-09-07 VITALS — BP 128/62 | HR 84 | Wt 226.0 lb

## 2015-09-07 DIAGNOSIS — Z331 Pregnant state, incidental: Secondary | ICD-10-CM

## 2015-09-07 DIAGNOSIS — Z3493 Encounter for supervision of normal pregnancy, unspecified, third trimester: Secondary | ICD-10-CM

## 2015-09-07 DIAGNOSIS — Z1389 Encounter for screening for other disorder: Secondary | ICD-10-CM

## 2015-09-07 LAB — POCT URINALYSIS DIPSTICK
Ketones, UA: NEGATIVE
LEUKOCYTES UA: NEGATIVE
NITRITE UA: NEGATIVE
PROTEIN UA: NEGATIVE
RBC UA: NEGATIVE

## 2015-09-07 NOTE — Progress Notes (Signed)
Low-risk OB appointment G2P1001 626w4d Estimated Date of Delivery: 09/24/15 BP 128/62 mmHg  Pulse 84  Wt 226 lb (102.513 kg)  LMP 12/15/2014  BP, weight, and urine reviewed.  Refer to obstetrical flow sheet for FH & FHR.  Reports good fm.  Denies regular uc's, lof, vb, or uti s/s. No complaints. Felt a lot of movement yesterday and then felt big bump on Lt side of abdomen- not sure baby is head down.  Vtx confirmed by informal u/s Reviewed labor s/s, fkc. Plan:  Continue routine obstetrical care  F/U in 1wk for OB appointment

## 2015-09-07 NOTE — Patient Instructions (Signed)
Call the office ([phone]) or go to [facility] if:  You begin to have strong, frequent contractions  Your water breaks.  Sometimes it is a big gush of fluid, sometimes it is just a trickle that keeps getting your panties wet or running down your legs  You have vaginal bleeding.  It is normal to have a small amount of spotting if your cervix was checked.   You don't feel your baby moving like normal.  If you don't, get you something to eat and drink and lay down and focus on feeling your baby move.  You should feel at least 10 movements in 2 hours.  If you don't, you should call the office or go to [facility].    Braxton Hicks Contractions Contractions of the uterus can occur throughout pregnancy. Contractions are not always a sign that you are in labor.  WHAT ARE BRAXTON HICKS CONTRACTIONS?  Contractions that occur before labor are called Braxton Hicks contractions, or false labor. Toward the end of pregnancy (32-34 weeks), these contractions can develop more often and may become more forceful. This is not true labor because these contractions do not result in opening (dilatation) and thinning of the cervix. They are sometimes difficult to tell apart from true labor because these contractions can be forceful and people have different pain tolerances. You should not feel embarrassed if you go to the hospital with false labor. Sometimes, the only way to tell if you are in true labor is for your health care provider to look for changes in the cervix. If there are no prenatal problems or other health problems associated with the pregnancy, it is completely safe to be sent home with false labor and await the onset of true labor. HOW CAN YOU TELL THE DIFFERENCE BETWEEN TRUE AND FALSE LABOR? False Labor  The contractions of false labor are usually shorter and not as hard as those of true labor.   The contractions are usually irregular.   The contractions are often felt in the front of  the lower abdomen and in the groin.   The contractions may go away when you walk around or change positions while lying down.   The contractions get weaker and are shorter lasting as time goes on.   The contractions do not usually become progressively stronger, regular, and closer together as with true labor.  True Labor  Contractions in true labor last 30-70 seconds, become very regular, usually become more intense, and increase in frequency.   The contractions do not go away with walking.   The discomfort is usually felt in the top of the uterus and spreads to the lower abdomen and low back.   True labor can be determined by your health care provider with an exam. This will show that the cervix is dilating and getting thinner.  WHAT TO REMEMBER  Keep up with your usual exercises and follow other instructions given by your health care provider.   Take medicines as directed by your health care provider.   Keep your regular prenatal appointments.   Eat and drink lightly if you think you are going into labor.   If Braxton Hicks contractions are making you uncomfortable:   Change your position from lying down or resting to walking, or from walking to resting.   Sit and rest in a tub of warm water.   Drink 2-3 glasses of water. Dehydration may cause these contractions.   Do slow and deep breathing several times an hour.    WHEN SHOULD I SEEK IMMEDIATE MEDICAL CARE? Seek immediate medical care if:  Your contractions become stronger, more regular, and closer together.   You have fluid leaking or gushing from your vagina.   You have a fever.   You pass blood-tinged mucus.   You have vaginal bleeding.   You have continuous abdominal pain.   You have low back pain that you never had before.   You feel your baby's head pushing down and causing pelvic pressure.   Your baby is not moving as much as it used to.    This information is not intended to  replace advice given to you by your health care provider. Make sure you discuss any questions you have with your health care provider.   Document Released: 11/07/2005 Document Revised: 11/12/2013 Document Reviewed: 08/19/2013 Elsevier Interactive Patient Education 2016 [employer]  

## 2015-09-14 ENCOUNTER — Encounter: Payer: Self-pay | Admitting: Women's Health

## 2015-09-14 ENCOUNTER — Ambulatory Visit (INDEPENDENT_AMBULATORY_CARE_PROVIDER_SITE_OTHER): Payer: Medicaid Other | Admitting: Women's Health

## 2015-09-14 VITALS — BP 130/60 | HR 84 | Wt 226.0 lb

## 2015-09-14 DIAGNOSIS — Z331 Pregnant state, incidental: Secondary | ICD-10-CM

## 2015-09-14 DIAGNOSIS — Z3493 Encounter for supervision of normal pregnancy, unspecified, third trimester: Secondary | ICD-10-CM

## 2015-09-14 DIAGNOSIS — Z1389 Encounter for screening for other disorder: Secondary | ICD-10-CM

## 2015-09-14 LAB — POCT URINALYSIS DIPSTICK
Blood, UA: NEGATIVE
Glucose, UA: NEGATIVE
KETONES UA: NEGATIVE
LEUKOCYTES UA: NEGATIVE
Nitrite, UA: NEGATIVE
PROTEIN UA: NEGATIVE

## 2015-09-14 NOTE — Progress Notes (Signed)
Low-risk OB appointment G2P1001 7580w4d Estimated Date of Delivery: 09/24/15 BP 130/60 mmHg  Pulse 84  Wt 226 lb (102.513 kg)  LMP 12/15/2014  BP, weight, and urine reviewed.  Refer to obstetrical flow sheet for FH & FHR.  Reports good fm.  Denies regular uc's, lof, vb, or uti s/s. Lots of pressure, sometimes makes it hard for urine to come out.  Declines SVE Reviewed labor s/s, fkc. Plan:  Continue routine obstetrical care  F/U in 1wk for OB appointment

## 2015-09-14 NOTE — Patient Instructions (Signed)
Call the office ([phone]) or go to [facility] if:  You begin to have strong, frequent contractions  Your water breaks.  Sometimes it is a big gush of fluid, sometimes it is just a trickle that keeps getting your panties wet or running down your legs  You have vaginal bleeding.  It is normal to have a small amount of spotting if your cervix was checked.   You don't feel your baby moving like normal.  If you don't, get you something to eat and drink and lay down and focus on feeling your baby move.  You should feel at least 10 movements in 2 hours.  If you don't, you should call the office or go to [facility].    Braxton Hicks Contractions Contractions of the uterus can occur throughout pregnancy. Contractions are not always a sign that you are in labor.  WHAT ARE BRAXTON HICKS CONTRACTIONS?  Contractions that occur before labor are called Braxton Hicks contractions, or false labor. Toward the end of pregnancy (32-34 weeks), these contractions can develop more often and may become more forceful. This is not true labor because these contractions do not result in opening (dilatation) and thinning of the cervix. They are sometimes difficult to tell apart from true labor because these contractions can be forceful and people have different pain tolerances. You should not feel embarrassed if you go to the hospital with false labor. Sometimes, the only way to tell if you are in true labor is for your health care provider to look for changes in the cervix. If there are no prenatal problems or other health problems associated with the pregnancy, it is completely safe to be sent home with false labor and await the onset of true labor. HOW CAN YOU TELL THE DIFFERENCE BETWEEN TRUE AND FALSE LABOR? False Labor  The contractions of false labor are usually shorter and not as hard as those of true labor.   The contractions are usually irregular.   The contractions are often felt in the front of  the lower abdomen and in the groin.   The contractions may go away when you walk around or change positions while lying down.   The contractions get weaker and are shorter lasting as time goes on.   The contractions do not usually become progressively stronger, regular, and closer together as with true labor.  True Labor  Contractions in true labor last 30-70 seconds, become very regular, usually become more intense, and increase in frequency.   The contractions do not go away with walking.   The discomfort is usually felt in the top of the uterus and spreads to the lower abdomen and low back.   True labor can be determined by your health care provider with an exam. This will show that the cervix is dilating and getting thinner.  WHAT TO REMEMBER  Keep up with your usual exercises and follow other instructions given by your health care provider.   Take medicines as directed by your health care provider.   Keep your regular prenatal appointments.   Eat and drink lightly if you think you are going into labor.   If Braxton Hicks contractions are making you uncomfortable:   Change your position from lying down or resting to walking, or from walking to resting.   Sit and rest in a tub of warm water.   Drink 2-3 glasses of water. Dehydration may cause these contractions.   Do slow and deep breathing several times an hour.    WHEN SHOULD I SEEK IMMEDIATE MEDICAL CARE? Seek immediate medical care if:  Your contractions become stronger, more regular, and closer together.   You have fluid leaking or gushing from your vagina.   You have a fever.   You pass blood-tinged mucus.   You have vaginal bleeding.   You have continuous abdominal pain.   You have low back pain that you never had before.   You feel your baby's head pushing down and causing pelvic pressure.   Your baby is not moving as much as it used to.    This information is not intended to  replace advice given to you by your health care provider. Make sure you discuss any questions you have with your health care provider.   Document Released: 11/07/2005 Document Revised: 11/12/2013 Document Reviewed: 08/19/2013 Elsevier Interactive Patient Education 2016 [employer]  

## 2015-09-21 ENCOUNTER — Ambulatory Visit (INDEPENDENT_AMBULATORY_CARE_PROVIDER_SITE_OTHER): Payer: Medicaid Other | Admitting: Women's Health

## 2015-09-21 ENCOUNTER — Encounter: Payer: Self-pay | Admitting: Women's Health

## 2015-09-21 VITALS — BP 136/78 | HR 80 | Wt 223.0 lb

## 2015-09-21 DIAGNOSIS — Z331 Pregnant state, incidental: Secondary | ICD-10-CM

## 2015-09-21 DIAGNOSIS — Z1389 Encounter for screening for other disorder: Secondary | ICD-10-CM

## 2015-09-21 DIAGNOSIS — Z3493 Encounter for supervision of normal pregnancy, unspecified, third trimester: Secondary | ICD-10-CM

## 2015-09-21 LAB — POCT URINALYSIS DIPSTICK
GLUCOSE UA: NEGATIVE
Ketones, UA: NEGATIVE
Leukocytes, UA: NEGATIVE
NITRITE UA: NEGATIVE
Protein, UA: NEGATIVE
RBC UA: NEGATIVE

## 2015-09-21 NOTE — Patient Instructions (Signed)
Your induction is scheduled for 11/10 @ 6:30am. [facility] (Emergency) entrance and let them know you are there to be induced. They will send someone from Labor & Delivery to come get you.    Call the office [phone]) or go to [facility] if:  You begin to have strong, frequent contractions  Your water breaks.  Sometimes it is a big gush of fluid, sometimes it is just a trickle that keeps getting your panties wet or running down your legs  You have vaginal bleeding.  It is normal to have a small amount of spotting if your cervix was checked.   You don't feel your baby moving like normal.  If you don't, get you something to eat and drink and lay down and focus on feeling your baby move.  You should feel at least 10 movements in 2 hours.  If you don't, you should call the office or go to [facility].    [facility] Contractions of the uterus can occur throughout pregnancy. Contractions are not always a sign that you are in labor.  WHAT ARE BRAXTON HICKS CONTRACTIONS?  Contractions that occur before labor are called Braxton Hicks contractions, or false labor. Toward the end of pregnancy (32-34 weeks), these contractions can develop more often and may become more forceful. This is not true labor because these contractions do not result in opening (dilatation) and thinning of the cervix. They are sometimes difficult to tell apart from true labor because these contractions can be forceful and people have different pain tolerances. You should not feel embarrassed if you go to the hospital with false labor. Sometimes, the only way to tell if you are in true labor is for your health care provider to look for changes in the cervix. If there are no prenatal problems or other health problems associated with the pregnancy, it is completely safe to be sent home with false labor and await the onset of true labor. HOW CAN YOU TELL THE DIFFERENCE  BETWEEN TRUE AND FALSE LABOR? False Labor  The contractions of false labor are usually shorter and not as hard as those of true labor.   The contractions are usually irregular.   The contractions are often felt in the front of the lower abdomen and in the groin.   The contractions may go away when you walk around or change positions while lying down.   The contractions get weaker and are shorter lasting as time goes on.   The contractions do not usually become progressively stronger, regular, and closer together as with true labor.  True Labor  Contractions in true labor last 30-70 seconds, become very regular, usually become more intense, and increase in frequency.   The contractions do not go away with walking.   The discomfort is usually felt in the top of the uterus and spreads to the lower abdomen and low back.   True labor can be determined by your health care provider with an exam. This will show that the cervix is dilating and getting thinner.  WHAT TO REMEMBER  Keep up with your usual exercises and follow other instructions given by your health care provider.   Take medicines as directed by your health care provider.   Keep your regular prenatal appointments.   Eat and drink lightly if you think you are going into labor.   If Braxton Hicks contractions are making you uncomfortable:   Change your position from lying down or  resting to walking, or from walking to resting.   Sit and rest in a tub of warm water.   Drink 2-3 glasses of water. Dehydration may cause these contractions.   Do slow and deep breathing several times an hour.  WHEN SHOULD I SEEK IMMEDIATE MEDICAL CARE? Seek immediate medical care if:  Your contractions become stronger, more regular, and closer together.   You have fluid leaking or gushing from your vagina.   You have a fever.   You pass blood-tinged mucus.   You have vaginal bleeding.   You have continuous  abdominal pain.   You have low back pain that you never had before.   You feel your baby's head pushing down and causing pelvic pressure.   Your baby is not moving as much as it used to.    This information is not intended to replace advice given to you by your health care provider. Make sure you discuss any questions you have with your health care provider.   Document Released: 11/07/2005 Document Revised: 11/12/2013 Document Reviewed: 08/19/2013 Elsevier Interactive Patient Education [employer].

## 2015-09-21 NOTE — Progress Notes (Signed)
Low-risk OB appointment G2P1001 2073w4d Estimated Date of Delivery: 09/24/15 BP 136/78 mmHg  Pulse 80  Wt 223 lb (101.152 kg)  LMP 12/15/2014  BP, weight, and urine reviewed.  Refer to obstetrical flow sheet for FH & FHR.  Reports good fm.  Denies regular uc's, lof, vb, or uti s/s. No complaints. Declines SVE Reviewed labor s/s, fkc. IOL scheduled for 11/10 @ 0630 if needed for postdates Plan:  Continue routine obstetrical care  F/U in 1wk for OB appointment

## 2015-09-24 ENCOUNTER — Ambulatory Visit (INDEPENDENT_AMBULATORY_CARE_PROVIDER_SITE_OTHER): Payer: Medicaid Other | Admitting: Advanced Practice Midwife

## 2015-09-24 ENCOUNTER — Inpatient Hospital Stay (HOSPITAL_COMMUNITY)
Admission: AD | Admit: 2015-09-24 | Discharge: 2015-09-27 | DRG: 775 | Disposition: A | Discharge status: Home / Self Care | Payer: Medicaid Other | Source: Ambulatory Visit | Attending: Obstetrics & Gynecology | Admitting: Obstetrics & Gynecology

## 2015-09-24 ENCOUNTER — Encounter (HOSPITAL_COMMUNITY): Payer: Self-pay | Admitting: *Deleted

## 2015-09-24 VITALS — BP 120/68 | HR 86 | Wt 224.0 lb

## 2015-09-24 DIAGNOSIS — Z3A40 40 weeks gestation of pregnancy: Secondary | ICD-10-CM | POA: Diagnosis not present

## 2015-09-24 DIAGNOSIS — K219 Gastro-esophageal reflux disease without esophagitis: Secondary | ICD-10-CM | POA: Diagnosis present

## 2015-09-24 DIAGNOSIS — O9952 Diseases of the respiratory system complicating childbirth: Secondary | ICD-10-CM | POA: Diagnosis present

## 2015-09-24 DIAGNOSIS — Z3493 Encounter for supervision of normal pregnancy, unspecified, third trimester: Secondary | ICD-10-CM

## 2015-09-24 DIAGNOSIS — Z833 Family history of diabetes mellitus: Secondary | ICD-10-CM

## 2015-09-24 DIAGNOSIS — F329 Major depressive disorder, single episode, unspecified: Secondary | ICD-10-CM | POA: Diagnosis present

## 2015-09-24 DIAGNOSIS — O4292 Full-term premature rupture of membranes, unspecified as to length of time between rupture and onset of labor: Secondary | ICD-10-CM | POA: Diagnosis present

## 2015-09-24 DIAGNOSIS — O9962 Diseases of the digestive system complicating childbirth: Secondary | ICD-10-CM | POA: Diagnosis present

## 2015-09-24 DIAGNOSIS — O99344 Other mental disorders complicating childbirth: Secondary | ICD-10-CM | POA: Diagnosis present

## 2015-09-24 DIAGNOSIS — Z331 Pregnant state, incidental: Secondary | ICD-10-CM

## 2015-09-24 DIAGNOSIS — J45909 Unspecified asthma, uncomplicated: Secondary | ICD-10-CM | POA: Diagnosis present

## 2015-09-24 DIAGNOSIS — Z825 Family history of asthma and other chronic lower respiratory diseases: Secondary | ICD-10-CM

## 2015-09-24 DIAGNOSIS — Z801 Family history of malignant neoplasm of trachea, bronchus and lung: Secondary | ICD-10-CM | POA: Diagnosis not present

## 2015-09-24 DIAGNOSIS — Z8249 Family history of ischemic heart disease and other diseases of the circulatory system: Secondary | ICD-10-CM

## 2015-09-24 DIAGNOSIS — Z1389 Encounter for screening for other disorder: Secondary | ICD-10-CM

## 2015-09-24 DIAGNOSIS — Z3A39 39 weeks gestation of pregnancy: Secondary | ICD-10-CM

## 2015-09-24 DIAGNOSIS — Z3483 Encounter for supervision of other normal pregnancy, third trimester: Secondary | ICD-10-CM

## 2015-09-24 DIAGNOSIS — M1990 Unspecified osteoarthritis, unspecified site: Secondary | ICD-10-CM | POA: Diagnosis present

## 2015-09-24 DIAGNOSIS — O480 Post-term pregnancy: Principal | ICD-10-CM | POA: Diagnosis present

## 2015-09-24 DIAGNOSIS — IMO0002 Reserved for concepts with insufficient information to code with codable children: Secondary | ICD-10-CM | POA: Diagnosis present

## 2015-09-24 DIAGNOSIS — O360131 Maternal care for anti-D [Rh] antibodies, third trimester, fetus 1: Secondary | ICD-10-CM

## 2015-09-24 HISTORY — DX: Unspecified asthma, uncomplicated: J45.909

## 2015-09-24 LAB — CBC
HEMATOCRIT: 35.2 % — AB (ref 36.0–46.0)
HEMOGLOBIN: 12 g/dL (ref 12.0–15.0)
MCH: 27.8 pg (ref 26.0–34.0)
MCHC: 34.1 g/dL (ref 30.0–36.0)
MCV: 81.7 fL (ref 78.0–100.0)
Platelets: 212 10*3/uL (ref 150–400)
RBC: 4.31 MIL/uL (ref 3.87–5.11)
RDW: 13.4 % (ref 11.5–15.5)
WBC: 9.2 10*3/uL (ref 4.0–10.5)

## 2015-09-24 LAB — AMNISURE RUPTURE OF MEMBRANE (ROM) NOT AT ARMC: AMNISURE: POSITIVE

## 2015-09-24 MED ORDER — FLEET ENEMA 7-19 GM/118ML RE ENEM: 1.0000 | ENEMA | RECTAL | Status: DC | PRN

## 2015-09-24 MED ORDER — OXYTOCIN 40 UNITS IN LACTATED RINGERS INFUSION - SIMPLE MED
1.0000 m[IU]/min | INTRAVENOUS | Status: DC
Start: 2015-09-24 — End: 2015-09-25
  Administered 2015-09-25: 2 m[IU]/min via INTRAVENOUS
  Filled 2015-09-24: qty 1000

## 2015-09-24 MED ORDER — OXYTOCIN BOLUS FROM INFUSION: 500.0000 mL | INTRAVENOUS | Status: DC

## 2015-09-24 MED ORDER — ONDANSETRON HCL 4 MG/2ML IJ SOLN
4.0000 mg | Freq: Four times a day (QID) | INTRAMUSCULAR | Status: DC | PRN
  Filled 2015-09-24: qty 2

## 2015-09-24 MED ORDER — GUAIFENESIN 100 MG/5ML PO SOLN
5.0000 mL | ORAL | Status: DC | PRN
  Administered 2015-09-24: 100 mg via ORAL
  Filled 2015-09-24: qty 15

## 2015-09-24 MED ORDER — OXYCODONE-ACETAMINOPHEN 5-325 MG PO TABS: 2.0000 | ORAL_TABLET | ORAL | Status: DC | PRN

## 2015-09-24 MED ORDER — PHENYLEPHRINE 40 MCG/ML (10ML) SYRINGE FOR IV PUSH (FOR BLOOD PRESSURE SUPPORT)
80.0000 ug | PREFILLED_SYRINGE | INTRAVENOUS | Status: DC | PRN
  Filled 2015-09-24: qty 20
  Filled 2015-09-24: qty 2

## 2015-09-24 MED ORDER — EPHEDRINE 5 MG/ML INJ
10.0000 mg | INTRAVENOUS | Status: DC | PRN
Start: 2015-09-24 — End: 2015-09-25
  Filled 2015-09-24: qty 2

## 2015-09-24 MED ORDER — ZOLPIDEM TARTRATE 5 MG PO TABS: 5.0000 mg | ORAL_TABLET | Freq: Every evening | ORAL | Status: DC | PRN

## 2015-09-24 MED ORDER — FENTANYL CITRATE (PF) 100 MCG/2ML IJ SOLN
50.0000 ug | INTRAMUSCULAR | Status: DC | PRN
  Administered 2015-09-24: 50 ug via INTRAVENOUS
  Filled 2015-09-24: qty 2

## 2015-09-24 MED ORDER — LACTATED RINGERS IV SOLN
INTRAVENOUS | Status: DC
  Administered 2015-09-24 – 2015-09-25 (×2): via INTRAVENOUS

## 2015-09-24 MED ORDER — LACTATED RINGERS IV SOLN: 500.0000 mL | INTRAVENOUS | Status: DC | PRN

## 2015-09-24 MED ORDER — DIPHENHYDRAMINE HCL 50 MG/ML IJ SOLN: 12.5000 mg | INTRAMUSCULAR | Status: DC | PRN

## 2015-09-24 MED ORDER — FENTANYL 2.5 MCG/ML BUPIVACAINE 1/10 % EPIDURAL INFUSION (WH - ANES)
14.0000 mL/h | INTRAMUSCULAR | Status: DC | PRN
  Administered 2015-09-25 (×3): 14 mL/h via EPIDURAL
  Filled 2015-09-24 (×2): qty 125

## 2015-09-24 MED ORDER — MISOPROSTOL 200 MCG PO TABS
50.0000 ug | ORAL_TABLET | ORAL | Status: DC
  Administered 2015-09-24: 50 ug via ORAL
  Filled 2015-09-24: qty 0.5

## 2015-09-24 MED ORDER — LIDOCAINE HCL (PF) 1 % IJ SOLN
30.0000 mL | INTRAMUSCULAR | Status: DC | PRN
  Filled 2015-09-24: qty 30

## 2015-09-24 MED ORDER — OXYCODONE-ACETAMINOPHEN 5-325 MG PO TABS: 1.0000 | ORAL_TABLET | ORAL | Status: DC | PRN

## 2015-09-24 MED ORDER — ACETAMINOPHEN 325 MG PO TABS: 650.0000 mg | ORAL_TABLET | ORAL | Status: DC | PRN

## 2015-09-24 MED ORDER — TERBUTALINE SULFATE 1 MG/ML IJ SOLN
0.2500 mg | Freq: Once | INTRAMUSCULAR | Status: DC | PRN
  Filled 2015-09-24: qty 1

## 2015-09-24 MED ORDER — OXYTOCIN 40 UNITS IN LACTATED RINGERS INFUSION - SIMPLE MED: 62.5000 mL/h | INTRAVENOUS | Status: DC

## 2015-09-24 MED ORDER — CITRIC ACID-SODIUM CITRATE 334-500 MG/5ML PO SOLN: 30.0000 mL | ORAL | Status: DC | PRN

## 2015-09-24 NOTE — MAU Note (Signed)
Clear fluid noted this morning, [facility]

## 2015-09-24 NOTE — Progress Notes (Signed)
Pt c/o coughing with wheezing. Per CNM pt allowed inhaler as well a order in for Robitussin.

## 2015-09-24 NOTE — Progress Notes (Signed)
WORK IN for INCREASED DC  Has been "leaking on and off" all day fluid. Started mucusy, now is thin and watery. Active fetus.  When I put speculum in, some thin water came running out.  However, no pooling, fern neg, no fluid leaking after bouncing the head, membranes palpable over vtx.  Will have to send to MAU for amnisure.  Discussed wth J Rasch FNP

## 2015-09-24 NOTE — Progress Notes (Signed)
Pt states that she has been having a vaginal discharge that started this morning.

## 2015-09-24 NOTE — Progress Notes (Signed)
   Jasmine Maynard is a 24 y.o. G2P1001 at 3267w0d  admitted for PROM  Subjective: Ctx are painful, q 2 minutes, s/p 50mcg cytotec PO  Objective: Filed Vitals:   09/24/15 1737 09/24/15 1858 09/24/15 1945  BP: 130/76 138/81 135/67  Pulse: 89 86 76  Temp: 98.6 F (37 C) 98.4 F (36.9 C) 98 F (36.7 C)  TempSrc: Oral Oral Oral  Resp: 18 18 18       FHT:  FHR: 145 bpm, variability: moderate,  accelerations:  Present,  decelerations:  Absent UC:   regular, every 2 minutes SVE:   Dilation: 1.5 Effacement (%): 50 Exam by:: Jasmine Maynard CNM   Labs: Lab Results  Component Value Date   WBC 9.2 09/24/2015   HGB 12.0 09/24/2015   HCT 35.2* 09/24/2015   MCV 81.7 09/24/2015   PLT 212 09/24/2015    Assessment / Plan: IOL for PROM, contractions s/p cytotec Will not give any more cytotec unless ctx decrease.   Labor: Progressing normally Fetal Wellbeing:  Category I Pain Control:  Fentanyl Anticipated MOD:  NSVD  Jasmine Maynard 09/24/2015, 11:15 PM

## 2015-09-24 NOTE — H&P (Signed)
Jasmine Maynard is a 24 y.o. female G2P1001 with IUP at [redacted]w[redacted]d presenting for PROM. She has been leaking  Off an on all day,startomg arpimd 0930, no contractions.  active fetal movement.   [facility]  Prenatal History/Complications:  None  Past Medical History: Past Medical History  Diagnosis Date  . Herpes   . ADHD (attention deficit hyperactivity disorder)   . Perennial allergic rhinitis   . Vertigo   . Heartburn   . Pregnant 01/21/2015  . Asthma   . DDD (degenerative disc disease)   . Depression     fine problem    Past Surgical History: Past Surgical History  Procedure Laterality Date  . Intrauterine device insertion  2011  . Myringotomy  2011    right  . Cholecystectomy  02/13/2012    Procedure: LAPAROSCOPIC CHOLECYSTECTOMY;  Surgeon: Fabio Bering, MD;  Location: [facility];  Service: General;  Laterality: N/A;    Obstetrical History: OB History    Gravida Para Term Preterm AB TAB SAB Ectopic Multiple Living   Gynecological History:   Social History: Social History   Social History  . Marital Status: Married    Spouse Name: N/A  . Number of Children: N/A  . Years of Education: N/A   Social History Main Topics  . Smoking status: Never Smoker   . Smokeless tobacco: Never Used  . Alcohol Use: No  . Drug Use: No  . Sexual Activity: Not Currently    Birth Control/ Protection: None   Other Topics Concern  . None   Social History Narrative    Family History: Family History  Problem Relation Age of Onset  . Diabetes Paternal Grandfather   . Heart attack Paternal Grandfather   . Other Paternal Grandfather     aneursym  . Diabetes Paternal Grandmother   . Cancer Paternal Grandmother     breast,lung  . Other Maternal Grandmother     heart issues; has stent; has a eating disorder  . COPD Father   . Other Father     heart issues  . COPD Mother   . Mental illness Sister     Allergies: Allergies  Allergen  Reactions  . Amoxicillin     Reaction: INEFFECTIVE  . Avelox [Moxifloxacin Hcl In Nacl] Diarrhea and Nausea And Vomiting  . Cefdinir     ineffective  . Doxycycline Nausea And Vomiting  . Latex Rash    Prescriptions prior to admission  Medication Sig Dispense Refill Last Dose  . acetaminophen (TYLENOL) 325 MG tablet Take 650 mg by mouth every 6 (six) hours as needed for moderate pain or headache.   09/23/2015 at Unknown time  . acyclovir (ZOVIRAX) 400 MG tablet Take 1 tablet (400 mg total) by mouth 3 (three) times daily. (Patient taking differently: Take 400 mg by mouth 3 (three) times daily. Started at 36 weeks) 90 tablet 3 09/24/2015 at 1400  . albuterol (PROVENTIL HFA;VENTOLIN HFA) 108 (90 BASE) MCG/ACT inhaler Inhale 2 puffs into the lungs every 6 (six) hours as needed for wheezing or shortness of breath. 1 Inhaler 2 Past Week at Unknown time  . Calcium & Magnesium Carbonates (MYLANTA PO) Take 15 mLs by mouth daily as needed (heartburn).    Past Week at Unknown time  . cetirizine (ZYRTEC) 10 MG tablet Take 10 mg by mouth daily.   Past Week at Unknown time  .  diphenhydrAMINE (SOMINEX) 25 MG tablet Take 25 mg by mouth at bedtime as needed for allergies (allergic skin reaction).    Past Week at Unknown time  . Fexofenadine HCl (ALLEGRA ALLERGY PO) Take 1 tablet by mouth daily.   Past Month at Unknown time  . omeprazole (PRILOSEC) 20 MG capsule Take 1 capsule (20 mg total) by mouth 2 (two) times daily before a meal. 60 capsule 11 09/23/2015 at Unknown time  . ondansetron (ZOFRAN) 4 MG tablet Take 1 tablet (4 mg total) by mouth every 8 (eight) hours as needed for nausea or vomiting. 20 tablet 0 Past Week at Unknown time  . prenatal vitamin w/FE, FA (PRENATAL 1 + 1) 27-1 MG TABS tablet Take 1 tablet by mouth daily at 12 noon. (Patient taking differently: Take 2 tablets by mouth daily at 12 noon. ) 30 each 11 Past Week at Unknown time  . pseudoephedrine (SUDAFED) 60 MG tablet Take 60 mg by mouth every  4 (four) hours as needed for congestion.   09/23/2015 at Unknown time     Prenatal Transfer Tool  Maternal Diabetes: No Genetic Screening: Declined Maternal Ultrasounds/Referrals: Normal Fetal Ultrasounds or other Referrals:  None Maternal Substance Abuse:  No Significant Maternal Medications:  None Significant Maternal Lab Results: Lab values include: Group B Strep negative     Review of Systems   Constitutional: Negative for fever and chills Eyes: Negative for visual disturbances Respiratory: Negative for shortness of breath, dyspnea Cardiovascular: Negative for chest pain or palpitations  Gastrointestinal: Negative for vomiting, diarrhea and constipation.  POSITIVE for abdominal pain (contractions) Genitourinary: Negative for dysuria and urgency Musculoskeletal: Negative for back pain, joint pain, myalgias  Neurological: Negative for dizziness and headaches      Blood pressure 130/76, pulse 89, temperature 98.6 F (37 C), temperature source Oral, resp. rate 18, last menstrual period 12/15/2014. General appearance: alert, cooperative and no distress Lungs: clear to auscultation bilaterally Heart: regular rate and rhythm Abdomen: soft, non-tender; bowel sounds normal Pelvic: 1-2 /50/-2 Extremities: Homans sign is negative, no sign of DVT DTR's 2+ Presentation: cephalic Fetal monitoring  Baseline: 140 bpm, Variability: Good {> 6 bpm), Accelerations: Reactive and Decelerations: Absent Uterine activity  None     Prenatal labs: ABO, Rh: O/Negative/-- (03/24 0841) Antibody: Negative (08/10 0910) Rubella: !Error! RPR: Non Reactive (08/10 0910)  HBsAg: Negative (03/24 0841)  HIV: Non Reactive (08/10 0910)    [facility]  Initiated Care at  7.6wks  FOB Lucy ChrisSteven Machi, 23yo, 1st baby, married   Dating By 6wk u/s  Pap 02/11/15: neg  GC/CT Initial:  -/-              36+wks:  -/-  Genetic Screen NT/IT/AFP: declined  CF screen declined  Anatomic US Normal female   Flu vaccine declined  Tdap Recommended ~ 28wks- got in Sept at [facility]  Glucose Screen  Early 2 hr normal   Repeat normal: 72/113/79  GBS neg  Feed Preference breast  Contraception IUD  Circumcision Yes, at [facility]  Childbirth Classes declined  Pediatrician [facility]     Results for orders placed or performed during the hospital encounter of 09/24/15 (from the past 24 hour(s))  Amnisure rupture of membrane (rom)not at [facility]   Collection Time: 09/24/15  5:39 PM  Result Value Ref Range   Amnisure ROM POSITIVE     Assessment: Jasmine Maynard is a 24 y.o. G2P1001 with an IUP at 7648w0d presenting for PROM  Plan: #Labor: foley placed  and inflated with 60cc H20,  Will start oral cytotec, then pitocin after foley falls out #Pain:  epidural #FWB Cat 1 #ID: GBS: neg   CRESENZO-DISHMAN,Raydel Hosick 09/24/2015, 6:11 PM

## 2015-09-25 ENCOUNTER — Inpatient Hospital Stay (HOSPITAL_COMMUNITY): Payer: Medicaid Other | Admitting: Anesthesiology

## 2015-09-25 ENCOUNTER — Encounter (HOSPITAL_COMMUNITY): Payer: Self-pay | Admitting: Emergency Medicine

## 2015-09-25 DIAGNOSIS — O4292 Full-term premature rupture of membranes, unspecified as to length of time between rupture and onset of labor: Secondary | ICD-10-CM

## 2015-09-25 DIAGNOSIS — Z3A40 40 weeks gestation of pregnancy: Secondary | ICD-10-CM

## 2015-09-25 DIAGNOSIS — O480 Post-term pregnancy: Secondary | ICD-10-CM

## 2015-09-25 DIAGNOSIS — O9952 Diseases of the respiratory system complicating childbirth: Secondary | ICD-10-CM

## 2015-09-25 DIAGNOSIS — F329 Major depressive disorder, single episode, unspecified: Secondary | ICD-10-CM

## 2015-09-25 DIAGNOSIS — O99344 Other mental disorders complicating childbirth: Secondary | ICD-10-CM

## 2015-09-25 LAB — RPR: RPR Ser Ql: NONREACTIVE

## 2015-09-25 MED ORDER — IBUPROFEN 600 MG PO TABS
600.0000 mg | ORAL_TABLET | Freq: Four times a day (QID) | ORAL | Status: DC
Start: 2015-09-25 — End: 2015-09-27
  Administered 2015-09-25 – 2015-09-27 (×9): 600 mg via ORAL
  Filled 2015-09-25 (×9): qty 1

## 2015-09-25 MED ORDER — LIDOCAINE HCL (PF) 1 % IJ SOLN
INTRAMUSCULAR | Status: DC | PRN
  Administered 2015-09-25: 5 mL via EPIDURAL
  Administered 2015-09-25: 3 mL via EPIDURAL
  Administered 2015-09-25: 2 mL via EPIDURAL

## 2015-09-25 MED ORDER — BENZOCAINE-MENTHOL 20-0.5 % EX AERO
1.0000 "application " | INHALATION_SPRAY | CUTANEOUS | Status: DC | PRN
  Administered 2015-09-25: 1 via TOPICAL
  Filled 2015-09-25: qty 56

## 2015-09-25 MED ORDER — WITCH HAZEL-GLYCERIN EX PADS
1.0000 | MEDICATED_PAD | CUTANEOUS | Status: DC | PRN
Start: 2015-09-25 — End: 2015-09-27
  Administered 2015-09-25: 1 via TOPICAL

## 2015-09-25 MED ORDER — LANOLIN HYDROUS EX OINT: TOPICAL_OINTMENT | CUTANEOUS | Status: DC | PRN

## 2015-09-25 MED ORDER — SENNOSIDES-DOCUSATE SODIUM 8.6-50 MG PO TABS
2.0000 | ORAL_TABLET | ORAL | Status: DC
  Administered 2015-09-25 – 2015-09-27 (×2): 2 via ORAL
  Filled 2015-09-25 (×2): qty 2

## 2015-09-25 MED ORDER — BISACODYL 10 MG RE SUPP: 10.0000 mg | Freq: Every day | RECTAL | Status: DC | PRN

## 2015-09-25 MED ORDER — ZOLPIDEM TARTRATE 5 MG PO TABS
5.0000 mg | ORAL_TABLET | Freq: Every evening | ORAL | Status: DC | PRN
Start: 2015-09-25 — End: 2015-09-27

## 2015-09-25 MED ORDER — ONDANSETRON HCL 4 MG/2ML IJ SOLN: 4.0000 mg | INTRAMUSCULAR | Status: DC | PRN

## 2015-09-25 MED ORDER — PRENATAL MULTIVITAMIN CH
1.0000 | ORAL_TABLET | Freq: Every day | ORAL | Status: DC
  Administered 2015-09-26 – 2015-09-27 (×2): 1 via ORAL
  Filled 2015-09-25 (×2): qty 1

## 2015-09-25 MED ORDER — FLEET ENEMA 7-19 GM/118ML RE ENEM: 1.0000 | ENEMA | Freq: Every day | RECTAL | Status: DC | PRN

## 2015-09-25 MED ORDER — OXYTOCIN 40 UNITS IN LACTATED RINGERS INFUSION - SIMPLE MED: 62.5000 mL/h | INTRAVENOUS | Status: DC | PRN

## 2015-09-25 MED ORDER — OXYCODONE-ACETAMINOPHEN 5-325 MG PO TABS
1.0000 | ORAL_TABLET | ORAL | Status: DC | PRN
  Administered 2015-09-25: 1 via ORAL
  Filled 2015-09-25: qty 1

## 2015-09-25 MED ORDER — ONDANSETRON HCL 4 MG PO TABS: 4.0000 mg | ORAL_TABLET | ORAL | Status: DC | PRN

## 2015-09-25 MED ORDER — DIPHENHYDRAMINE HCL 25 MG PO CAPS: 25.0000 mg | ORAL_CAPSULE | Freq: Four times a day (QID) | ORAL | Status: DC | PRN

## 2015-09-25 MED ORDER — DIBUCAINE 1 % RE OINT
1.0000 "application " | TOPICAL_OINTMENT | RECTAL | Status: DC | PRN
  Administered 2015-09-25: 1 via RECTAL
  Filled 2015-09-25: qty 28

## 2015-09-25 MED ORDER — ACETAMINOPHEN 325 MG PO TABS
650.0000 mg | ORAL_TABLET | ORAL | Status: DC | PRN
Start: 2015-09-25 — End: 2015-09-27

## 2015-09-25 MED ORDER — METHYLERGONOVINE MALEATE 0.2 MG/ML IJ SOLN: 0.2000 mg | INTRAMUSCULAR | Status: DC | PRN

## 2015-09-25 MED ORDER — METHYLERGONOVINE MALEATE 0.2 MG PO TABS
0.2000 mg | ORAL_TABLET | ORAL | Status: DC | PRN
Start: 2015-09-25 — End: 2015-09-27

## 2015-09-25 MED ORDER — FERROUS SULFATE 325 (65 FE) MG PO TABS
325.0000 mg | ORAL_TABLET | Freq: Two times a day (BID) | ORAL | Status: DC
  Administered 2015-09-25 – 2015-09-27 (×4): 325 mg via ORAL
  Filled 2015-09-25 (×4): qty 1

## 2015-09-25 MED ORDER — OXYCODONE-ACETAMINOPHEN 5-325 MG PO TABS: 2.0000 | ORAL_TABLET | ORAL | Status: DC | PRN

## 2015-09-25 MED ORDER — MEASLES, MUMPS & RUBELLA VAC ~~LOC~~ INJ
0.5000 mL | INJECTION | Freq: Once | SUBCUTANEOUS | Status: DC
  Filled 2015-09-25: qty 0.5

## 2015-09-25 MED ORDER — SIMETHICONE 80 MG PO CHEW: 80.0000 mg | CHEWABLE_TABLET | ORAL | Status: DC | PRN

## 2015-09-25 MED ORDER — SODIUM BICARBONATE 8.4 % IV SOLN
INTRAVENOUS | Status: DC | PRN
  Administered 2015-09-25: 5 mL via EPIDURAL

## 2015-09-25 MED ORDER — TETANUS-DIPHTH-ACELL PERTUSSIS 5-2.5-18.5 LF-MCG/0.5 IM SUSP: 0.5000 mL | Freq: Once | INTRAMUSCULAR | Status: DC

## 2015-09-25 NOTE — Anesthesia Procedure Notes (Signed)
Epidural Patient location during procedure: OB  Staffing Anesthesiologist: TURK, STEPHEN EDWARD Performed by: anesthesiologist   Preanesthetic Checklist Completed: patient identified, pre-op evaluation, timeout performed, IV checked, risks and benefits discussed and monitors and equipment checked  Epidural Patient position: sitting Prep: DuraPrep Patient monitoring: blood pressure and continuous pulse ox Approach: midline Location: L3-L4 Injection technique: LOR air  Needle:  Needle type: Tuohy  Needle gauge: 17 G Needle length: 9 cm Needle insertion depth: 7 cm Catheter size: 19 Gauge Catheter at skin depth: 12 cm Test dose: negative and Other (1% Lidocaine)  Additional Notes Patient identified.  Risk benefits discussed including failed block, incomplete pain control, headache, nerve damage, paralysis, blood pressure changes, nausea, vomiting, reactions to medication both toxic or allergic, and postpartum back pain.  Patient expressed understanding and wished to proceed.  All questions were answered.  Sterile technique used throughout procedure and epidural site dressed with sterile barrier dressing. No paresthesia or other complications noted. The patient did not experience any signs of intravascular injection such as tinnitus or metallic taste in mouth nor signs of intrathecal spread such as rapid motor block. Please see nursing notes for vital signs. Reason for block:procedure for pain   

## 2015-09-25 NOTE — Lactation Note (Signed)
This note was copied from the chart of Boy Wisdom Mesick. Lactation Consultation Note  Patient Name: Boy Delbert PhenixRhyanna Homann FAOZH'YToday's Date: 09/25/2015 Reason for consult: Initial assessment Partially bf mom that reports very sore nipples. Both nipples appear normal at this time, no skin break down noted. Mom also reports baby is having a hard time taking her milk from the spoon or drinking formula from a bottle. Baby appears to have a thick labial frenulum. He has a noticeable lingual frenulum. He can extend tongue >1cm past gum ridge and lift tongue to roof. Palette seems normal, maybe a tad bit high. He will latch with dimpled cheeks, maintain good tongue position for about 3 sucks, then pull tongue in, clicking can be heard, and he starts to bite down with gums. Mom refuses to latch baby at this time. Offered DEBP but she wants to stick with manual expression and the Harmony. She will manually express her milk at every feeding cue, offer her milk by spoon, use formula if needed, and use the Harmony for 15 minutes 8x/24hr. Given lactation handouts. She is aware of O/P lactation and support group.     Maternal Data Has patient been taught Hand Expression?: Yes Does the patient have breastfeeding experience prior to this delivery?: Yes  Feeding Feeding Type: Bottle Fed - Formula Nipple Type: Slow - flow  LATCH Score/Interventions                      Lactation Tools Discussed/Used WIC Program: No   Consult Status Consult Status: Follow-up Date: 09/26/15 Follow-up type: In-patient    Rulon Eisenmengerlizabeth E Shamyia Grandpre 09/25/2015, 7:18 PM

## 2015-09-25 NOTE — Anesthesia Preprocedure Evaluation (Addendum)
Anesthesia Evaluation  Patient identified by MRN, date of birth, ID band Patient awake    Reviewed: Allergy & Precautions, NPO status , Patient's Chart, lab work & pertinent test results  History of Anesthesia Complications Negative for: history of anesthetic complications  Airway Mallampati: II  TM Distance: >3 FB Neck ROM: Full    Dental  (+) Teeth Intact, Dental Advisory Given   Pulmonary asthma ,    Pulmonary exam normal breath sounds clear to auscultation       Cardiovascular negative cardio ROS Normal cardiovascular exam Rhythm:Regular Rate:Normal     Neuro/Psych PSYCHIATRIC DISORDERS Depression negative neurological ROS     GI/Hepatic Neg liver ROS, GERD  ,  Endo/Other  Obesity   Renal/GU negative Renal ROS     Musculoskeletal  (+) Arthritis , Osteoarthritis,    Abdominal   Peds  Hematology negative hematology ROS (+)   Anesthesia Other Findings Day of surgery medications reviewed with the patient.  Reproductive/Obstetrics                            Anesthesia Physical Anesthesia Plan  ASA: III  Anesthesia Plan: Epidural   Post-op Pain Management:    Induction:   Airway Management Planned:   Additional Equipment:   Intra-op Plan:   Post-operative Plan:   Informed Consent: I have reviewed the patients History and Physical, chart, labs and discussed the procedure including the risks, benefits and alternatives for the proposed anesthesia with the patient or authorized representative who has indicated his/her understanding and acceptance.   Dental advisory given  Plan Discussed with:   Anesthesia Plan Comments: (Patient identified. Risks/Benefits/Options discussed with patient including but not limited to bleeding, infection, nerve damage, paralysis, failed block, incomplete pain control, headache, blood pressure changes, nausea, vomiting, reactions to medication both  or allergic, itching and postpartum back pain. Confirmed with bedside nurse the patient's most recent platelet count. Confirmed with patient that they are not currently taking any anticoagulation, have any bleeding history or any family history of bleeding disorders. Patient expressed understanding and wished to proceed. All questions were answered. )        Anesthesia Quick Evaluation

## 2015-09-25 NOTE — Anesthesia Postprocedure Evaluation (Signed)
  Anesthesia Post-op Note  Patient: Chimene Moura  Procedure(s) Performed: * No procedures listed *  Patient Location: Mother/Baby  Anesthesia Type:Epidural  Level of Consciousness: awake, alert  and oriented  Airway and Oxygen Therapy: Patient Spontanous Breathing  Post-op Pain: none  Post-op Assessment: Post-op Vital signs reviewed, Patient's Cardiovascular Status Stable, Respiratory Function Stable, Patent Airway, No signs of Nausea or vomiting, Adequate PO intake, Pain level controlled, No headache, No backache and Patient able to bend at knees              Post-op Vital Signs: Reviewed and stable  Last Vitals:  Filed Vitals:   09/25/15 0738  BP: 129/78  Pulse: 81  Temp:   Resp:     Complications: No apparent anesthesia complications

## 2015-09-26 MED ORDER — ALBUTEROL SULFATE (2.5 MG/3ML) 0.083% IN NEBU: 3.0000 mL | INHALATION_SOLUTION | RESPIRATORY_TRACT | Status: DC | PRN

## 2015-09-26 MED ORDER — RHO D IMMUNE GLOBULIN 1500 UNIT/2ML IJ SOSY
300.0000 ug | PREFILLED_SYRINGE | Freq: Once | INTRAMUSCULAR | Status: AC
  Administered 2015-09-26: 300 ug via INTRAMUSCULAR
  Filled 2015-09-26: qty 2

## 2015-09-26 NOTE — Lactation Note (Signed)
This note was copied from the chart of Jasmine Jimena Elting. Lactation Consultation Note  Patient Name: Jasmine [name] ZOXWR'UToday's Date: 09/26/2015 Reason for consult: Follow-up assessment Mom requested help latching immediately following a frenotomy. Baby was able to grasp breast with tongue, as before, but was able to maintain good tongue position for the entire feeding. Mom is still reporting some nipple soreness. She thinks that he is bf much better. She will offer the breast on demand, she will stop bottles and pumping for right now. She might go home tomorrow and was instructed to f/u with lactation on Monday or Tues, she agreed.   Maternal Data    Feeding Feeding Type: Breast Fed Length of feed: 15 min  LATCH Score/Interventions Latch: Grasps breast easily, tongue down, lips flanged, rhythmical sucking. Intervention(s): Adjust position;Assist with latch  Audible Swallowing: Spontaneous and intermittent Intervention(s): Skin to skin  Type of Nipple: Everted at rest and after stimulation  Comfort (Breast/Nipple): Filling, red/small blisters or bruises, mild/mod discomfort  Problem noted: Mild/Moderate discomfort Interventions (Mild/moderate discomfort): Comfort gels  Hold (Positioning): Assistance needed to correctly position infant at breast and maintain latch. Intervention(s): Support Pillows;Position options  LATCH Score: 8  Lactation Tools Discussed/Used     Consult Status Consult Status: Follow-up Date: 09/27/15 Follow-up type: In-patient    Rulon Eisenmengerlizabeth E Linford Quintela 09/26/2015, 5:01 PM

## 2015-09-26 NOTE — Progress Notes (Signed)
Post Partum Day 1 Subjective:  Jasmine Maynard is a 24 y.o. [id number] 261w1d s/p NSVD.  No acute events overnight.  Pt denies problems with ambulating, voiding or po intake.  She denies nausea or vomiting.  Pain is well controlled.  She has had flatus.  Lochia Small.  Plan for birth control is IUD.  Method of Feeding: Breast, with formula supplementation  Objective: Blood pressure 114/82, pulse 90, temperature 99 F (37.2 C), temperature source Oral, resp. rate 18, last menstrual period 12/15/2014, unknown if currently breastfeeding.  Physical Exam:  General: alert, cooperative and no distress Lochia:normal flow Chest: normal WOB Heart: Regular rate Abdomen: soft, mild TTP (appropriate) Uterine Fundus: firm DVT Evaluation: No evidence of DVT seen on physical exam. Extremities: no edema   Recent Labs  09/24/15 1840  HGB 12.0  HCT 35.2*    Assessment/Plan:  ASSESSMENT: Jasmine Maynard is a 24 y.o. [id number] 321w1d s/p NSVD  Plan for discharge tomorrow and Lactation consult Continue routine PP care Breastfeeding support PRN   LOS: 2 days   Federico FlakeKimberly Niles Kitty Cadavid 09/26/2015, 11:07 AM

## 2015-09-27 LAB — RH IG WORKUP (INCLUDES ABO/RH)
ABO/RH(D): O NEG
Antibody Screen: POSITIVE
DAT, IGG: NEGATIVE
Fetal Screen: NEGATIVE
GESTATIONAL AGE(WKS): 40.1
UNIT DIVISION: 0

## 2015-09-27 MED ORDER — IBUPROFEN 600 MG PO TABS: 600.0000 mg | ORAL_TABLET | Freq: Four times a day (QID) | ORAL | Status: DC

## 2015-09-27 MED ORDER — GUAIFENESIN ER 600 MG PO TB12
600.0000 mg | ORAL_TABLET | Freq: Two times a day (BID) | ORAL | Status: DC
  Administered 2015-09-27: 600 mg via ORAL
  Filled 2015-09-27 (×3): qty 1

## 2015-09-27 NOTE — Discharge Summary (Signed)
OB Discharge Summary  Patient Name: Jasmine Maynard DOB: 1991-09-02 MRN: [id number]  Date of admission: 09/24/2015 Delivering MD: Christin Fudge   Date of discharge: 09/27/2015  Admitting diagnosis: 40WKS AMNISURE Intrauterine pregnancy: [redacted]w[redacted]d    Secondary diagnosis:Active Problems:   PROM (premature rupture of membranes)   NSVD (normal spontaneous vaginal delivery)  Additional problems: None     Discharge diagnosis: Term Pregnancy Delivered                                                                     Post partum procedures:rhogam, MMR, Tdap  Augmentation: Pitocin, Cytotec and Foley Balloon  Complications: None  Hospital course:  Onset of Labor With Vaginal Delivery     24y.o. yo [id number] admitted in Latent Laboron 09/24/2015. Patient had an uncomplicated labor course as follows:  Membrane Rupture Time/Date: 8:00 AM ,09/24/2015   Intrapartum Procedures: Episiotomy: None [1]                                         Lacerations:  None [1]  Patient had a delivery of a Viable infant. 09/25/2015  Information for the patient's newborn:  CoNaquita, Nappier0[[id number]]Delivery Method: VaRaylandad an uncomplicated postpartum course.  She is ambulating, tolerating a regular diet, passing flatus, and urinating well. Patient is discharged home in stable condition on No discharge date for patient encounter.. Marland Kitchen  Physical exam  Filed Vitals:   09/25/15 1735 09/26/15 0650 09/26/15 1800 09/27/15 0559  BP: 134/82 114/82 139/75 135/84  Pulse: 93 90 78 67  Temp: 98.7 F (37.1 C) 99 F (37.2 C) 98.5 F (36.9 C) 98 F (36.7 C)  TempSrc: Oral Oral Oral Oral  Resp: 18 18 18 20   SpO2:   98%    General: alert, cooperative and no distress Lochia: appropriate Uterine Fundus: firm Incision: N/A DVT Evaluation: No evidence of DVT seen on physical exam. Negative Homan's sign. No significant calf/ankle edema. Labs: Lab Results  Component  Value Date   WBC 9.2 09/24/2015   HGB 12.0 09/24/2015   HCT 35.2* 09/24/2015   MCV 81.7 09/24/2015   PLT 212 09/24/2015   CMP Latest Ref Rng 08/04/2015  Glucose 65 - 99 mg/dL 85  BUN 6 - 20 mg/dL 7  Creatinine 0.44 - 1.00 mg/dL 0.55  Sodium 135 - 145 mmol/L 133(L)  Potassium 3.5 - 5.1 mmol/L 3.8  Chloride 101 - 111 mmol/L 104  CO2 22 - 32 mmol/L 21(L)  Calcium 8.9 - 10.3 mg/dL 8.6(L)  Total Protein 6.5 - 8.1 g/dL 6.4(L)  Total Bilirubin 0.3 - 1.2 mg/dL 0.4  Alkaline Phos 38 - 126 U/L 70  AST 15 - 41 U/L 13(L)  ALT 14 - 54 U/L 11(L)    Discharge instruction: per After Visit Summary and "Baby and Me Booklet".  Medications: MEDICATIONS: See list below After Visit Meds:    Medication List    STOP taking these medications        acyclovir 400 MG tablet  Commonly known as:  ZOVIRAX     diphenhydrAMINE  25 MG tablet  Commonly known as:  SOMINEX     omeprazole 20 MG capsule  Commonly known as:  PRILOSEC     ondansetron 4 MG tablet  Commonly known as:  ZOFRAN      TAKE these medications        acetaminophen 325 MG tablet  Commonly known as:  TYLENOL  Take 650 mg by mouth every 6 (six) hours as needed for moderate pain or headache.     albuterol 108 (90 BASE) MCG/ACT inhaler  Commonly known as:  PROVENTIL HFA;VENTOLIN HFA  Inhale 2 puffs into the lungs every 6 (six) hours as needed for wheezing or shortness of breath.     ALLEGRA ALLERGY PO  Take 1 tablet by mouth daily.     cetirizine 10 MG tablet  Commonly known as:  ZYRTEC  Take 10 mg by mouth daily.     ibuprofen 600 MG tablet  Commonly known as:  ADVIL,MOTRIN  Take 1 tablet (600 mg total) by mouth every 6 (six) hours.     MYLANTA PO  Take 15 mLs by mouth daily as needed (heartburn).     prenatal vitamin w/FE, FA 27-1 MG Tabs tablet  Take 1 tablet by mouth daily at 12 noon.     pseudoephedrine 60 MG tablet  Commonly known as:  SUDAFED  Take 60 mg by mouth every 4 (four) hours as needed for congestion.         Diet: routine diet  Activity: Advance as tolerated. Pelvic rest for 6 weeks.   Outpatient follow up:6 weeks Follow up Appt: Future Appointments Date Time Provider [place]  09/28/2015 11:45 AM Roma Schanz, CNM [facility]  10/01/2015 6:30 AM WH-BSSCHED ROOM WH-BSSCHED None   Follow up visit: No Follow-up on file.  Postpartum contraception: IUD Mirena  Newborn Data: Live born female  Birth Weight: 8 lb 7.8 oz (3850 g) APGAR: 8, 10  Baby Feeding: Breast Disposition:home with mother   09/27/2015 Darci Needle, MD  [place], PGY-1

## 2015-09-27 NOTE — Lactation Note (Signed)
This note was copied from the chart of Jasmine Maynard. Lactation Consultation Note  Mother tried latching w/ #24NS.  In tears, too painful. Supported mother and assisted her w/ pumping.  Changed left flange to #27. Reviewed hand expression prior to pumping. FOB gave baby 1.5 ml of pumped breastmilk in syringe and then gave baby formula. Suggest syringe feeding for the remainder of today and then switching to slow flow nipple if still supplementing due to larger volume. Continue to attempt breastfeeding as nipples heal.   Outpatient appt on 11/11 at 10:30 am. [facility]   Patient Name: Jasmine Delbert PhenixRhyanna Wilbourn ZOXWR'UToday's Date: 09/27/2015 Reason for consult: Follow-up assessment   Maternal Data    Feeding Length of feed: 5 min  LATCH Score/Interventions                      Lactation Tools Discussed/Used     Consult Status Consult Status: Follow-up Date: 10/02/15 Follow-up type: In-patient    Dahlia ByesBerkelhammer, [facility] 09/27/2015, 12:47 PM

## 2015-09-27 NOTE — Discharge Instructions (Signed)

## 2015-09-27 NOTE — Lactation Note (Signed)
This note was copied from the chart of Jasmine Weslyn Viti. Lactation Consultation Note  Post frenotomy follow up. Demonstrated massage and exercises.  Provided parents with video website and information regarding exercises. Noted increased lateralization and lifting of tongue. Asked parents what is currently working for them with feeding their baby.  Appreciated Porfirio Mylararmen RN assistance last night. Mother is pumping with DEBP and receiving approx 7 ml and supplementing w/ formula sometimes in syringe and other times w/ bottle. Mother's nipples have abrasions on tips, pink and sore.  Mother is wearing comfort gels and alternating w/ shells.  She notes improvement. Suggest trying a #24NS and prefilling w/ pumped breastmilk.  Baby recently was fed but demonstrated how to apply and prefill. Attempted latching but baby sleepy at this time.  Parents seem comfortable trying at next feeding with NS. Left LC phone to call for assistance. Discussed WIC DEBP.  [facility] Reviewed engorgement care and monitoring voids/stools. Suggest making an OP appt if discharged today.  Parents will call LC if needed.   Patient Name: Jasmine Maynard Jasmine Maynard Reason for consult: Follow-up assessment   Maternal Data    Feeding Length of feed: 5 min  LATCH Score/Interventions                      Lactation Tools Discussed/Used     Consult Status Consult Status: Follow-up Date: 09/28/15 Follow-up type: In-patient    Dahlia ByesBerkelhammer, [facility] Maynard, 11:02 AM

## 2015-09-28 ENCOUNTER — Telehealth: Payer: Self-pay | Admitting: Obstetrics & Gynecology

## 2015-09-28 ENCOUNTER — Encounter: Payer: Medicaid Other | Admitting: Women's Health

## 2015-09-29 ENCOUNTER — Encounter: Payer: Self-pay | Admitting: Obstetrics and Gynecology

## 2015-09-29 ENCOUNTER — Ambulatory Visit (INDEPENDENT_AMBULATORY_CARE_PROVIDER_SITE_OTHER): Payer: Medicaid Other | Admitting: Obstetrics and Gynecology

## 2015-09-29 VITALS — BP 122/80 | Wt 209.0 lb

## 2015-09-29 DIAGNOSIS — K645 Perianal venous thrombosis: Secondary | ICD-10-CM | POA: Insufficient documentation

## 2015-09-29 MED ORDER — HYDROCORTISONE 2.5 % RE CREA: 1.0000 "application " | TOPICAL_CREAM | Freq: Two times a day (BID) | RECTAL | Status: DC

## 2015-09-29 MED ORDER — LIDOCAINE 5 % EX OINT: 1.0000 "application " | TOPICAL_OINTMENT | CUTANEOUS | Status: DC | PRN

## 2015-09-29 NOTE — Progress Notes (Signed)
Patient ID: Jasmine PhenixRhyanna Kiernan, female   DOB: 04/12/1991, 24 y.o.   MRN: [id number] Pt here today for a check on her hemorrhoids.

## 2015-09-29 NOTE — Progress Notes (Signed)
   Subjective:    Patient ID: Jasmine Maynard, female    DOB: 02/10/1991, 24 y.o.   MRN: [id number]  HPIs[p delivery with thrombosed hemorrhoid x2 , one is extreme, 3 cm others 1.5 cm or less    Review of Systems     Objective:   Physical Exam  Genitourinary:        See pic drawn. Posterior hemorrhoid I & D under local and cryotx, removing al large 1 cm clot. With relief obtained.     Assessment & Plan:  Thrombosed hemorrhoid, I&D done

## 2015-09-30 NOTE — Telephone Encounter (Signed)
Pt informed RX e-scribed.

## 2015-10-01 ENCOUNTER — Inpatient Hospital Stay (HOSPITAL_COMMUNITY): Admission: RE | Admit: 2015-10-01 | Payer: Medicaid Other | Source: Ambulatory Visit

## 2015-10-02 ENCOUNTER — Ambulatory Visit (HOSPITAL_COMMUNITY): Admit: 2015-10-02 | Payer: Medicaid Other

## 2015-10-05 ENCOUNTER — Ambulatory Visit: Payer: Medicaid Other | Admitting: Obstetrics and Gynecology

## 2015-10-07 ENCOUNTER — Ambulatory Visit (HOSPITAL_COMMUNITY): Payer: Medicaid Other

## 2015-10-14 ENCOUNTER — Telehealth: Payer: Self-pay | Admitting: Women's Health

## 2015-10-14 NOTE — Telephone Encounter (Signed)
Spoke with pt letting her know she needs to be seen and will need to go to her PCP or Urgent care since we will be closed for Thanksgiving. Pt voiced understanding. JSY

## 2015-10-14 NOTE — Telephone Encounter (Signed)
Spoke with pt. Pt thinks she has a sinus infection and is requesting an antibiotic. Please advise. Thanks!! JSY

## 2015-10-19 ENCOUNTER — Ambulatory Visit (INDEPENDENT_AMBULATORY_CARE_PROVIDER_SITE_OTHER): Payer: Medicaid Other | Admitting: Women's Health

## 2015-10-19 ENCOUNTER — Encounter: Payer: Self-pay | Admitting: Women's Health

## 2015-10-19 VITALS — BP 140/90 | HR 82 | Temp 98.6°F | Ht 64.0 in | Wt 204.5 lb

## 2015-10-19 DIAGNOSIS — O165 Unspecified maternal hypertension, complicating the puerperium: Secondary | ICD-10-CM | POA: Insufficient documentation

## 2015-10-19 DIAGNOSIS — B349 Viral infection, unspecified: Secondary | ICD-10-CM | POA: Diagnosis not present

## 2015-10-19 MED ORDER — HYDROCHLOROTHIAZIDE 25 MG PO TABS: 25.0000 mg | ORAL_TABLET | Freq: Every day | ORAL | Status: DC

## 2015-10-19 NOTE — Progress Notes (Signed)
Patient ID: Jasmine PhenixRhyanna Udell, female   DOB: 11/21/1990, 24 y.o.   MRN: [id number]   [facility]  Patient name: Jasmine PhenixRhyanna Marquard MRN [id number]  Date of birth: 04/27/1991  CC & HPI:  Jasmine PhenixRhyanna Fann is a 24 y.o. [id number] Caucasian female 3wks s/p SVB presenting today for report of cold sx x 2wks. Reports low grade temp last week, productive cough, runny nose- has been using sudafed- hasn't taken today, postnasal drip, headache, wheezing- she has been using her inhaler which helps. Denies body aches, earaches, sore throat. Called last week at end of day and was advised to go to urgent care as we were going to be closed for thanksgiving holiday- states she didn't go, just wanted to wait until today. Newborn and her daughter have had same sickness- they both went to [facility] and said just a virus. Pt states she feels like all of her sx are improving, but still has a lot of postnasal drip and headache. Denies visual changes, ruq/epigastric pain, n/v. BPs normal during pregnancy and delivery. Was breastfeeding but milk dried up, would like to try to resume if possible.  No LMP recorded.  Pertinent History Reviewed:  Medical & Surgical Hx:   Past Medical History  Diagnosis Date  . Herpes   . ADHD (attention deficit hyperactivity disorder)   . Perennial allergic rhinitis   . Vertigo   . Heartburn   . Pregnant 01/21/2015  . Asthma   . DDD (degenerative disc disease)   . Depression     fine problem   Past Surgical History  Procedure Laterality Date  . Intrauterine device insertion  2011  . Myringotomy  2011    right  . Cholecystectomy  02/13/2012    Procedure: LAPAROSCOPIC CHOLECYSTECTOMY;  Surgeon: Fabio BeringBrent C Ziegler, MD;  Location: [facility];  Service: General;  Laterality: N/A;   Medications: Reviewed & Updated - see associated section Social History: Reviewed -  reports that she has never smoked. She has never used smokeless tobacco.  Objective Findings:  Vitals: BP 140/90 mmHg  Pulse 82  Temp(Src)  98.6 F (37 C)  Ht 5\' 4"  (1.626 m)  Wt 204 lb 8 oz (92.761 kg)  BMI 35.09 kg/m2  Breastfeeding? No Body mass index is 35.09 kg/(m^2).  Physical Examination: General appearance - alert, well appearing, and in no distress Ears - bilateral TM's and external ear canals normal, Tube present in Rt ear Mouth - mucous membranes moist, pharynx normal without lesions Chest - clear to auscultation, no wheezes, rales or rhonchi, symmetric air entry Heart - normal rate and regular rhythm DTRs 2+, no clonus, no edema  No results found for this or any previous visit (from the past 24 hour(s)).   Assessment & Plan:  A:   3wks s/p SVB  Viral illness, improving  PP HTN  Bottlefeeding, would like to try to resume breastfeeding    P:  Rx hctz 25mg  daily, stop 2 day prior to pp visit  Gave printed viral illness relief measures, all sx improving- didn't feel like antibiotics warranted at this time- call if worsens, or not continuing to improve  Gave printed info on tips to increase milk supply  Return for change pp visit to 12/12 (cancel 12/27).  Marge DuncansBooker, Justice Aguirre Randall CNM, [facility]NP-BC 10/19/2015 12:43 PM

## 2015-10-19 NOTE — Patient Instructions (Addendum)
Stop taking the blood pressure medicine 2 days before your postpartum visit  You have a viral infection that will resolve on its own over time.  Symptoms typically last 3-7 days but can stretch out to 2-3 weeks.  Unfortunately, antibiotics are not helpful for viral infections.  Humidifier and saline nasal spray for nasal congestion  Regular robitussin, cough drops for cough  Warm salt water gargles for sore throat  Mucinex with lots of water to help you cough up the mucous in your chest if needed  Drink plenty of fluids and stay hydrated!  Wash your hands frequently.  Call if you are not improving by 7-10 days.    Tips To Increase Milk Supply  Lots of water! Enough so that your urine is clear  Plenty of calories, if you're not getting enough calories, your milk supply can decrease  Breastfeed/pump often, every 2-3 hours x 20-8130mins  Fenugreek 3 pills 3 times a day, this may make your urine smell like maple syrup  Mother's Milk Tea  Lactation cookies, google for the recipe  Real oatmeal

## 2015-11-02 ENCOUNTER — Ambulatory Visit: Payer: Medicaid Other | Admitting: Women's Health

## 2015-11-17 ENCOUNTER — Ambulatory Visit: Payer: Medicaid Other | Admitting: Women's Health

## 2015-11-22 NOTE — L&D Delivery Note (Signed)
Delivery Note At  a viable female was delivered via  (Presentation:ROA   ).  APGAR: as recorded, ; weight  .  As recorded Placenta status: L & D.  Cord: 3 vessel cord intact placenta by gentle traction.  Cord pH: NA  Anesthesia: Epidural and Local  Episiotomy:  No Lacerations:  right peri ureteral tear Suture Repair: 4/0 Monocryl Est. Blood Loss (mL):  300  Mom to postpartum.  Baby to Nursery.    TSVD without problems. 3 vessel cord intact placenta by gentle traction. Cord cut after 1 minute delay. Infant to abd. Small right peri ureteral laceration repaired. Mother and infant recovering in room together. All counts correct. Jasmine Maynard 09/03/2016, 2:06 PM

## 2016-01-26 ENCOUNTER — Encounter: Payer: Self-pay | Admitting: Adult Health

## 2016-01-26 ENCOUNTER — Ambulatory Visit (INDEPENDENT_AMBULATORY_CARE_PROVIDER_SITE_OTHER): Payer: Medicaid Other | Admitting: Adult Health

## 2016-01-26 VITALS — BP 130/70 | HR 82 | Ht 64.0 in | Wt 214.0 lb

## 2016-01-26 DIAGNOSIS — O3680X0 Pregnancy with inconclusive fetal viability, not applicable or unspecified: Secondary | ICD-10-CM

## 2016-01-26 DIAGNOSIS — R110 Nausea: Secondary | ICD-10-CM | POA: Diagnosis not present

## 2016-01-26 DIAGNOSIS — Z3490 Encounter for supervision of normal pregnancy, unspecified, unspecified trimester: Secondary | ICD-10-CM

## 2016-01-26 DIAGNOSIS — Z3201 Encounter for pregnancy test, result positive: Secondary | ICD-10-CM

## 2016-01-26 LAB — POCT URINE PREGNANCY: Preg Test, Ur: POSITIVE — AB

## 2016-01-26 NOTE — Progress Notes (Signed)
Subjective:     Patient ID: Jasmine Maynard, female   DOB: 06/08/1991, 25 y.o.   MRN: [id number]  HPI Jasmine Maynard is a 25 year old white female in for UPT, she has had 2 HPT +, and some nausea, no bleeding.She had a baby boy 09/25/15, and has  26 year old.She had swelling and elevated BP after delivery.   Review of Systems Patient denies any headaches, hearing loss, fatigue, blurred vision, shortness of breath, chest pain, abdominal pain, problems with bowel movements, urination, or intercourse. No joint pain or mood swings.See HPI for positives. Reviewed past medical,surgical, social and family history. Reviewed medications and allergies.     Objective:   Physical Exam BP 130/70 mmHg  Pulse 82  Ht 5\' 4"  (1.626 m)  Wt 214 lb (97.07 kg)  BMI 36.72 kg/m2  LMP 12/06/2015  Breastfeeding? No UPT +, about 7+2 weeks by LMP, with EDD 09/11/16.Medicaid form given, Skin warm and dry. Neck: mid line trachea, normal thyroid, good ROM, no lymphadenopathy noted. Lungs: clear to ausculation bilaterally. Cardiovascular: regular rate and rhythm.Abdomen soft and non tender.    Assessment:     +UPT Pregnant, G3P2    Plan:     Take prenatal vitamins has at home Apply for medicaid Return in 1 week for dating [place] Review handout on first trimester

## 2016-01-26 NOTE — Patient Instructions (Signed)
First Trimester of Pregnancy The first trimester of pregnancy is from week 1 until the end of week 12 (months 1 through 3). A week after a sperm fertilizes an egg, the egg will implant on the wall of the uterus. This embryo will begin to develop into a baby. Genes from you and your partner are forming the baby. The female genes determine whether the baby is a boy or a girl. At 6-8 weeks, the eyes and face are formed, and the heartbeat can be seen on ultrasound. At the end of 12 weeks, all the baby's organs are formed.  Now that you are pregnant, you will want to do everything you can to have a healthy baby. Two of the most important things are to get good prenatal care and to follow your health care provider's instructions. Prenatal care is all the medical care you receive before the baby's birth. This care will help prevent, find, and treat any problems during the pregnancy and childbirth. BODY CHANGES Your body goes through many changes during pregnancy. The changes vary from woman to woman.   You may gain or lose a couple of pounds at first.  You may feel sick to your stomach (nauseous) and throw up (vomit). If the vomiting is uncontrollable, call your health care provider.  You may tire easily.  You may develop headaches that can be relieved by medicines approved by your health care provider.  You may urinate more often. Painful urination may mean you have a bladder infection.  You may develop heartburn as a result of your pregnancy.  You may develop constipation because certain hormones are causing the muscles that push waste through your intestines to slow down.  You may develop hemorrhoids or swollen, bulging veins (varicose veins).  Your breasts may begin to grow larger and become tender. Your nipples may stick out more, and the tissue that surrounds them (areola) may become darker.  Your gums may bleed and may be sensitive to brushing and flossing.  Dark spots or blotches (chloasma,  mask of pregnancy) may develop on your face. This will likely fade after the baby is born.  Your menstrual periods will stop.  You may have a loss of appetite.  You may develop cravings for certain kinds of food.  You may have changes in your emotions from day to day, such as being excited to be pregnant or being concerned that something may go wrong with the pregnancy and baby.  You may have more vivid and strange dreams.  You may have changes in your hair. These can include thickening of your hair, rapid growth, and changes in texture. Some women also have hair loss during or after pregnancy, or hair that feels dry or thin. Your hair will most likely return to normal after your baby is born. WHAT TO EXPECT AT YOUR PRENATAL VISITS During a routine prenatal visit:  You will be weighed to make sure you and the baby are growing normally.  Your blood pressure will be taken.  Your abdomen will be measured to track your baby's growth.  The fetal heartbeat will be listened to starting around week 10 or 12 of your pregnancy.  Test results from any previous visits will be discussed. Your health care provider may ask you:  How you are feeling.  If you are feeling the baby move.  If you have had any abnormal symptoms, such as leaking fluid, bleeding, severe headaches, or abdominal cramping.  If you are using any tobacco products,   including cigarettes, chewing tobacco, and electronic cigarettes.  If you have any questions. Other tests that may be performed during your first trimester include:  Blood tests to find your blood type and to check for the presence of any previous infections. They will also be used to check for low iron levels (anemia) and Rh antibodies. Later in the pregnancy, blood tests for diabetes will be done along with other tests if problems develop.  Urine tests to check for infections, diabetes, or protein in the urine.  An ultrasound to confirm the proper growth  and development of the baby.  An amniocentesis to check for possible genetic problems.  Fetal screens for spina bifida and Down syndrome.  You may need other tests to make sure you and the baby are doing well.  HIV (human immunodeficiency virus) testing. Routine prenatal testing includes screening for HIV, unless you choose not to have this test. HOME CARE INSTRUCTIONS  Medicines  Follow your health care provider's instructions regarding medicine use. Specific medicines may be either safe or unsafe to take during pregnancy.  Take your prenatal vitamins as directed.  If you develop constipation, try taking a stool softener if your health care provider approves. Diet  Eat regular, well-balanced meals. Choose a variety of foods, such as meat or vegetable-based protein, fish, milk and low-fat dairy products, vegetables, fruits, and whole grain breads and cereals. Your health care provider will help you determine the amount of weight gain that is right for you.  Avoid raw meat and uncooked cheese. These carry germs that can cause birth defects in the baby.  Eating four or five small meals rather than three large meals a day may help relieve nausea and vomiting. If you start to feel nauseous, eating a few soda crackers can be helpful. Drinking liquids between meals instead of during meals also seems to help nausea and vomiting.  If you develop constipation, eat more high-fiber foods, such as fresh vegetables or fruit and whole grains. Drink enough fluids to keep your urine clear or pale yellow. Activity and Exercise  Exercise only as directed by your health care provider. Exercising will help you:  Control your weight.  Stay in shape.  Be prepared for labor and delivery.  Experiencing pain or cramping in the lower abdomen or low back is a good sign that you should stop exercising. Check with your health care provider before continuing normal exercises.  Try to avoid standing for long  periods of time. Move your legs often if you must stand in one place for a long time.  Avoid heavy lifting.  Wear low-heeled shoes, and practice good posture.  You may continue to have sex unless your health care provider directs you otherwise. Relief of Pain or Discomfort  Wear a good support bra for breast tenderness.   Take warm sitz baths to soothe any pain or discomfort caused by hemorrhoids. Use hemorrhoid cream if your health care provider approves.   Rest with your legs elevated if you have leg cramps or low back pain.  If you develop varicose veins in your legs, wear support hose. Elevate your feet for 15 minutes, 3-4 times a day. Limit salt in your diet. Prenatal Care  Schedule your prenatal visits by the twelfth week of pregnancy. They are usually scheduled monthly at first, then more often in the last 2 months before delivery.  Write down your questions. Take them to your prenatal visits.  Keep all your prenatal visits as directed by your   health care provider. Safety  Wear your seat belt at all times when driving.  Make a list of emergency phone numbers, including numbers for family, friends, the hospital, and police and fire departments. General Tips  Ask your health care provider for a referral to a local prenatal education class. Begin classes no later than at the beginning of month 6 of your pregnancy.  Ask for help if you have counseling or nutritional needs during pregnancy. Your health care provider can offer advice or refer you to specialists for help with various needs.  Do not use hot tubs, steam rooms, or saunas.  Do not douche or use tampons or scented sanitary pads.  Do not cross your legs for long periods of time.  Avoid cat litter boxes and soil used by cats. These carry germs that can cause birth defects in the baby and possibly loss of the fetus by miscarriage or stillbirth.  Avoid all smoking, herbs, alcohol, and medicines not prescribed by  your health care provider. Chemicals in these affect the formation and growth of the baby.  Do not use any tobacco products, including cigarettes, chewing tobacco, and electronic cigarettes. If you need help quitting, ask your health care provider. You may receive counseling support and other resources to help you quit.  Schedule a dentist appointment. At home, brush your teeth with a soft toothbrush and be gentle when you floss. SEEK MEDICAL CARE IF:   You have dizziness.  You have mild pelvic cramps, pelvic pressure, or nagging pain in the abdominal area.  You have persistent nausea, vomiting, or diarrhea.  You have a bad smelling vaginal discharge.  You have pain with urination.  You notice increased swelling in your face, hands, legs, or ankles. SEEK IMMEDIATE MEDICAL CARE IF:   You have a fever.  You are leaking fluid from your vagina.  You have spotting or bleeding from your vagina.  You have severe abdominal cramping or pain.  You have rapid weight gain or loss.  You vomit blood or material that looks like coffee grounds.  You are exposed to German measles and have never had them.  You are exposed to fifth disease or chickenpox.  You develop a severe headache.  You have shortness of breath.  You have any kind of trauma, such as from a fall or a car accident.   This information is not intended to replace advice given to you by your health care provider. Make sure you discuss any questions you have with your health care provider.   Document Released: 11/01/2001 Document Revised: 11/28/2014 Document Reviewed: 09/17/2013 Elsevier Interactive Patient Education 2016 [employer] Return in 1 week for dating [place]  

## 2016-02-01 ENCOUNTER — Ambulatory Visit (INDEPENDENT_AMBULATORY_CARE_PROVIDER_SITE_OTHER): Payer: Medicaid Other

## 2016-02-01 DIAGNOSIS — O3680X0 Pregnancy with inconclusive fetal viability, not applicable or unspecified: Secondary | ICD-10-CM | POA: Diagnosis not present

## 2016-02-01 NOTE — Progress Notes (Signed)
US 8+3wks single IUP w/ys,pos fht 159 bpm,normal ov's bilat,crl 20.871mm

## 2016-02-09 ENCOUNTER — Encounter: Payer: Self-pay | Admitting: Women's Health

## 2016-02-09 ENCOUNTER — Ambulatory Visit (INDEPENDENT_AMBULATORY_CARE_PROVIDER_SITE_OTHER): Payer: Medicaid Other | Admitting: Women's Health

## 2016-02-09 VITALS — BP 134/60 | HR 80 | Wt 214.0 lb

## 2016-02-09 DIAGNOSIS — Z369 Encounter for antenatal screening, unspecified: Secondary | ICD-10-CM

## 2016-02-09 DIAGNOSIS — Z6791 Unspecified blood type, Rh negative: Secondary | ICD-10-CM | POA: Insufficient documentation

## 2016-02-09 DIAGNOSIS — Z3490 Encounter for supervision of normal pregnancy, unspecified, unspecified trimester: Secondary | ICD-10-CM | POA: Insufficient documentation

## 2016-02-09 DIAGNOSIS — O360110 Maternal care for anti-D [Rh] antibodies, first trimester, not applicable or unspecified: Secondary | ICD-10-CM

## 2016-02-09 DIAGNOSIS — Z3491 Encounter for supervision of normal pregnancy, unspecified, first trimester: Secondary | ICD-10-CM | POA: Diagnosis not present

## 2016-02-09 DIAGNOSIS — O09899 Supervision of other high risk pregnancies, unspecified trimester: Secondary | ICD-10-CM | POA: Insufficient documentation

## 2016-02-09 DIAGNOSIS — O26899 Other specified pregnancy related conditions, unspecified trimester: Secondary | ICD-10-CM

## 2016-02-09 DIAGNOSIS — Z0283 Encounter for blood-alcohol and blood-drug test: Secondary | ICD-10-CM

## 2016-02-09 DIAGNOSIS — R768 Other specified abnormal immunological findings in serum: Secondary | ICD-10-CM

## 2016-02-09 DIAGNOSIS — Z1389 Encounter for screening for other disorder: Secondary | ICD-10-CM

## 2016-02-09 DIAGNOSIS — Z3682 Encounter for antenatal screening for nuchal translucency: Secondary | ICD-10-CM

## 2016-02-09 DIAGNOSIS — Z331 Pregnant state, incidental: Secondary | ICD-10-CM

## 2016-02-09 LAB — POCT URINALYSIS DIPSTICK
Blood, UA: NEGATIVE
GLUCOSE UA: NEGATIVE
Ketones, UA: NEGATIVE
LEUKOCYTES UA: NEGATIVE
NITRITE UA: NEGATIVE
Protein, UA: NEGATIVE

## 2016-02-09 NOTE — Patient Instructions (Signed)

## 2016-02-09 NOTE — Progress Notes (Signed)
Subjective:  Jasmine PhenixRhyanna Maynard is a 25 y.o. 883P2002 Caucasian female at 1660w4d by 8wk u/s, being seen today for her first obstetrical visit.  Her obstetrical history is significant for term uncomplicated svb x 2, short-interval pregnancy- last birth 09/25/15, had PP HTN after last baby, never came for pp visit.  Was not planning on pregnancy, was using condoms. Pregnancy history fully reviewed.  Patient reports some n/v- declines meds at present time. Denies vb, cramping, uti s/s, abnormal/malodorous vag d/c, or vulvovaginal itching/irritation.  BP 134/60 mmHg  Pulse 80  Wt 214 lb (97.07 kg)  LMP   HISTORY: OB History  Gravida Para Term Preterm AB SAB TAB Ectopic Multiple Living  3 2 2       0 2    # Outcome Date GA Lbr Len/2nd Weight Sex Delivery Anes PTL Lv  3 Current           2 Term 09/25/15 7243w1d 21:15 / 00:22 8 lb 7.8 oz (3.85 kg) M Vag-Spont EPI N Y  1 Term 08/14/09 7341w0d  7 lb 9 oz (3.43 kg) F Vag-Spont EPI N Y     Past Medical History  Diagnosis Date  . Herpes   . ADHD (attention deficit hyperactivity disorder)   . Perennial allergic rhinitis   . Vertigo   . Heartburn   . Pregnant 01/21/2015  . Asthma   . DDD (degenerative disc disease)   . Depression     fine problem  . Pregnant 01/26/2016   Past Surgical History  Procedure Laterality Date  . Intrauterine device insertion  2011  . Myringotomy  2011    right  . Cholecystectomy  02/13/2012    Procedure: LAPAROSCOPIC CHOLECYSTECTOMY;  Surgeon: Fabio BeringBrent C Ziegler, MD;  Location: [facility];  Service: General;  Laterality: N/A;   Family History  Problem Relation Age of Onset  . Diabetes Paternal Grandfather   . Heart attack Paternal Grandfather   . Other Paternal Grandfather     aneursym  . Diabetes Paternal Grandmother   . Cancer Paternal Grandmother     breast,lung  . Other Maternal Grandmother     heart issues; has stent; has a eating disorder  . COPD Father   . Other Father     heart issues  . COPD Mother   . Mental  illness Sister   . Autism Other     Exam   System:     General: Well developed & nourished, no acute distress   Skin: Warm & dry, normal coloration and turgor, no rashes   Neurologic: Alert & oriented, normal mood   Cardiovascular: Regular rate & rhythm   Respiratory: Effort & rate normal, LCTAB, acyanotic   Abdomen: Soft, non tender   Extremities: normal strength, tone  Thin prep pap smear neg 01/2015    Assessment:   Pregnancy: [id number] Patient Active Problem List   Diagnosis Date Noted  . Supervision of normal pregnancy 02/09/2016    Priority: High  . 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 Short interval between pregnancies affecting pregnancy, antepartum 02/09/2016  . Rh negative state in antepartum period 02/09/2016  . Postpartum hypertension 10/19/2015  . Thrombosed external hemorrhoids 09/29/2015  . Rh sensitization   . GERD (gastroesophageal reflux disease) 02/09/2015  . Constipation 02/09/2015  . Heartburn 01/21/2015  . Vertigo 01/21/2015  . 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  . Chronic low back pain 09/17/2013  . Depression with anxiety 02/11/2013  . ADHD (attention deficit hyperactivity disorder)  02/11/2013    [redacted]w[redacted]d G3P2002 New OB visit Short interval pregnancy Known HSV2 Rh neg H/O PP HTN  Plan:  Initial labs drawn Continue prenatal vitamins Problem list reviewed and updated Reviewed n/v relief measures and warning s/s to report Reviewed recommended weight gain based on pre-gravid BMI Encouraged well-balanced diet Genetic Screening discussed Integrated Screen: requested Cystic fibrosis screening discussed declined Ultrasound discussed; fetal survey: requested Follow up in 3 weeks for 1st IT/NT and visit [facility] completed Declines flu shot  Marge Duncans CNM, [facility] 02/09/2016 12:07 PM

## 2016-02-10 LAB — GC/CHLAMYDIA PROBE AMP
Chlamydia trachomatis, NAA: NEGATIVE
Neisseria gonorrhoeae by PCR: NEGATIVE

## 2016-02-10 LAB — CBC
HEMATOCRIT: 38.6 % (ref 34.0–46.6)
Hemoglobin: 13.4 g/dL (ref 11.1–15.9)
MCH: 29.2 pg (ref 26.6–33.0)
MCHC: 34.7 g/dL (ref 31.5–35.7)
MCV: 84 fL (ref 79–97)
Platelets: 229 10*3/uL (ref 150–379)
RBC: 4.59 x10E6/uL (ref 3.77–5.28)
RDW: 13.7 % (ref 12.3–15.4)
WBC: 8.5 10*3/uL (ref 3.4–10.8)

## 2016-02-10 LAB — RPR: RPR: NONREACTIVE

## 2016-02-10 LAB — ABO/RH: Rh Factor: NEGATIVE

## 2016-02-10 LAB — URINALYSIS, ROUTINE W REFLEX MICROSCOPIC
BILIRUBIN UA: NEGATIVE
GLUCOSE, UA: NEGATIVE
KETONES UA: NEGATIVE
LEUKOCYTES UA: NEGATIVE
Nitrite, UA: NEGATIVE
PROTEIN UA: NEGATIVE
RBC UA: NEGATIVE
SPEC GRAV UA: 1.016 (ref 1.005–1.030)
UUROB: 0.2 mg/dL (ref 0.2–1.0)
pH, UA: 7.5 (ref 5.0–7.5)

## 2016-02-10 LAB — ANTIBODY SCREEN: ANTIBODY SCREEN: NEGATIVE

## 2016-02-10 LAB — RUBELLA SCREEN: Rubella Antibodies, IGG: 2.73 index (ref >0.99)

## 2016-02-10 LAB — PMP SCREEN PROFILE (10S), URINE
Amphetamine Screen, Ur: NEGATIVE ng/mL
BARBITURATE SCRN UR: NEGATIVE ng/mL
BENZODIAZEPINE SCREEN, URINE: NEGATIVE ng/mL
CANNABINOIDS UR QL SCN: NEGATIVE ng/mL
Cocaine(Metab.)Screen, Urine: NEGATIVE ng/mL
Creatinine(Crt), U: 80.5 mg/dL (ref 20.0–300.0)
Methadone Scn, Ur: NEGATIVE ng/mL
OXYCODONE+OXYMORPHONE UR QL SCN: NEGATIVE ng/mL
Opiate Scrn, Ur: NEGATIVE ng/mL
PCP Scrn, Ur: NEGATIVE ng/mL
PH UR, DRUG SCRN: 7.7 (ref 4.5–8.9)
Propoxyphene, Screen: NEGATIVE ng/mL

## 2016-02-10 LAB — HEPATITIS B SURFACE ANTIGEN: HEP B S AG: NEGATIVE

## 2016-02-10 LAB — HIV ANTIBODY (ROUTINE TESTING W REFLEX): HIV Screen 4th Generation wRfx: NONREACTIVE

## 2016-02-10 LAB — VARICELLA ZOSTER ANTIBODY, IGG: VARICELLA: 3808 {index} (ref >165)

## 2016-02-11 LAB — URINE CULTURE

## 2016-02-24 ENCOUNTER — Telehealth: Payer: Self-pay | Admitting: Women's Health

## 2016-02-24 MED ORDER — OSELTAMIVIR PHOSPHATE 75 MG PO CAPS: 75.0000 mg | ORAL_CAPSULE | Freq: Two times a day (BID) | ORAL | Status: DC

## 2016-02-24 NOTE — Telephone Encounter (Signed)
Pt states that all of her family has the flu, they were given tamiflu but she has had symptoms for about 4 days. Pt was not given any, what does she need to do? I advised the pt that [facility] would [facility] and see what she suggested, Pt verbalized understanding.

## 2016-02-24 NOTE — Telephone Encounter (Signed)
Pt aware that Rx has been sent to her pharmacy.  

## 2016-03-01 ENCOUNTER — Ambulatory Visit (INDEPENDENT_AMBULATORY_CARE_PROVIDER_SITE_OTHER): Payer: Medicaid Other | Admitting: Adult Health

## 2016-03-01 ENCOUNTER — Ambulatory Visit (INDEPENDENT_AMBULATORY_CARE_PROVIDER_SITE_OTHER): Payer: Medicaid Other

## 2016-03-01 ENCOUNTER — Encounter: Payer: Self-pay | Admitting: Adult Health

## 2016-03-01 VITALS — BP 120/62 | HR 82 | Wt 217.0 lb

## 2016-03-01 DIAGNOSIS — Z369 Encounter for antenatal screening, unspecified: Secondary | ICD-10-CM

## 2016-03-01 DIAGNOSIS — Z3491 Encounter for supervision of normal pregnancy, unspecified, first trimester: Secondary | ICD-10-CM

## 2016-03-01 DIAGNOSIS — Z36 Encounter for antenatal screening of mother: Secondary | ICD-10-CM | POA: Diagnosis not present

## 2016-03-01 DIAGNOSIS — Z1389 Encounter for screening for other disorder: Secondary | ICD-10-CM

## 2016-03-01 DIAGNOSIS — Z3481 Encounter for supervision of other normal pregnancy, first trimester: Secondary | ICD-10-CM

## 2016-03-01 DIAGNOSIS — Z3682 Encounter for antenatal screening for nuchal translucency: Secondary | ICD-10-CM

## 2016-03-01 DIAGNOSIS — Z3A13 13 weeks gestation of pregnancy: Secondary | ICD-10-CM

## 2016-03-01 DIAGNOSIS — Z331 Pregnant state, incidental: Secondary | ICD-10-CM

## 2016-03-01 LAB — POCT URINALYSIS DIPSTICK
Glucose, UA: NEGATIVE
KETONES UA: NEGATIVE
NITRITE UA: NEGATIVE
Protein, UA: NEGATIVE

## 2016-03-01 NOTE — Patient Instructions (Signed)

## 2016-03-01 NOTE — Progress Notes (Signed)
[id number] 1427w4d Estimated Date of Delivery: 09/09/16  Blood pressure 120/62, pulse 82, weight 217 lb (98.431 kg), unknown if currently breastfeeding.   BP weight and urine results all reviewed and noted.  Please refer to the obstetrical flow sheet for the fundal height and fetal heart rate documentation:  Patient denies any bleeding and no rupture of membranes symptoms or regular contractions. Patient is without complaints. All questions were answered.  Orders Placed This Encounter  Procedures  . Maternal Screen, Integrated #1  . POCT Urinalysis Dipstick  Had IT/NT today  Plan:  Continued routine obstetrical care,  Return in about 4 weeks (around 03/29/2016) for second IT and see Drenda FreezeFran.

## 2016-03-01 NOTE — Progress Notes (Signed)
US 12+4 wks,measurements c/w dates,normal ov's bilat,crl 61.1 mm,NB present,NT 1.2 mm

## 2016-03-03 LAB — MATERNAL SCREEN, INTEGRATED #1
CROWN RUMP LENGTH MAT SCREEN: 61.1 mm
GEST. AGE ON COLLECTION DATE: 12.6 wk
MATERNAL AGE AT EDD: 26.6 a
NUMBER OF FETUSES: 1
Nuchal Translucency (NT): 1.2 mm
PAPP-A Value: 1277.8 ng/mL
WEIGHT: 217 [lb_av]

## 2016-03-12 ENCOUNTER — Encounter (HOSPITAL_COMMUNITY): Payer: Self-pay | Admitting: Emergency Medicine

## 2016-03-12 ENCOUNTER — Emergency Department (HOSPITAL_COMMUNITY)
Admission: EM | Admit: 2016-03-12 | Discharge: 2016-03-13 | Disposition: A | Discharge status: Home / Self Care | Payer: Medicaid Other | Attending: Emergency Medicine | Admitting: Emergency Medicine

## 2016-03-12 DIAGNOSIS — F329 Major depressive disorder, single episode, unspecified: Secondary | ICD-10-CM | POA: Insufficient documentation

## 2016-03-12 DIAGNOSIS — Z91040 Latex allergy status: Secondary | ICD-10-CM | POA: Insufficient documentation

## 2016-03-12 DIAGNOSIS — O26891 Other specified pregnancy related conditions, first trimester: Secondary | ICD-10-CM | POA: Insufficient documentation

## 2016-03-12 DIAGNOSIS — Z3A13 13 weeks gestation of pregnancy: Secondary | ICD-10-CM | POA: Diagnosis not present

## 2016-03-12 DIAGNOSIS — J45909 Unspecified asthma, uncomplicated: Secondary | ICD-10-CM | POA: Insufficient documentation

## 2016-03-12 DIAGNOSIS — Z3490 Encounter for supervision of normal pregnancy, unspecified, unspecified trimester: Secondary | ICD-10-CM

## 2016-03-12 DIAGNOSIS — Z79899 Other long term (current) drug therapy: Secondary | ICD-10-CM | POA: Insufficient documentation

## 2016-03-12 DIAGNOSIS — J018 Other acute sinusitis: Secondary | ICD-10-CM

## 2016-03-12 NOTE — ED Notes (Signed)
Patient has left sided sinus pressure and left ear pain.

## 2016-03-13 MED ORDER — OXYMETAZOLINE HCL 0.05 % NA SOLN
1.0000 | Freq: Once | NASAL | Status: AC
  Administered 2016-03-13: 1 via NASAL
  Filled 2016-03-13: qty 15

## 2016-03-13 MED ORDER — ACETAMINOPHEN 325 MG PO TABS
650.0000 mg | ORAL_TABLET | Freq: Once | ORAL | Status: AC
  Administered 2016-03-13: 650 mg via ORAL
  Filled 2016-03-13: qty 2

## 2016-03-13 MED ORDER — AZITHROMYCIN 250 MG PO TABS: ORAL_TABLET | ORAL | Status: DC

## 2016-03-13 MED ORDER — AZITHROMYCIN 250 MG PO TABS
500.0000 mg | ORAL_TABLET | Freq: Once | ORAL | Status: AC
  Administered 2016-03-13: 500 mg via ORAL
  Filled 2016-03-13: qty 2

## 2016-03-13 NOTE — Discharge Instructions (Signed)
Sinusitis, Adult Please increase fluids. Use 2 squirts of afrin in each nostril every 12 hours for 5 days only. Use one tablet of zithromax daily with food. Please see Dr Gerda DissLuking for additional evaluation if not improving. Sinusitis is redness, soreness, and puffiness (inflammation) of the air pockets in the bones of your face (sinuses). The redness, soreness, and puffiness can cause air and mucus to get trapped in your sinuses. This can allow germs to grow and cause an infection.  HOME CARE   Drink enough fluids to keep your pee (urine) clear or pale yellow.  Use a humidifier in your home.  Run a hot shower to create steam in the bathroom. Sit in the bathroom with the door closed. Breathe in the steam 3-4 times a day.  Put a warm, moist washcloth on your face 3-4 times a day, or as told by your doctor.  Use salt water sprays (saline sprays) to wet the thick fluid in your nose. This can help the sinuses drain.  Only take medicine as told by your doctor. GET HELP RIGHT AWAY IF:   Your pain gets worse.  You have very bad headaches.  You are sick to your stomach (nauseous).  You throw up (vomit).  You are very sleepy (drowsy) all the time.  Your face is puffy (swollen).  Your vision changes.  You have a stiff neck.  You have trouble breathing. MAKE SURE YOU:   Understand these instructions.  Will watch your condition.  Will get help right away if you are not doing well or get worse.   This information is not intended to replace advice given to you by your health care provider. Make sure you discuss any questions you have with your health care provider.   Document Released: 04/25/2008 Document Revised: 11/28/2014 Document Reviewed: 06/12/2012 Elsevier Interactive Patient Education [employer]2016 Elsevier Inc.

## 2016-03-13 NOTE — ED Provider Notes (Signed)
CSN: [id number]     Arrival date & time 03/12/16  2338 History   First MD Initiated Contact with Patient 03/12/16 2348     Chief Complaint  Patient presents with  . Facial Pain  . Otalgia     (Consider location/radiation/quality/duration/timing/severity/associated sxs/prior Treatment) HPI Comments: Patient is a [age] year old female who presents to the emergency department with a complaint of left-sided face and ear pain.  The patient states that she is about [redacted] weeks pregnant. She states that over the last 3 days she has been having increasing left face pain and now has left ear area pain. She has not had any high fever to be reported. She's not had any drainage from the left ear. She denies any known tooth related issue. She's not had injury or trauma to the left side of the face. She complains of mild to moderate headache. No vision changes, no difficulty with speech or movement of facial muscles.  The history is provided by the patient.    Past Medical History  Diagnosis Date  . Herpes   . ADHD (attention deficit hyperactivity disorder)   . Perennial allergic rhinitis   . Vertigo   . Heartburn   . Pregnant 01/21/2015  . Asthma   . DDD (degenerative disc disease)   . Depression     fine problem  . Pregnant 01/26/2016   Past Surgical History  Procedure Laterality Date  . Intrauterine device insertion  2011  . Myringotomy  2011    right  . Cholecystectomy  02/13/2012    Procedure: LAPAROSCOPIC CHOLECYSTECTOMY;  Surgeon: Fabio BeringBrent C Ziegler, MD;  Location: [facility];  Service: General;  Laterality: N/A;   Family History  Problem Relation Age of Onset  . Diabetes Paternal Grandfather   . Heart attack Paternal Grandfather   . Other Paternal Grandfather     aneursym  . Diabetes Paternal Grandmother   . Cancer Paternal Grandmother     breast,lung  . Other Maternal Grandmother     heart issues; has stent; has a eating disorder  . COPD Father   . Other Father     heart issues  . COPD  Mother   . Mental illness Sister   . Autism Other    Social History  Substance Use Topics  . Smoking status: Never Smoker   . Smokeless tobacco: Never Used  . Alcohol Use: No   OB History    Gravida Para Term Preterm AB TAB SAB Ectopic Multiple Living   3 2 2       0 2     Review of Systems  HENT: Positive for congestion, ear pain, postnasal drip and sinus pressure. Negative for ear discharge, hearing loss and mouth sores.   All other systems reviewed and are negative.     Allergies  Amoxicillin; Avelox; Cefdinir; Doxycycline; and Latex  Home Medications   Prior to Admission medications   Medication Sig Start Date End Date Taking? Authorizing Provider  Prenatal Vit-Fe Fumarate-FA (MULTIVITAMIN-PRENATAL) 27-0.8 MG TABS tablet Take 1 tablet by mouth daily at 12 noon.   Yes Historical Provider, MD  azithromycin (ZITHROMAX) 250 MG tablet 1 po daily with food 03/13/16   Ivery QualeHobson Marshal Eskew, PA-C   BP 131/72 mmHg  Pulse 86  Temp(Src) 98.4 F (36.9 C) (Oral)  Resp 16  Ht 5\' 4"  (1.626 m)  Wt 95.255 kg  BMI 36.03 kg/m2  SpO2 100%  LMP  Physical Exam  Constitutional: She is oriented to person, place, and time. She  appears well-developed and well-nourished.  Non-toxic appearance.  HENT:  Head: Normocephalic.  Right Ear: Tympanic membrane and external ear normal.  Left Ear: Tympanic membrane and external ear normal.  There is pain to percussion over the left frontal sinuses. The left face is not hot. Nasal congestion present.  There is no increased redness of the mastoid area, but some soreness present. The left external auditory canal is clear. There is minimal fluid behind the drum on the left, no bulging or excessive redness of the tympanic membrane.  The left upper posterior molar is at an angle. There is no increase redness or swelling about the gum. Doubt that this is the cause of the left sided facial pain.  Eyes: EOM and lids are normal. Pupils are equal, round, and reactive  to light.  Neck: Normal range of motion. Neck supple. Carotid bruit is not present.  Cardiovascular: Normal rate, regular rhythm, normal heart sounds, intact distal pulses and normal pulses.   Pulmonary/Chest: Breath sounds normal. No respiratory distress.  Abdominal: Soft. Bowel sounds are normal. There is no tenderness. There is no guarding.  Musculoskeletal: Normal range of motion.  Lymphadenopathy:       Head (right side): No submandibular adenopathy present.       Head (left side): No submandibular adenopathy present.    She has no cervical adenopathy.  Neurological: She is alert and oriented to person, place, and time. She has normal strength. No cranial nerve deficit or sensory deficit.  Skin: Skin is warm and dry.  Psychiatric: She has a normal mood and affect. Her speech is normal.  Nursing note and vitals reviewed.   ED Course  Procedures (including critical care time) Labs Review Labs Reviewed - No data to display  Imaging Review No results found. I have personally reviewed and evaluated these images and lab results as part of my medical decision-making.   EKG Interpretation None      MDM  Vital signs are well within normal limits.  Oximetry is 100% on room air. The examination favors sinusitis. The patient will be treated with Zithromax, 5 days of Afrin, and Tylenol extra strength. The patient is to follow-up with her primary physician if this issue is not resolving. Patient is in agreement with this discharge plan.    Final diagnoses:  Other acute sinusitis  Pregnancy    *I have reviewed nursing notes, vital signs, and all appropriate lab and imaging results for this patient.Ivery Quale, PA-C 03/13/16 8119  Shon Baton, MD 03/13/16 [phone]

## 2016-03-14 ENCOUNTER — Telehealth: Payer: Self-pay | Admitting: *Deleted

## 2016-03-14 NOTE — Telephone Encounter (Signed)
[facility] given Rx for Z-pack and Afrin Nasal Spray. Is it safe to use the Afrin nasal spray. Pt informed Cyril MourningJennifer Griffin, NP, can use the Afrin Nasal Spray recommends no more the twice daily due to rebound congestion. Pt verbalized understanding.

## 2016-03-18 ENCOUNTER — Encounter: Payer: Self-pay | Admitting: Obstetrics & Gynecology

## 2016-03-18 ENCOUNTER — Ambulatory Visit (INDEPENDENT_AMBULATORY_CARE_PROVIDER_SITE_OTHER): Payer: Medicaid Other | Admitting: Obstetrics & Gynecology

## 2016-03-18 VITALS — BP 120/60 | HR 88 | Temp 98.3°F | Wt 219.0 lb

## 2016-03-18 DIAGNOSIS — Z1389 Encounter for screening for other disorder: Secondary | ICD-10-CM

## 2016-03-18 DIAGNOSIS — J0101 Acute recurrent maxillary sinusitis: Secondary | ICD-10-CM

## 2016-03-18 DIAGNOSIS — Z331 Pregnant state, incidental: Secondary | ICD-10-CM

## 2016-03-18 LAB — POCT URINALYSIS DIPSTICK
Blood, UA: NEGATIVE
Glucose, UA: NEGATIVE
KETONES UA: NEGATIVE
LEUKOCYTES UA: NEGATIVE
Nitrite, UA: NEGATIVE
PROTEIN UA: NEGATIVE

## 2016-03-18 MED ORDER — FLUTICASONE PROPIONATE 50 MCG/ACT NA SUSP: 1.0000 | Freq: Every day | NASAL | Status: DC

## 2016-03-18 MED ORDER — AMOXICILLIN-POT CLAVULANATE 875-125 MG PO TABS: 1.0000 | ORAL_TABLET | Freq: Two times a day (BID) | ORAL | Status: DC

## 2016-03-18 NOTE — Progress Notes (Signed)
Work in ob: Pt seen in ED for siunsitis, given z pak without effect Has long history of sinus issues  +tenderness over maxiallry and ethmoid sinuses  She is not allergi to amoxicillin, says it does not work  Will use augmentin and flonase, she states augmentin works just hasn't taken it in a while  Meds ordered this encounter  Medications  . amoxicillin-clavulanate (AUGMENTIN) 875-125 MG tablet    Sig: Take 1 tablet by mouth 2 (two) times daily.    Dispense:  20 tablet    Refill:  0  . fluticasone (FLONASE) 50 MCG/ACT nasal spray    Sig: Place 1 spray into both nostrils daily.    Dispense:  16 g    Refill:  0      Face to face time:  10 minutes  Greater than 50% of the visit time was spent in counseling and coordination of care with the patient.  The summary and outline of the counseling and care coordination is summarized in the note above.   All questions were answered.

## 2016-03-30 ENCOUNTER — Ambulatory Visit (INDEPENDENT_AMBULATORY_CARE_PROVIDER_SITE_OTHER): Payer: Medicaid Other | Admitting: Advanced Practice Midwife

## 2016-03-30 VITALS — BP 114/60 | HR 72 | Wt 219.0 lb

## 2016-03-30 DIAGNOSIS — Z363 Encounter for antenatal screening for malformations: Secondary | ICD-10-CM

## 2016-03-30 DIAGNOSIS — Z3682 Encounter for antenatal screening for nuchal translucency: Secondary | ICD-10-CM

## 2016-03-30 DIAGNOSIS — Z3492 Encounter for supervision of normal pregnancy, unspecified, second trimester: Secondary | ICD-10-CM

## 2016-03-30 DIAGNOSIS — Z331 Pregnant state, incidental: Secondary | ICD-10-CM

## 2016-03-30 DIAGNOSIS — Z1389 Encounter for screening for other disorder: Secondary | ICD-10-CM

## 2016-03-30 LAB — POCT URINALYSIS DIPSTICK
GLUCOSE UA: NEGATIVE
Ketones, UA: NEGATIVE
Leukocytes, UA: NEGATIVE
NITRITE UA: NEGATIVE
Protein, UA: NEGATIVE
RBC UA: NEGATIVE

## 2016-03-30 NOTE — Progress Notes (Signed)
[id number] 9115w5d Estimated Date of Delivery: 09/09/16  Blood pressure 114/60, pulse 72, weight 219 lb (99.338 kg), unknown if currently breastfeeding.   BP weight and urine results all reviewed and noted.  Please refer to the obstetrical flow sheet for the fundal height and fetal heart rate documentation:  Patient reports good fetal movement, denies any bleeding and no rupture of membranes symptoms or regular contractions. Patient is without complaints. All questions were answered.  Orders Placed This Encounter  Procedures  . US OB Comp + 14 Wk  . Maternal Screen, Integrated #2  . POCT urinalysis dipstick    Plan:  Continued routine obstetrical care, 2nd IT  Return in about 3 weeks (around 04/20/2016) for JY:NWGNFAOS:Anatomy, LROB.

## 2016-04-01 LAB — MATERNAL SCREEN, INTEGRATED #2
ADSF: 1.11
AFP MARKER: 26.8 ng/mL
AFP MoM: 1.06
Crown Rump Length: 61.1 mm
DIA MOM: 0.78
DIA Value: 108.5 pg/mL
ESTRIOL UNCONJUGATED: 0.94 ng/mL
GESTATIONAL AGE: 16.7 wk
Gest. Age on Collection Date: 12.6 weeks
HCG MOM: 0.68
Maternal Age at EDD: 26.6 years
NUCHAL TRANSLUCENCY MOM: 0.77
NUMBER OF FETUSES: 1
Nuchal Translucency (NT): 1.2 mm
PAPP-A MoM: 2.05
PAPP-A Value: 1277.8 ng/mL
Test Results:: NEGATIVE
Weight: 217 [lb_av]
Weight: 219 [lb_av]
hCG Value: 16.1 IU/mL

## 2016-04-13 ENCOUNTER — Ambulatory Visit (INDEPENDENT_AMBULATORY_CARE_PROVIDER_SITE_OTHER): Payer: Medicaid Other | Admitting: Advanced Practice Midwife

## 2016-04-13 VITALS — BP 118/50 | HR 84 | Wt 223.0 lb

## 2016-04-13 DIAGNOSIS — Z1389 Encounter for screening for other disorder: Secondary | ICD-10-CM

## 2016-04-13 DIAGNOSIS — J069 Acute upper respiratory infection, unspecified: Secondary | ICD-10-CM

## 2016-04-13 DIAGNOSIS — Z3A19 19 weeks gestation of pregnancy: Secondary | ICD-10-CM

## 2016-04-13 DIAGNOSIS — O99512 Diseases of the respiratory system complicating pregnancy, second trimester: Secondary | ICD-10-CM

## 2016-04-13 DIAGNOSIS — Z331 Pregnant state, incidental: Secondary | ICD-10-CM

## 2016-04-13 LAB — POCT URINALYSIS DIPSTICK
Glucose, UA: NEGATIVE
KETONES UA: NEGATIVE
Leukocytes, UA: NEGATIVE
Nitrite, UA: NEGATIVE
PROTEIN UA: NEGATIVE
RBC UA: NEGATIVE

## 2016-04-13 MED ORDER — BENZONATATE 100 MG PO CAPS: 200.0000 mg | ORAL_CAPSULE | Freq: Three times a day (TID) | ORAL | Status: DC | PRN

## 2016-04-13 NOTE — Progress Notes (Signed)
WORK IN FOR COUGH/CONGESTION FOR 3 DAYS. Was wheezing yesteray. None today    G3P2002 8184w5d Estimated Date of Delivery: 09/09/16  Blood pressure 118/50, pulse 84, weight 223 lb (101.152 kg), unknown if currently breastfeeding.   BP weight and urine results all reviewed and noted.\Please refer to the obstetrical flow sheet for the fundal height and fetal heart rate documentation:  Patient  denies any bleeding and no rupture of membranes symptoms or regular contractions. LCTAB, sinuses non tender.  Non productive coughing in office All questions were answered.  Orders Placed This Encounter  Procedures  . POCT urinalysis dipstick    Plan:  Continued routine obstetrical care, Antibiotic guidelines given:  Call Friday if getting WORSE,  Has appt 5/31.   Meds ordered this encounter  Medications  . cetirizine (ZYRTEC) 10 MG tablet    Sig: Take 10 mg by mouth daily.  . benzonatate (TESSALON) 100 MG capsule    Sig: Take 2 capsules (200 mg total) by mouth 3 (three) times daily as needed for cough.    Dispense:  30 capsule    Refill:  0    Order Specific Question:  Supervising Provider    Answer:  Lazaro ArmsEURE, LUTHER H [[id number]]     Return for As scheduled.

## 2016-04-20 ENCOUNTER — Ambulatory Visit (INDEPENDENT_AMBULATORY_CARE_PROVIDER_SITE_OTHER): Payer: Medicaid Other | Admitting: Advanced Practice Midwife

## 2016-04-20 ENCOUNTER — Ambulatory Visit (INDEPENDENT_AMBULATORY_CARE_PROVIDER_SITE_OTHER): Payer: Medicaid Other

## 2016-04-20 VITALS — BP 128/78 | HR 78 | Wt 225.0 lb

## 2016-04-20 DIAGNOSIS — Z3492 Encounter for supervision of normal pregnancy, unspecified, second trimester: Secondary | ICD-10-CM

## 2016-04-20 DIAGNOSIS — Z363 Encounter for antenatal screening for malformations: Secondary | ICD-10-CM

## 2016-04-20 DIAGNOSIS — O321XX1 Maternal care for breech presentation, fetus 1: Secondary | ICD-10-CM

## 2016-04-20 DIAGNOSIS — Z1389 Encounter for screening for other disorder: Secondary | ICD-10-CM

## 2016-04-20 DIAGNOSIS — Z331 Pregnant state, incidental: Secondary | ICD-10-CM

## 2016-04-20 DIAGNOSIS — Z3A20 20 weeks gestation of pregnancy: Secondary | ICD-10-CM

## 2016-04-20 DIAGNOSIS — Z36 Encounter for antenatal screening of mother: Secondary | ICD-10-CM

## 2016-04-20 LAB — POCT URINALYSIS DIPSTICK
Glucose, UA: NEGATIVE
Ketones, UA: NEGATIVE
Leukocytes, UA: NEGATIVE
NITRITE UA: NEGATIVE
RBC UA: NEGATIVE

## 2016-04-20 NOTE — Progress Notes (Signed)
US 19+5 wks,breech,cx 4.8 cm,ant pl gr 0,svp of fluid 3.6 cm,normal ov's bilat,fhr 150 bpm,efw 302 g,limited view of spine,please have pt come back for additional images,no obvious abnormalities seen

## 2016-04-20 NOTE — Progress Notes (Signed)
[id number] 2041w5d Estimated Date of Delivery: 09/09/16  Blood pressure 128/78, pulse 78, weight 225 lb (102.059 kg), unknown if currently breastfeeding.   BP weight and urine results all reviewed and noted.  Please refer to the obstetrical flow sheet for the fundal height and fetal heart rate documentation:Anatomy scan today:  US 19+5 wks,breech,cx 4.8 cm,ant pl gr 0,svp of fluid 3.6 cm,normal ov's bilat,fhr 150 bpm,efw 302 g,limited view of spine,please have pt come back for additional images,no obvious abnormalities seen  Patient reports good fetal movement, denies any bleeding and no rupture of membranes symptoms or regular contractions. Patient is without complaints. Cough/congestion is much better. All questions were answered.  Orders Placed This Encounter  Procedures  . POCT urinalysis dipstick    Plan:  Continued routine obstetrical care,   Return in about 4 weeks (around 05/18/2016) for LROB.

## 2016-05-13 ENCOUNTER — Telehealth: Payer: Self-pay | Admitting: Obstetrics & Gynecology

## 2016-05-16 NOTE — Telephone Encounter (Signed)
Attempted to contact pt x 3. Telephone encounter closed.

## 2016-05-18 ENCOUNTER — Encounter: Payer: Medicaid Other | Admitting: Advanced Practice Midwife

## 2016-05-18 ENCOUNTER — Encounter: Payer: Self-pay | Admitting: *Deleted

## 2016-06-09 ENCOUNTER — Encounter: Payer: Self-pay | Admitting: Obstetrics and Gynecology

## 2016-06-09 ENCOUNTER — Ambulatory Visit (INDEPENDENT_AMBULATORY_CARE_PROVIDER_SITE_OTHER): Payer: Medicaid Other | Admitting: Obstetrics and Gynecology

## 2016-06-09 VITALS — BP 112/76 | HR 100 | Wt 233.0 lb

## 2016-06-09 DIAGNOSIS — Z3492 Encounter for supervision of normal pregnancy, unspecified, second trimester: Secondary | ICD-10-CM

## 2016-06-09 DIAGNOSIS — Z331 Pregnant state, incidental: Secondary | ICD-10-CM

## 2016-06-09 DIAGNOSIS — Z1389 Encounter for screening for other disorder: Secondary | ICD-10-CM

## 2016-06-09 LAB — POCT URINALYSIS DIPSTICK
Blood, UA: NEGATIVE
Glucose, UA: NEGATIVE
KETONES UA: NEGATIVE
LEUKOCYTES UA: NEGATIVE
NITRITE UA: NEGATIVE
PROTEIN UA: NEGATIVE

## 2016-06-09 NOTE — Progress Notes (Signed)
Pt denies any problems or concerns at this time.  

## 2016-06-09 NOTE — Progress Notes (Signed)
[id number] 2259w6d Estimated Date of Delivery: 09/09/16 LROB  Patient reports   good fetal movement, denies any bleeding and no rupture of membranes symptoms or regular contractions. Patient complaints:myalgias, but functioning normally.  Blood pressure 112/76, pulse 100, weight 233 lb (105.688 kg), unknown if currently breastfeeding.  refer to the ob flow sheet for FH and FHR, also BP, Wt, Urine results:notable for neg protein gluc                           Physical Examination: General appearance - alert, well appearing, and in no distress and overweight                                      Abdomen - FH 26.5 ,                                                         -FHR 138                                                         soft, nontender, nondistended, no masses or organomegaly                                      Pelvic - not done                                            Questions were answered. Assessment: LROB G3P2002 @ 2559w6d normal exam for GA                        Myalgias, family having a "virus" with rash on 2 babies, no GI sx.                        Plan:  Continued routine obstetrical care, u/s and GTT in 2 wk  F/u in 2  weeks for gtt, u/s

## 2016-06-22 ENCOUNTER — Other Ambulatory Visit: Payer: Self-pay | Admitting: Obstetrics and Gynecology

## 2016-06-22 DIAGNOSIS — IMO0002 Reserved for concepts with insufficient information to code with codable children: Secondary | ICD-10-CM

## 2016-06-22 DIAGNOSIS — Z0489 Encounter for examination and observation for other specified reasons: Secondary | ICD-10-CM

## 2016-06-23 ENCOUNTER — Ambulatory Visit (INDEPENDENT_AMBULATORY_CARE_PROVIDER_SITE_OTHER): Payer: Medicaid Other

## 2016-06-23 ENCOUNTER — Ambulatory Visit (INDEPENDENT_AMBULATORY_CARE_PROVIDER_SITE_OTHER): Payer: Medicaid Other | Admitting: Advanced Practice Midwife

## 2016-06-23 ENCOUNTER — Encounter: Payer: Medicaid Other | Admitting: Obstetrics & Gynecology

## 2016-06-23 ENCOUNTER — Other Ambulatory Visit: Payer: Medicaid Other

## 2016-06-23 VITALS — BP 112/70 | HR 86 | Wt 232.0 lb

## 2016-06-23 DIAGNOSIS — Z0489 Encounter for examination and observation for other specified reasons: Secondary | ICD-10-CM

## 2016-06-23 DIAGNOSIS — Z331 Pregnant state, incidental: Secondary | ICD-10-CM

## 2016-06-23 DIAGNOSIS — Z1389 Encounter for screening for other disorder: Secondary | ICD-10-CM

## 2016-06-23 DIAGNOSIS — Z3492 Encounter for supervision of normal pregnancy, unspecified, second trimester: Secondary | ICD-10-CM

## 2016-06-23 DIAGNOSIS — Z131 Encounter for screening for diabetes mellitus: Secondary | ICD-10-CM

## 2016-06-23 DIAGNOSIS — Z369 Encounter for antenatal screening, unspecified: Secondary | ICD-10-CM

## 2016-06-23 DIAGNOSIS — IMO0002 Reserved for concepts with insufficient information to code with codable children: Secondary | ICD-10-CM

## 2016-06-23 DIAGNOSIS — Z36 Encounter for antenatal screening of mother: Secondary | ICD-10-CM | POA: Diagnosis not present

## 2016-06-23 DIAGNOSIS — Z3493 Encounter for supervision of normal pregnancy, unspecified, third trimester: Secondary | ICD-10-CM

## 2016-06-23 DIAGNOSIS — Z3A29 29 weeks gestation of pregnancy: Secondary | ICD-10-CM

## 2016-06-23 DIAGNOSIS — Z3483 Encounter for supervision of other normal pregnancy, third trimester: Secondary | ICD-10-CM

## 2016-06-23 LAB — POCT URINALYSIS DIPSTICK
Glucose, UA: NEGATIVE
KETONES UA: NEGATIVE
LEUKOCYTES UA: NEGATIVE
NITRITE UA: NEGATIVE
RBC UA: NEGATIVE

## 2016-06-23 MED ORDER — AZITHROMYCIN 250 MG PO TABS: 250.0000 mg | ORAL_TABLET | Freq: Every day | ORAL | 0 refills | Status: DC

## 2016-06-23 NOTE — Patient Instructions (Signed)

## 2016-06-23 NOTE — Progress Notes (Signed)
[place] 28+6 wks,cephalic,cx 4.1 cm,normal ov's bilat,ant pl gr 1 ,afi 17 cm,fhr 142 bpm,efw 1343 g 56%,anatomy complete, no obvious abnormalities seen

## 2016-06-23 NOTE — Progress Notes (Signed)
S3M1962 [redacted]w[redacted]d Estimated Date of Delivery: 09/09/16  Blood pressure 112/70, pulse 86, weight 232 lb (105.2 kg), unknown if currently breastfeeding.   BP weight and urine results all reviewed and noted.  Please refer to the obstetrical flow sheet for the fundal height and fetal heart rate documentation: [place] 28+6 wks,cephalic,cx 4.1 cm,normal ov's bilat,ant pl gr 1 ,afi 17 cm,fhr 142 bpm,efw 1343 g 56%,anatomy complete, no obvious abnormalities seen Patient reports good fetal movement, denies any bleeding and no rupture of membranes symptoms or regular contractions. Patient is without complaints. All questions were answered.  Orders Placed This Encounter  Procedures  . POCT urinalysis dipstick    Plan:  Continued routine obstetrical care, PN2 today  Return in about 3 weeks (around 07/14/2016) for LROB.

## 2016-06-24 LAB — GLUCOSE TOLERANCE, 2 HOURS W/ 1HR
GLUCOSE, 2 HOUR: 67 mg/dL (ref 65–152)
Glucose, 1 hour: 101 mg/dL (ref 65–179)
Glucose, Fasting: 75 mg/dL (ref 65–91)

## 2016-06-24 LAB — ANTIBODY SCREEN: Antibody Screen: NEGATIVE

## 2016-06-24 LAB — CBC
HEMOGLOBIN: 11.9 g/dL (ref 11.1–15.9)
Hematocrit: 34.5 % (ref 34.0–46.6)
MCH: 29.2 pg (ref 26.6–33.0)
MCHC: 34.5 g/dL (ref 31.5–35.7)
MCV: 85 fL (ref 79–97)
PLATELETS: 256 10*3/uL (ref 150–379)
RBC: 4.08 x10E6/uL (ref 3.77–5.28)
RDW: 13.8 % (ref 12.3–15.4)
WBC: 9.7 10*3/uL (ref 3.4–10.8)

## 2016-06-24 LAB — RPR: RPR: NONREACTIVE

## 2016-06-24 LAB — HIV ANTIBODY (ROUTINE TESTING W REFLEX): HIV SCREEN 4TH GENERATION: NONREACTIVE

## 2016-07-13 ENCOUNTER — Encounter: Payer: Medicaid Other | Admitting: Advanced Practice Midwife

## 2016-08-10 ENCOUNTER — Ambulatory Visit (INDEPENDENT_AMBULATORY_CARE_PROVIDER_SITE_OTHER): Payer: Medicaid Other | Admitting: Advanced Practice Midwife

## 2016-08-10 ENCOUNTER — Inpatient Hospital Stay (HOSPITAL_COMMUNITY)
Admission: AD | Admit: 2016-08-10 | Discharge: 2016-08-10 | Disposition: A | Discharge status: Home / Self Care | Payer: Medicaid Other | Source: Ambulatory Visit | Attending: Obstetrics and Gynecology | Admitting: Obstetrics and Gynecology

## 2016-08-10 ENCOUNTER — Encounter (HOSPITAL_COMMUNITY): Payer: Self-pay

## 2016-08-10 ENCOUNTER — Encounter: Payer: Self-pay | Admitting: Advanced Practice Midwife

## 2016-08-10 VITALS — BP 144/90 | HR 90 | Wt 241.0 lb

## 2016-08-10 DIAGNOSIS — B009 Herpesviral infection, unspecified: Secondary | ICD-10-CM | POA: Diagnosis not present

## 2016-08-10 DIAGNOSIS — O163 Unspecified maternal hypertension, third trimester: Secondary | ICD-10-CM | POA: Diagnosis present

## 2016-08-10 DIAGNOSIS — O98513 Other viral diseases complicating pregnancy, third trimester: Secondary | ICD-10-CM | POA: Insufficient documentation

## 2016-08-10 DIAGNOSIS — F329 Major depressive disorder, single episode, unspecified: Secondary | ICD-10-CM | POA: Diagnosis not present

## 2016-08-10 DIAGNOSIS — Z3A35 35 weeks gestation of pregnancy: Secondary | ICD-10-CM | POA: Diagnosis not present

## 2016-08-10 DIAGNOSIS — Z3493 Encounter for supervision of normal pregnancy, unspecified, third trimester: Secondary | ICD-10-CM

## 2016-08-10 DIAGNOSIS — F901 Attention-deficit hyperactivity disorder, predominantly hyperactive type: Secondary | ICD-10-CM | POA: Diagnosis not present

## 2016-08-10 DIAGNOSIS — O99513 Diseases of the respiratory system complicating pregnancy, third trimester: Secondary | ICD-10-CM | POA: Diagnosis not present

## 2016-08-10 DIAGNOSIS — R51 Headache: Secondary | ICD-10-CM | POA: Diagnosis not present

## 2016-08-10 DIAGNOSIS — Z3A36 36 weeks gestation of pregnancy: Secondary | ICD-10-CM

## 2016-08-10 DIAGNOSIS — J45909 Unspecified asthma, uncomplicated: Secondary | ICD-10-CM | POA: Diagnosis not present

## 2016-08-10 DIAGNOSIS — J3489 Other specified disorders of nose and nasal sinuses: Secondary | ICD-10-CM | POA: Diagnosis not present

## 2016-08-10 DIAGNOSIS — O9989 Other specified diseases and conditions complicating pregnancy, childbirth and the puerperium: Secondary | ICD-10-CM | POA: Insufficient documentation

## 2016-08-10 DIAGNOSIS — R030 Elevated blood-pressure reading, without diagnosis of hypertension: Secondary | ICD-10-CM | POA: Diagnosis not present

## 2016-08-10 DIAGNOSIS — I10 Essential (primary) hypertension: Secondary | ICD-10-CM | POA: Diagnosis present

## 2016-08-10 DIAGNOSIS — IMO0001 Reserved for inherently not codable concepts without codable children: Secondary | ICD-10-CM

## 2016-08-10 DIAGNOSIS — Z3483 Encounter for supervision of other normal pregnancy, third trimester: Secondary | ICD-10-CM

## 2016-08-10 DIAGNOSIS — Z1389 Encounter for screening for other disorder: Secondary | ICD-10-CM

## 2016-08-10 DIAGNOSIS — O99343 Other mental disorders complicating pregnancy, third trimester: Secondary | ICD-10-CM | POA: Insufficient documentation

## 2016-08-10 DIAGNOSIS — O360130 Maternal care for anti-D [Rh] antibodies, third trimester, not applicable or unspecified: Secondary | ICD-10-CM

## 2016-08-10 DIAGNOSIS — R519 Headache, unspecified: Secondary | ICD-10-CM

## 2016-08-10 DIAGNOSIS — Z331 Pregnant state, incidental: Secondary | ICD-10-CM

## 2016-08-10 LAB — COMPREHENSIVE METABOLIC PANEL
ALBUMIN: 2.9 g/dL — AB (ref 3.5–5.0)
ALK PHOS: 71 U/L (ref 38–126)
ALT: 12 U/L — ABNORMAL LOW (ref 14–54)
AST: 18 U/L (ref 15–41)
Anion gap: 9 (ref 5–15)
BILIRUBIN TOTAL: 0.8 mg/dL (ref 0.3–1.2)
BUN: 6 mg/dL (ref 6–20)
CALCIUM: 8.3 mg/dL — AB (ref 8.9–10.3)
CO2: 19 mmol/L — AB (ref 22–32)
Chloride: 107 mmol/L (ref 101–111)
Creatinine, Ser: 0.51 mg/dL (ref 0.44–1.00)
GFR calc Af Amer: >60 mL/min (ref >60)
GFR calc non Af Amer: >60 mL/min (ref >60)
GLUCOSE: 141 mg/dL — AB (ref 65–99)
Potassium: 3.2 mmol/L — ABNORMAL LOW (ref 3.5–5.1)
Sodium: 135 mmol/L (ref 135–145)
TOTAL PROTEIN: 6.9 g/dL (ref 6.5–8.1)

## 2016-08-10 LAB — URINE MICROSCOPIC-ADD ON

## 2016-08-10 LAB — POCT URINALYSIS DIPSTICK
Glucose, UA: NEGATIVE
KETONES UA: NEGATIVE
Nitrite, UA: NEGATIVE
PROTEIN UA: NEGATIVE
RBC UA: NEGATIVE

## 2016-08-10 LAB — URINALYSIS, ROUTINE W REFLEX MICROSCOPIC
BILIRUBIN URINE: NEGATIVE
GLUCOSE, UA: NEGATIVE mg/dL
HGB URINE DIPSTICK: NEGATIVE
KETONES UR: 15 mg/dL — AB
Nitrite: NEGATIVE
PH: 6.5 (ref 5.0–8.0)
PROTEIN: NEGATIVE mg/dL
Specific Gravity, Urine: 1.015 (ref 1.005–1.030)

## 2016-08-10 LAB — CBC
HEMATOCRIT: 31.7 % — AB (ref 36.0–46.0)
HEMOGLOBIN: 10.8 g/dL — AB (ref 12.0–15.0)
MCH: 27.3 pg (ref 26.0–34.0)
MCHC: 34.1 g/dL (ref 30.0–36.0)
MCV: 80.3 fL (ref 78.0–100.0)
Platelets: 211 10*3/uL (ref 150–400)
RBC: 3.95 MIL/uL (ref 3.87–5.11)
RDW: 13.3 % (ref 11.5–15.5)
WBC: 8.6 10*3/uL (ref 4.0–10.5)

## 2016-08-10 LAB — PROTEIN / CREATININE RATIO, URINE
Creatinine, Urine: 114 mg/dL
Protein Creatinine Ratio: 0.15 mg/mg{Cre} (ref 0.00–0.15)
Total Protein, Urine: 17 mg/dL

## 2016-08-10 MED ORDER — BUTALBITAL-APAP-CAFFEINE 50-325-40 MG PO TABS: 1.0000 | ORAL_TABLET | Freq: Four times a day (QID) | ORAL | 0 refills | Status: DC | PRN

## 2016-08-10 MED ORDER — ACYCLOVIR 400 MG PO TABS: 400.0000 mg | ORAL_TABLET | Freq: Three times a day (TID) | ORAL | 2 refills | Status: DC

## 2016-08-10 MED ORDER — RHO D IMMUNE GLOBULIN 1500 UNIT/2ML IJ SOSY
300.0000 ug | PREFILLED_SYRINGE | Freq: Once | INTRAMUSCULAR | Status: AC
  Administered 2016-08-10: 300 ug via INTRAMUSCULAR

## 2016-08-10 MED ORDER — CYCLOBENZAPRINE HCL 10 MG PO TABS: 10.0000 mg | ORAL_TABLET | Freq: Two times a day (BID) | ORAL | 0 refills | Status: DC | PRN

## 2016-08-10 NOTE — MAU Note (Signed)
Pt was at MD office earlier today and her blood pressure was high. Pt had a headache at that time and was told to try Tylenol. Pt took Tylenol about 1 1/2 hours ago and her headache is persistant. Pt states she sometime has blurred vision but none today. Pt states she has some pain under her right breast but she has that pain periodically due to having her gall bladder removed. Pt states the baby is moving normally. Pt denies contractions, leaking, and bleeding.

## 2016-08-10 NOTE — Progress Notes (Signed)
[id number] 754w5d Estimated Date of Delivery: 09/09/16  Blood pressure (!) 144/90, pulse 90, weight 241 lb (109.3 kg), unknown if currently breastfeeding.   BP weight and urine results all reviewed and noted.Some off and on vulvar irritation. Sweats a whole lot.  No rash/SSE: normal vagina. Try Desitin Please refer to the obstetrical flow sheet for the fundal height and fetal heart rate documentation:  Patient reports good fetal movement, denies any bleeding and no rupture of membranes symptoms or regular contractions. Patient endorses HA, hasn't taken anything for it.   Also has RUQ pain, but has had that intermittently since having GB out.   All questions were answered.  Orders Placed This Encounter  Procedures  . CBC  . Comprehensive metabolic panel  . Protein, urine, 24 hour  . POCT urinalysis dipstick    Plan:  Take Tylenol  If it doesn't help HA, go ahead and go to MAU for GHTN/PreX work up.  Otherwise,  Return in about 2 days (around 08/12/2016) for LROB BP check.

## 2016-08-10 NOTE — Discharge Instructions (Signed)

## 2016-08-10 NOTE — MAU Provider Note (Signed)
History     CSN: [id number]  Arrival date and time: 08/10/16 1539   First Provider Initiated Contact with Patient 08/10/16 1642      Chief Complaint  Patient presents with  . Hypertension  . Headache   HPI   Jasmine Maynard is a 25 y.o. female [id number] @ 6219w5d here with HA and elevated BP readings in the office. [facility] and noted to them that she had a HA that has been off and on for 2 weeks. She went home and around 1230 took 1 gram of tylenol which did not subside her HA pain. The HA is described as pressure behind both eyes. She has no history of HA. Her HA pain is rated at a 4/10 at this time.  She had elevated BP with her last pregnancy in the post partum period. Never started on medication.  She denies URI symptoms.   OB History    Gravida Para Term Preterm AB Living   3 2 2     2    SAB TAB Ectopic Multiple Live Births         0 2      Past Medical History:  Diagnosis Date  . ADHD (attention deficit hyperactivity disorder)   . Asthma   . DDD (degenerative disc disease)   . Depression    fine problem  . Heartburn   . Herpes   . Perennial allergic rhinitis   . Pregnant 01/21/2015  . Pregnant 01/26/2016  . Vertigo     Past Surgical History:  Procedure Laterality Date  . CHOLECYSTECTOMY  02/13/2012   Procedure: LAPAROSCOPIC CHOLECYSTECTOMY;  Surgeon: Fabio BeringBrent C Ziegler, MD;  Location: [facility];  Service: General;  Laterality: N/A;  . INTRAUTERINE DEVICE INSERTION  2011  . MYRINGOTOMY  2011   right    Family History  Problem Relation Age of Onset  . Diabetes Paternal Grandfather   . Heart attack Paternal Grandfather   . Other Paternal Grandfather     aneursym  . Diabetes Paternal Grandmother   . Cancer Paternal Grandmother     breast,lung  . Other Maternal Grandmother     heart issues; has stent; has a eating disorder  . COPD Father   . Other Father     heart issues  . COPD Mother   . Mental illness Sister   . Autism Other      Social History  Substance Use Topics  . Smoking status: Never Smoker  . Smokeless tobacco: Never Used  . Alcohol use No    Allergies:  Allergies  Allergen Reactions  . Amoxicillin     Reaction: INEFFECTIVE  . Avelox [Moxifloxacin Hcl In Nacl] Diarrhea and Nausea And Vomiting  . Cefdinir     ineffective  . Doxycycline Nausea And Vomiting  . Latex Rash    Prescriptions Prior to Admission  Medication Sig Dispense Refill Last Dose  . acyclovir (ZOVIRAX) 400 MG tablet Take 1 tablet (400 mg total) by mouth 3 (three) times daily. 90 tablet 2   . azithromycin (ZITHROMAX) 250 MG tablet Take 1 tablet (250 mg total) by mouth daily. Take 2 today, the 1 a day for 4 days (Patient not taking: Reported on 08/10/2016) 6 tablet 0 Not Taking  . cetirizine (ZYRTEC) 10 MG tablet Take 10 mg by mouth daily.   Taking  . fluticasone (FLONASE) 50 MCG/ACT nasal spray Place 1 spray into both nostrils daily. 16 g 0 Taking  . Prenatal Vit-Fe  Fumarate-FA (MULTIVITAMIN-PRENATAL) 27-0.8 MG TABS tablet Take 1 tablet by mouth daily at 12 noon.   Taking   Results for orders placed or performed during the hospital encounter of 08/10/16 (from the past 24 hour(s))  Urinalysis, Routine w reflex microscopic (not at [facility])     Status: Abnormal   Collection Time: 08/10/16  3:45 PM  Result Value Ref Range   Color, Urine YELLOW YELLOW   APPearance CLEAR CLEAR   Specific Gravity, Urine 1.015 1.005 - 1.030   pH 6.5 5.0 - 8.0   Glucose, UA NEGATIVE NEGATIVE mg/dL   Hgb urine dipstick NEGATIVE NEGATIVE   Bilirubin Urine NEGATIVE NEGATIVE   Ketones, ur 15 (A) NEGATIVE mg/dL   Protein, ur NEGATIVE NEGATIVE mg/dL   Nitrite NEGATIVE NEGATIVE   Leukocytes, UA TRACE (A) NEGATIVE  Protein / creatinine ratio, urine     Status: None   Collection Time: 08/10/16  3:45 PM  Result Value Ref Range   Creatinine, Urine 114.00 mg/dL   Total Protein, Urine 17 mg/dL   Protein Creatinine Ratio 0.15 0.00 - 0.15 mg/mg[Cre]  Urine  microscopic-add on     Status: Abnormal   Collection Time: 08/10/16  3:45 PM  Result Value Ref Range   Squamous Epithelial / LPF 0-5 (A) NONE SEEN   WBC, UA 0-5 0 - 5 WBC/hpf   RBC / HPF 0-5 0 - 5 RBC/hpf   Bacteria, UA RARE (A) NONE SEEN  CBC     Status: Abnormal   Collection Time: 08/10/16  4:21 PM  Result Value Ref Range   WBC 8.6 4.0 - 10.5 K/uL   RBC 3.95 3.87 - 5.11 MIL/uL   Hemoglobin 10.8 (L) 12.0 - 15.0 g/dL   HCT 16.1 (L) 09.6 - 04.5 %   MCV 80.3 78.0 - 100.0 fL   MCH 27.3 26.0 - 34.0 pg   MCHC 34.1 30.0 - 36.0 g/dL   RDW 40.9 81.1 - 91.4 %   Platelets 211 150 - 400 K/uL  Comprehensive metabolic panel     Status: Abnormal   Collection Time: 08/10/16  4:21 PM  Result Value Ref Range   Sodium 135 135 - 145 mmol/L   Potassium 3.2 (L) 3.5 - 5.1 mmol/L   Chloride 107 101 - 111 mmol/L   CO2 19 (L) 22 - 32 mmol/L   Glucose, Bld 141 (H) 65 - 99 mg/dL   BUN 6 6 - 20 mg/dL   Creatinine, Ser 7.82 0.44 - 1.00 mg/dL   Calcium 8.3 (L) 8.9 - 10.3 mg/dL   Total Protein 6.9 6.5 - 8.1 g/dL   Albumin 2.9 (L) 3.5 - 5.0 g/dL   AST 18 15 - 41 U/L   ALT 12 (L) 14 - 54 U/L   Alkaline Phosphatase 71 38 - 126 U/L   Total Bilirubin 0.8 0.3 - 1.2 mg/dL   GFR calc non Af Amer >60 >60 mL/min   GFR calc Af Amer >60 >60 mL/min   Anion gap 9 5 - 15   Review of Systems  Eyes: Positive for blurred vision (spots in vision now for a couple of weeks.).  Neurological: Positive for headaches.   Physical Exam   Blood pressure (!) 87/44, pulse 107, temperature 98.6 F (37 C), temperature source Oral, resp. rate 18, height 5\' 4"  (1.626 m), weight 242 lb (109.8 kg), SpO2 100 %, unknown if currently breastfeeding.   BP (!) 87/44   Pulse 107   Temp 98.6 F (37 C) (Oral)  Resp 18   Ht 5\' 4"  (1.626 m)   Wt 242 lb (109.8 kg)   SpO2 100%   BMI 41.54 kg/m    Patient Vitals for the past 24 hrs:  BP Temp Temp src Pulse Resp SpO2 Height Weight  08/10/16 1755 125/70 - - 95 - - - -  08/10/16 1754  125/70 - - - - - - -  08/10/16 1717 - - - 104 - - - -  08/10/16 1716 131/67 - - 104 - 98 % - -  08/10/16 1705 - - - 118 - 97 % - -  08/10/16 1701 129/60 - - 109 - - - -  08/10/16 1647 - - - 109 - 93 % - -  08/10/16 1646 114/82 - - (!) 156 - - - -  08/10/16 1637 - - - 107 - 100 % - -  08/10/16 1634 - - - 106 - 99 % - -  08/10/16 1633 - - - 107 - 100 % - -  08/10/16 1632 (!) 87/44 - - 106 - - - -  08/10/16 1630 - - - 106 - 100 % - -  08/10/16 1624 - - - 113 - 100 % - -  08/10/16 1619 - - - 107 - 100 % - -  08/10/16 1616 132/84 - - 104 - - - -  08/10/16 1614 - - - 105 - 100 % - -  08/10/16 1609 - - - 101 - 100 % - -  08/10/16 1604 - - - 108 - 99 % - -  08/10/16 1603 124/74 - - 109 - 100 % - -  08/10/16 1602 135/62 98.6 F (37 C) Oral 95 18 - 5\' 4"  (1.626 m) 242 lb (109.8 kg)    Physical Exam  Constitutional: She is oriented to person, place, and time. She appears well-developed and well-nourished. No distress.  HENT:  Head: Normocephalic.  Eyes: Pupils are equal, round, and reactive to light.  Neck: Neck supple.  Respiratory: Effort normal.  Musculoskeletal: Normal range of motion.  Neurological: She is alert and oriented to person, place, and time. She has normal strength and normal reflexes. She displays normal reflexes.  Negative clonus   Skin: Skin is warm. She is not diaphoretic.  Psychiatric: Her behavior is normal.   Fetal Tracing: Baseline: 135 bpm  Variability: Moderate  Accelerations: 15x15 Decelerations: none Toco: none  MAU Course  Procedures  None  MDM  Next visit in the office is Friday for a BP check.  BP in MAU WNL.  Dr. Despina Hidden notified of labs and BP readings. Dr. Despina Hidden to come to MAU to assess the patient.   Assessment and Plan   A:  1. Elevated BP   2. Sinus pressure   3. Headache behind the eyes    P:  Discharge home in stable condition Keep your appointment in the office on Friday for BP check Rx: Flexeril, Fioricet Return to MAU if  symptoms worsen Preeclampsia precautions   Duane Lope, NP 08/10/2016 7:13 PM

## 2016-08-10 NOTE — Patient Instructions (Addendum)
AM I IN LABOR? What is labor? Labor is the work that your body does to birth your baby. Your uterus (the womb) contracts. Your cervix (the mouth of the uterus) opens. You will push your baby out into the world.  What do contractions (labor pains) feel like? When they first start, contractions usually feel like cramps during your period. Sometimes you feel pain in your back. Most often, contractions feel like muscles pulling painfully in your lower belly. At first, the contractions will probably be 15 to 20 minutes apart. They will not feel too painful. As labor goes on, the contractions get stronger, closer together, and more painful.  How do I time the contractions? Time your contractions by counting the number of minutes from the start of one contraction to the start of the next contraction.  What should I do when the contractions start? If it is night and you can sleep, sleep. If it happens during the day, here are some things you can do to take care of yourself at home: ? Walk. If the pains you are having are real labor, walking will make the contractions come faster and harder. If the contractions are not going to continue and be real labor, walking will make the contractions slow down. ? Take a shower or bath. This will help you relax. ? Eat. Labor is a big event. It takes a lot of energy. ? Drink water. Not drinking enough water can cause false labor (contractions that hurt but do not open your cervix). If this is true labor, drinking water will help you have strength to get through your labor. ? Take a nap. Get all the rest you can. ? Get a massage. If your labor is in your back, a strong massage on your lower back may feel very good. Getting a foot massage is always good. ? Don't panic. You can do this. Your body was made for this. You are strong!  When should I go to the hospital or call my health care provider? ? Your contractions have been 5 minutes apart or less for at least 1  hour. ? If several contractions are so painful you cannot walk or talk during one. ? Your bag of waters breaks. (You may have a big gush of water or just water that runs down your legs when you walk.)  Are there other reasons to call my health care provider? Yes, you should call your health care provider or go to the hospital if you start to bleed like you are having a period- blood that soaks your underwear or runs down your legs, if you have sudden severe pain, if your baby has not moved for several hours, or if you are leaking green fluid. The rule is as follows: If you are very concerned about something, call.   Hypertension During Pregnancy Hypertension, or high blood pressure, is when there is extra pressure inside your blood vessels that carry blood from the heart to the rest of your body (arteries). It can happen at any time in life, including pregnancy. Hypertension during pregnancy can cause problems for you and your baby. Your baby might not weigh as much as he or she should at birth or might be born early (premature). Very bad cases of hypertension during pregnancy can be life-threatening.  Different types of hypertension can occur during pregnancy. These include:  Chronic hypertension. This happens when a woman has hypertension before pregnancy and it continues during pregnancy.  Gestational hypertension. This is when  hypertension develops during pregnancy.  Preeclampsia or toxemia of pregnancy. This is a very serious type of hypertension that develops only during pregnancy. It affects the whole body and can be very dangerous for both mother and baby.  Gestational hypertension and preeclampsia usually go away after your baby is born. Your blood pressure will likely stabilize within 6 weeks. Women who have hypertension during pregnancy have a greater chance of developing hypertension later in life or with future pregnancies. RISK FACTORS There are certain factors that make it more  likely for you to develop hypertension during pregnancy. These include:  Having hypertension before pregnancy.  Having hypertension during a previous pregnancy.  Being overweight.  Being older than 40 years.  Being pregnant with more than one baby.  Having diabetes or kidney problems. SIGNS AND SYMPTOMS Chronic and gestational hypertension rarely cause symptoms. Preeclampsia has symptoms, which may include:  Increased protein in your urine. Your health care provider will check for this at every prenatal visit.  Swelling of your hands and face.  Rapid weight gain.  Headaches.  Visual changes.  Being bothered by light.  Abdominal pain, especially in the upper right area.  Chest pain.  Shortness of breath.  Increased reflexes.  Seizures. These occur with a more severe form of preeclampsia, called eclampsia. DIAGNOSIS  You may be diagnosed with hypertension during a regular prenatal exam. At each prenatal visit, you may have:  Your blood pressure checked.  A urine test to check for protein in your urine. The type of hypertension you are diagnosed with depends on when you developed it. It also depends on your specific blood pressure reading.  Developing hypertension before 20 weeks of pregnancy is consistent with chronic hypertension.  Developing hypertension after 20 weeks of pregnancy is consistent with gestational hypertension.  Hypertension with increased urinary protein is diagnosed as preeclampsia.  Blood pressure measurements that stay above 160 systolic or 110 diastolic are a sign of severe preeclampsia. TREATMENT Treatment for hypertension during pregnancy varies. Treatment depends on the type of hypertension and how serious it is.  If you take medicine for chronic hypertension, you may need to switch medicines.  Medicines called ACE inhibitors should not be taken during pregnancy.  Low-dose aspirin may be suggested for women who have risk factors for  preeclampsia.  If you have gestational hypertension, you may need to take a blood pressure medicine that is safe during pregnancy. Your health care provider will recommend the correct medicine.  If you have severe preeclampsia, you may need to be in the hospital. Health care providers will watch you and your baby very closely. You also may need to take medicine called magnesium sulfate to prevent seizures and lower blood pressure.  Sometimes, an early delivery is needed. This may be the case if the condition worsens. It would be done to protect you and your baby. The only cure for preeclampsia is delivery.  Your health care provider may recommend that you take one low-dose aspirin (81 mg) each day to help prevent high blood pressure during your pregnancy if you are at risk for preeclampsia. You may be at risk for preeclampsia if:  You had preeclampsia or eclampsia during a previous pregnancy.  Your baby did not grow as expected during a previous pregnancy.  You experienced preterm birth with a previous pregnancy.  You experienced a separation of the placenta from the uterus (placental abruption) during a previous pregnancy.  You experienced the loss of your baby during a previous pregnancy.  You are pregnant with more than one baby.  You have other medical conditions, such as diabetes or an autoimmune disease. HOME CARE INSTRUCTIONS  Schedule and keep all of your regular prenatal care appointments. This is important.  Take medicines only as directed by your health care provider. Tell your health care provider about all medicines you take.  Eat as little salt as possible.  Get regular exercise.  Do not drink alcohol.  Do not use tobacco products.  Do not drink products with caffeine.  Lie on your left side when resting. SEEK IMMEDIATE MEDICAL CARE IF:  You have severe abdominal pain.  You have sudden swelling in your hands, ankles, or face.  You gain 4 pounds (1.8 kg) or  more in 1 week.  You vomit repeatedly.  You have vaginal bleeding.  You do not feel your baby moving as much.  You have a headache.  You have blurred or double vision.  You have muscle twitching or spasms.  You have shortness of breath.  You have blue fingernails or lips.  You have blood in your urine. MAKE SURE YOU:  Understand these instructions.  Will watch your condition.  Will get help right away if you are not doing well or get worse.   This information is not intended to replace advice given to you by your health care provider. Make sure you discuss any questions you have with your health care provider.   Document Released: 07/26/2011 Document Revised: 11/28/2014 Document Reviewed: 06/06/2013 Elsevier Interactive Patient Education [employer]2016 [employer] Inc.

## 2016-08-11 ENCOUNTER — Other Ambulatory Visit: Payer: Self-pay | Admitting: Advanced Practice Midwife

## 2016-08-11 LAB — COMPREHENSIVE METABOLIC PANEL
A/G RATIO: 1.2 (ref 1.2–2.2)
ALBUMIN: 3.4 g/dL — AB (ref 3.5–5.5)
ALK PHOS: 79 IU/L (ref 39–117)
ALT: 10 IU/L (ref 0–32)
AST: 12 IU/L (ref 0–40)
BILIRUBIN TOTAL: 0.3 mg/dL (ref 0.0–1.2)
BUN / CREAT RATIO: 13 (ref 9–23)
BUN: 6 mg/dL (ref 6–20)
CHLORIDE: 105 mmol/L (ref 96–106)
CO2: 18 mmol/L (ref 18–29)
Calcium: 8.6 mg/dL — ABNORMAL LOW (ref 8.7–10.2)
Creatinine, Ser: 0.47 mg/dL — ABNORMAL LOW (ref 0.57–1.00)
GFR calc non Af Amer: 137 mL/min/{1.73_m2} (ref >59)
GFR, EST AFRICAN AMERICAN: 158 mL/min/{1.73_m2} (ref >59)
GLOBULIN, TOTAL: 2.8 g/dL (ref 1.5–4.5)
Glucose: 89 mg/dL (ref 65–99)
Potassium: 4.1 mmol/L (ref 3.5–5.2)
SODIUM: 138 mmol/L (ref 134–144)
TOTAL PROTEIN: 6.2 g/dL (ref 6.0–8.5)

## 2016-08-11 LAB — CBC
HEMATOCRIT: 32.3 % — AB (ref 34.0–46.6)
HEMOGLOBIN: 11 g/dL — AB (ref 11.1–15.9)
MCH: 27.2 pg (ref 26.6–33.0)
MCHC: 34.1 g/dL (ref 31.5–35.7)
MCV: 80 fL (ref 79–97)
Platelets: 221 10*3/uL (ref 150–379)
RBC: 4.04 x10E6/uL (ref 3.77–5.28)
RDW: 13.1 % (ref 12.3–15.4)
WBC: 9.2 10*3/uL (ref 3.4–10.8)

## 2016-08-12 ENCOUNTER — Ambulatory Visit (INDEPENDENT_AMBULATORY_CARE_PROVIDER_SITE_OTHER): Payer: Medicaid Other | Admitting: *Deleted

## 2016-08-12 ENCOUNTER — Encounter: Payer: Self-pay | Admitting: *Deleted

## 2016-08-12 VITALS — BP 108/72 | HR 100 | Ht 64.0 in | Wt 241.5 lb

## 2016-08-12 DIAGNOSIS — Z1389 Encounter for screening for other disorder: Secondary | ICD-10-CM | POA: Diagnosis not present

## 2016-08-12 DIAGNOSIS — Z331 Pregnant state, incidental: Secondary | ICD-10-CM

## 2016-08-12 DIAGNOSIS — Z3493 Encounter for supervision of normal pregnancy, unspecified, third trimester: Secondary | ICD-10-CM

## 2016-08-12 LAB — POCT URINALYSIS DIPSTICK
Glucose, UA: NEGATIVE
KETONES UA: NEGATIVE
Leukocytes, UA: NEGATIVE
Nitrite, UA: NEGATIVE
PROTEIN UA: NEGATIVE

## 2016-08-12 LAB — PROTEIN, URINE, 24 HOUR
PROTEIN UR: 19.1 mg/dL
Protein, 24H Urine: 287 mg/24 hr — ABNORMAL HIGH (ref 30–150)

## 2016-08-12 NOTE — Progress Notes (Signed)
Pt here for BP check. BP good at 108/72. Pt using Albuterol inhaler prn. Advised to return in 1 week for ob visit, sooner if worse. Pt voiced understanding. JSY

## 2016-08-18 ENCOUNTER — Encounter: Payer: Medicaid Other | Admitting: Advanced Practice Midwife

## 2016-08-23 ENCOUNTER — Ambulatory Visit (INDEPENDENT_AMBULATORY_CARE_PROVIDER_SITE_OTHER): Payer: Medicaid Other | Admitting: Women's Health

## 2016-08-23 VITALS — BP 120/64 | HR 86 | Wt 241.6 lb

## 2016-08-23 DIAGNOSIS — N898 Other specified noninflammatory disorders of vagina: Secondary | ICD-10-CM

## 2016-08-23 DIAGNOSIS — Z1389 Encounter for screening for other disorder: Secondary | ICD-10-CM

## 2016-08-23 DIAGNOSIS — B009 Herpesviral infection, unspecified: Secondary | ICD-10-CM

## 2016-08-23 DIAGNOSIS — Z3483 Encounter for supervision of other normal pregnancy, third trimester: Secondary | ICD-10-CM

## 2016-08-23 DIAGNOSIS — O26899 Other specified pregnancy related conditions, unspecified trimester: Secondary | ICD-10-CM

## 2016-08-23 DIAGNOSIS — N9089 Other specified noninflammatory disorders of vulva and perineum: Secondary | ICD-10-CM

## 2016-08-23 DIAGNOSIS — O09899 Supervision of other high risk pregnancies, unspecified trimester: Secondary | ICD-10-CM

## 2016-08-23 DIAGNOSIS — Z3685 Encounter for antenatal screening for Streptococcus B: Secondary | ICD-10-CM

## 2016-08-23 DIAGNOSIS — Z369 Encounter for antenatal screening, unspecified: Secondary | ICD-10-CM

## 2016-08-23 DIAGNOSIS — Z331 Pregnant state, incidental: Secondary | ICD-10-CM

## 2016-08-23 DIAGNOSIS — Z6791 Unspecified blood type, Rh negative: Secondary | ICD-10-CM

## 2016-08-23 DIAGNOSIS — O26843 Uterine size-date discrepancy, third trimester: Secondary | ICD-10-CM

## 2016-08-23 LAB — POCT URINALYSIS DIPSTICK
Blood, UA: NEGATIVE
Glucose, UA: NEGATIVE
Ketones, UA: NEGATIVE
LEUKOCYTES UA: NEGATIVE
NITRITE UA: NEGATIVE
Protein, UA: NEGATIVE

## 2016-08-23 LAB — POCT WET PREP (WET MOUNT)
Clue Cells Wet Prep Whiff POC: POSITIVE
Trichomonas Wet Prep HPF POC: ABSENT

## 2016-08-23 LAB — OB RESULTS CONSOLE GBS: GBS: NEGATIVE

## 2016-08-23 MED ORDER — METRONIDAZOLE 500 MG PO TABS: 500.0000 mg | ORAL_TABLET | Freq: Two times a day (BID) | ORAL | 0 refills | Status: DC

## 2016-08-23 NOTE — Progress Notes (Signed)
Low-risk OB appointment [id number] Estimated Date of Delivery: 09/09/16 BP 120/64   Pulse 86   Wt 241 lb 9.6 oz (109.6 kg)   BMI 41.47 kg/m   BP, weight, and urine reviewed.  Refer to obstetrical flow sheet for FH & FHR.  Reports good fm.  Denies regular uc's, lof, vb, or uti s/s. Still w/ lots of vulvar irritation/itching, denies abnormal d/c or odor. Using aquaphor type lotion w/o relief.  Spec exam: cx visually closed, mod amt thin white malodorous d/c, wet prep many clues, Rx metronidazole 500mg  BID x 7d for BV, no sex while taking  SVE per request: cx very posterior, 2cm/th/-3, vtx Reviewed labor s/s, fkc. Plan:  Continue routine obstetrical care  F/U asap for efw/afi u/s for s>d (no visit), then 1wk for OB appointment

## 2016-08-23 NOTE — Patient Instructions (Signed)
Call the office ([phone]) or go to [facility] if:  You begin to have strong, frequent contractions  Your water breaks.  Sometimes it is a big gush of fluid, sometimes it is just a trickle that keeps getting your panties wet or running down your legs  You have vaginal bleeding.  It is normal to have a small amount of spotting if your cervix was checked.   You don't feel your baby moving like normal.  If you don't, get you something to eat and drink and lay down and focus on feeling your baby move.  You should feel at least 10 movements in 2 hours.  If you don't, you should call the office or go to [facility].    Braxton Hicks Contractions Contractions of the uterus can occur throughout pregnancy. Contractions are not always a sign that you are in labor.  WHAT ARE BRAXTON HICKS CONTRACTIONS?  Contractions that occur before labor are called Braxton Hicks contractions, or false labor. Toward the end of pregnancy (32-34 weeks), these contractions can develop more often and may become more forceful. This is not true labor because these contractions do not result in opening (dilatation) and thinning of the cervix. They are sometimes difficult to tell apart from true labor because these contractions can be forceful and people have different pain tolerances. You should not feel embarrassed if you go to the hospital with false labor. Sometimes, the only way to tell if you are in true labor is for your health care provider to look for changes in the cervix. If there are no prenatal problems or other health problems associated with the pregnancy, it is completely safe to be sent home with false labor and await the onset of true labor. HOW CAN YOU TELL THE DIFFERENCE BETWEEN TRUE AND FALSE LABOR? False Labor  The contractions of false labor are usually shorter and not as hard as those of true labor.   The contractions are usually irregular.   The contractions are often felt in the front of  the lower abdomen and in the groin.   The contractions may go away when you walk around or change positions while lying down.   The contractions get weaker and are shorter lasting as time goes on.   The contractions do not usually become progressively stronger, regular, and closer together as with true labor.  True Labor  Contractions in true labor last 30-70 seconds, become very regular, usually become more intense, and increase in frequency.   The contractions do not go away with walking.   The discomfort is usually felt in the top of the uterus and spreads to the lower abdomen and low back.   True labor can be determined by your health care provider with an exam. This will show that the cervix is dilating and getting thinner.  WHAT TO REMEMBER  Keep up with your usual exercises and follow other instructions given by your health care provider.   Take medicines as directed by your health care provider.   Keep your regular prenatal appointments.   Eat and drink lightly if you think you are going into labor.   If Braxton Hicks contractions are making you uncomfortable:   Change your position from lying down or resting to walking, or from walking to resting.   Sit and rest in a tub of warm water.   Drink 2-3 glasses of water. Dehydration may cause these contractions.   Do slow and deep breathing several times an hour.    WHEN SHOULD I SEEK IMMEDIATE MEDICAL CARE? Seek immediate medical care if:  Your contractions become stronger, more regular, and closer together.   You have fluid leaking or gushing from your vagina.   You have a fever.   You pass blood-tinged mucus.   You have vaginal bleeding.   You have continuous abdominal pain.   You have low back pain that you never had before.   You feel your baby's head pushing down and causing pelvic pressure.   Your baby is not moving as much as it used to.    This information is not intended to  replace advice given to you by your health care provider. Make sure you discuss any questions you have with your health care provider.   Document Released: 11/07/2005 Document Revised: 11/12/2013 Document Reviewed: 08/19/2013 Elsevier Interactive Patient Education 2016 [employer]  

## 2016-08-24 ENCOUNTER — Ambulatory Visit (INDEPENDENT_AMBULATORY_CARE_PROVIDER_SITE_OTHER): Payer: Medicaid Other

## 2016-08-24 DIAGNOSIS — O26843 Uterine size-date discrepancy, third trimester: Secondary | ICD-10-CM | POA: Diagnosis not present

## 2016-08-24 DIAGNOSIS — Z3A38 38 weeks gestation of pregnancy: Secondary | ICD-10-CM | POA: Diagnosis not present

## 2016-08-24 DIAGNOSIS — Z3493 Encounter for supervision of normal pregnancy, unspecified, third trimester: Secondary | ICD-10-CM

## 2016-08-24 NOTE — Progress Notes (Signed)
US 37+5 wks,cephalic,ant pl gr 2,fhr 155 bpm,normal ov's bilat,afi 11.7cm,efw 3315 g 61%

## 2016-08-25 LAB — GC/CHLAMYDIA PROBE AMP
Chlamydia trachomatis, NAA: NEGATIVE
Neisseria gonorrhoeae by PCR: NEGATIVE

## 2016-08-25 LAB — STREP GP B NAA+RFLX: Strep Gp B NAA+Rflx: NEGATIVE

## 2016-08-30 ENCOUNTER — Encounter: Payer: Self-pay | Admitting: Advanced Practice Midwife

## 2016-08-30 ENCOUNTER — Ambulatory Visit (INDEPENDENT_AMBULATORY_CARE_PROVIDER_SITE_OTHER): Payer: Medicaid Other | Admitting: Advanced Practice Midwife

## 2016-08-30 VITALS — BP 130/68 | HR 80 | Wt 242.0 lb

## 2016-08-30 DIAGNOSIS — Z3493 Encounter for supervision of normal pregnancy, unspecified, third trimester: Secondary | ICD-10-CM

## 2016-08-30 DIAGNOSIS — Z1389 Encounter for screening for other disorder: Secondary | ICD-10-CM

## 2016-08-30 DIAGNOSIS — Z331 Pregnant state, incidental: Secondary | ICD-10-CM

## 2016-08-30 LAB — POCT URINALYSIS DIPSTICK
Blood, UA: NEGATIVE
Glucose, UA: NEGATIVE
KETONES UA: NEGATIVE
LEUKOCYTES UA: NEGATIVE
Nitrite, UA: NEGATIVE
PROTEIN UA: NEGATIVE

## 2016-08-30 NOTE — Progress Notes (Signed)
[id number] 6359w4d Estimated Date of Delivery: 09/09/16  Blood pressure 130/68, pulse 80, weight 242 lb (109.8 kg), not currently breastfeeding.   BP weight and urine results all reviewed and noted.  Please refer to the obstetrical flow sheet for the fundal height and fetal heart rate documentation:  Patient reports good fetal movement, denies any bleeding and no rupture of membranes symptoms or regular contractions. Patient still has lots of vulvar irritaion. Just finished meds for BV--no inside complaints at all. Has a wart which recently popped up.  Vulva looks completely normal today.  SSE:  Scant discharge, no odor, . May try hydrocortisone, will consider stronger steroid after pregancy.  All questions were answered.  Orders Placed This Encounter  Procedures  . POCT urinalysis dipstick    Plan:  Continued routine obstetrical care, borderline BPs.  Discussed POC with Dr. Despina HiddenEure.  Does not feel the need to label her GHTN when she has so many normal BPs.  Will keep a close eye.   Return in about 3 days (around 09/02/2016) for LROB.

## 2016-09-02 ENCOUNTER — Ambulatory Visit (INDEPENDENT_AMBULATORY_CARE_PROVIDER_SITE_OTHER): Payer: Medicaid Other | Admitting: Women's Health

## 2016-09-02 ENCOUNTER — Inpatient Hospital Stay (HOSPITAL_COMMUNITY)
Admission: AD | Admit: 2016-09-02 | Discharge: 2016-09-04 | DRG: 774 | Disposition: A | Discharge status: Home / Self Care | Payer: Medicaid Other | Source: Ambulatory Visit | Attending: Obstetrics and Gynecology | Admitting: Obstetrics and Gynecology

## 2016-09-02 ENCOUNTER — Encounter: Payer: Self-pay | Admitting: Women's Health

## 2016-09-02 VITALS — BP 118/70 | HR 92 | Wt 242.0 lb

## 2016-09-02 DIAGNOSIS — Z8249 Family history of ischemic heart disease and other diseases of the circulatory system: Secondary | ICD-10-CM

## 2016-09-02 DIAGNOSIS — O99513 Diseases of the respiratory system complicating pregnancy, third trimester: Secondary | ICD-10-CM | POA: Diagnosis not present

## 2016-09-02 DIAGNOSIS — Z3483 Encounter for supervision of other normal pregnancy, third trimester: Secondary | ICD-10-CM

## 2016-09-02 DIAGNOSIS — O9832 Other infections with a predominantly sexual mode of transmission complicating childbirth: Secondary | ICD-10-CM | POA: Diagnosis present

## 2016-09-02 DIAGNOSIS — O9952 Diseases of the respiratory system complicating childbirth: Secondary | ICD-10-CM | POA: Diagnosis present

## 2016-09-02 DIAGNOSIS — Z3A39 39 weeks gestation of pregnancy: Secondary | ICD-10-CM

## 2016-09-02 DIAGNOSIS — J019 Acute sinusitis, unspecified: Secondary | ICD-10-CM | POA: Diagnosis present

## 2016-09-02 DIAGNOSIS — J45909 Unspecified asthma, uncomplicated: Secondary | ICD-10-CM | POA: Diagnosis present

## 2016-09-02 DIAGNOSIS — K219 Gastro-esophageal reflux disease without esophagitis: Secondary | ICD-10-CM | POA: Diagnosis present

## 2016-09-02 DIAGNOSIS — Z1389 Encounter for screening for other disorder: Secondary | ICD-10-CM

## 2016-09-02 DIAGNOSIS — A6000 Herpesviral infection of urogenital system, unspecified: Secondary | ICD-10-CM | POA: Diagnosis present

## 2016-09-02 DIAGNOSIS — O9962 Diseases of the digestive system complicating childbirth: Secondary | ICD-10-CM | POA: Diagnosis present

## 2016-09-02 DIAGNOSIS — Z6791 Unspecified blood type, Rh negative: Secondary | ICD-10-CM

## 2016-09-02 DIAGNOSIS — Z833 Family history of diabetes mellitus: Secondary | ICD-10-CM

## 2016-09-02 DIAGNOSIS — Z331 Pregnant state, incidental: Secondary | ICD-10-CM

## 2016-09-02 DIAGNOSIS — J0100 Acute maxillary sinusitis, unspecified: Secondary | ICD-10-CM

## 2016-09-02 DIAGNOSIS — O26893 Other specified pregnancy related conditions, third trimester: Secondary | ICD-10-CM | POA: Diagnosis present

## 2016-09-02 DIAGNOSIS — O4202 Full-term premature rupture of membranes, onset of labor within 24 hours of rupture: Principal | ICD-10-CM | POA: Diagnosis present

## 2016-09-02 LAB — POCT URINALYSIS DIPSTICK
GLUCOSE UA: NEGATIVE
Ketones, UA: NEGATIVE
NITRITE UA: NEGATIVE
PROTEIN UA: NEGATIVE
RBC UA: NEGATIVE

## 2016-09-02 MED ORDER — AMOXICILLIN-POT CLAVULANATE 875-125 MG PO TABS: 1.0000 | ORAL_TABLET | Freq: Two times a day (BID) | ORAL | 0 refills | Status: DC

## 2016-09-02 NOTE — Progress Notes (Signed)
Low-risk OB appointment [id number] 138w0d Estimated Date of Delivery: 09/09/16 BP 118/70   Pulse 92   Wt 242 lb (109.8 kg)   BMI 41.54 kg/m   BP, weight, and urine reviewed.  Refer to obstetrical flow sheet for FH & FHR.  Reports good fm.  Denies regular uc's, lof, vb, or uti s/s. Cold/sinus sx x 3wks now, not getting better/feels like getting worse. Sinus pain, congestions, cough productive of green phlegm. No fever/chills, earache, sore throat. HRRR, LCTAB, +ethymoid and maxillary sinus pressure. Rx augmenting (amoxicillin and cefdinir not true allergies).  Declines SVE Reviewed labor s/s, fkc. Plan:  Continue routine obstetrical care  F/U in 1wk for OB appointment

## 2016-09-02 NOTE — Patient Instructions (Signed)
Call the office [phone]) or go to [facility] if:  You begin to have strong, frequent contractions  Your water breaks.  Sometimes it is a big gush of fluid, sometimes it is just a trickle that keeps getting your panties wet or running down your legs  You have vaginal bleeding.  It is normal to have a small amount of spotting if your cervix was checked.   You don't feel your baby moving like normal.  If you don't, get you something to eat and drink and lay down and focus on feeling your baby move.  You should feel at least 10 movements in 2 hours.  If you don't, you should call the office or go to [facility].     Humidifier and saline nasal spray for nasal congestion  Regular robitussin, cough drops for cough  Warm salt water gargles for sore throat  Mucinex with lots of water to help you cough up the mucous in your chest if needed  Drink plenty of fluids and stay hydrated!  Wash your hands frequently.      Braxton Hicks Contractions Contractions of the uterus can occur throughout pregnancy. Contractions are not always a sign that you are in labor.  WHAT ARE BRAXTON HICKS CONTRACTIONS?  Contractions that occur before labor are called Braxton Hicks contractions, or false labor. Toward the end of pregnancy (32-34 weeks), these contractions can develop more often and may become more forceful. This is not true labor because these contractions do not result in opening (dilatation) and thinning of the cervix. They are sometimes difficult to tell apart from true labor because these contractions can be forceful and people have different pain tolerances. You should not feel embarrassed if you go to the hospital with false labor. Sometimes, the only way to tell if you are in true labor is for your health care provider to look for changes in the cervix. If there are no prenatal problems or other health problems associated with the pregnancy, it is completely safe to be sent home with  false labor and await the onset of true labor. HOW CAN YOU TELL THE DIFFERENCE BETWEEN TRUE AND FALSE LABOR? False Labor  The contractions of false labor are usually shorter and not as hard as those of true labor.   The contractions are usually irregular.   The contractions are often felt in the front of the lower abdomen and in the groin.   The contractions may go away when you walk around or change positions while lying down.   The contractions get weaker and are shorter lasting as time goes on.   The contractions do not usually become progressively stronger, regular, and closer together as with true labor.  True Labor  Contractions in true labor last 30-70 seconds, become very regular, usually become more intense, and increase in frequency.   The contractions do not go away with walking.   The discomfort is usually felt in the top of the uterus and spreads to the lower abdomen and low back.   True labor can be determined by your health care provider with an exam. This will show that the cervix is dilating and getting thinner.  WHAT TO REMEMBER  Keep up with your usual exercises and follow other instructions given by your health care provider.   Take medicines as directed by your health care provider.   Keep your regular prenatal appointments.   Eat and drink lightly if you think you are going into labor.  If Braxton Hicks contractions are making you uncomfortable:   Change your position from lying down or resting to walking, or from walking to resting.   Sit and rest in a tub of warm water.   Drink 2-3 glasses of water. Dehydration may cause these contractions.   Do slow and deep breathing several times an hour.  WHEN SHOULD I SEEK IMMEDIATE MEDICAL CARE? Seek immediate medical care if:  Your contractions become stronger, more regular, and closer together.   You have fluid leaking or gushing from your vagina.   You have a fever.   You pass  blood-tinged mucus.   You have vaginal bleeding.   You have continuous abdominal pain.   You have low back pain that you never had before.   You feel your baby's head pushing down and causing pelvic pressure.   Your baby is not moving as much as it used to.    This information is not intended to replace advice given to you by your health care provider. Make sure you discuss any questions you have with your health care provider.   Document Released: 11/07/2005 Document Revised: 11/12/2013 Document Reviewed: 08/19/2013 Elsevier Interactive Patient Education [employer]

## 2016-09-02 NOTE — MAU Note (Signed)
Sitting on floor about 2100 and when I stood some mucousy d/c came out. Has happened couple more times. Was 2cm earlier this wk. No bleeding. Some cramping for a wk or two

## 2016-09-03 ENCOUNTER — Inpatient Hospital Stay (HOSPITAL_COMMUNITY): Payer: Medicaid Other | Admitting: Anesthesiology

## 2016-09-03 ENCOUNTER — Encounter (HOSPITAL_COMMUNITY): Payer: Self-pay | Admitting: *Deleted

## 2016-09-03 DIAGNOSIS — O9962 Diseases of the digestive system complicating childbirth: Secondary | ICD-10-CM

## 2016-09-03 DIAGNOSIS — J45909 Unspecified asthma, uncomplicated: Secondary | ICD-10-CM | POA: Diagnosis present

## 2016-09-03 DIAGNOSIS — Z3A39 39 weeks gestation of pregnancy: Secondary | ICD-10-CM

## 2016-09-03 DIAGNOSIS — Z833 Family history of diabetes mellitus: Secondary | ICD-10-CM | POA: Diagnosis not present

## 2016-09-03 DIAGNOSIS — K219 Gastro-esophageal reflux disease without esophagitis: Secondary | ICD-10-CM | POA: Diagnosis present

## 2016-09-03 DIAGNOSIS — O4202 Full-term premature rupture of membranes, onset of labor within 24 hours of rupture: Secondary | ICD-10-CM | POA: Diagnosis present

## 2016-09-03 DIAGNOSIS — O9832 Other infections with a predominantly sexual mode of transmission complicating childbirth: Secondary | ICD-10-CM | POA: Diagnosis present

## 2016-09-03 DIAGNOSIS — Z8249 Family history of ischemic heart disease and other diseases of the circulatory system: Secondary | ICD-10-CM | POA: Diagnosis not present

## 2016-09-03 DIAGNOSIS — Z6791 Unspecified blood type, Rh negative: Secondary | ICD-10-CM | POA: Diagnosis not present

## 2016-09-03 DIAGNOSIS — O9952 Diseases of the respiratory system complicating childbirth: Secondary | ICD-10-CM | POA: Diagnosis present

## 2016-09-03 DIAGNOSIS — A6000 Herpesviral infection of urogenital system, unspecified: Secondary | ICD-10-CM | POA: Diagnosis present

## 2016-09-03 DIAGNOSIS — O26893 Other specified pregnancy related conditions, third trimester: Secondary | ICD-10-CM

## 2016-09-03 DIAGNOSIS — J019 Acute sinusitis, unspecified: Secondary | ICD-10-CM | POA: Diagnosis present

## 2016-09-03 LAB — CBC
HCT: 32.6 % — ABNORMAL LOW (ref 36.0–46.0)
Hemoglobin: 11 g/dL — ABNORMAL LOW (ref 12.0–15.0)
MCH: 26.1 pg (ref 26.0–34.0)
MCHC: 33.7 g/dL (ref 30.0–36.0)
MCV: 77.3 fL — ABNORMAL LOW (ref 78.0–100.0)
Platelets: 241 10*3/uL (ref 150–400)
RBC: 4.22 MIL/uL (ref 3.87–5.11)
RDW: 14 % (ref 11.5–15.5)
WBC: 10.6 10*3/uL — ABNORMAL HIGH (ref 4.0–10.5)

## 2016-09-03 LAB — RPR: RPR Ser Ql: NONREACTIVE

## 2016-09-03 MED ORDER — ZOLPIDEM TARTRATE 5 MG PO TABS: 5.0000 mg | ORAL_TABLET | Freq: Every evening | ORAL | Status: DC | PRN

## 2016-09-03 MED ORDER — OXYCODONE-ACETAMINOPHEN 5-325 MG PO TABS: 1.0000 | ORAL_TABLET | ORAL | Status: DC | PRN

## 2016-09-03 MED ORDER — ONDANSETRON HCL 4 MG/2ML IJ SOLN: 4.0000 mg | INTRAMUSCULAR | Status: DC | PRN

## 2016-09-03 MED ORDER — WITCH HAZEL-GLYCERIN EX PADS: 1.0000 "application " | MEDICATED_PAD | CUTANEOUS | Status: DC | PRN

## 2016-09-03 MED ORDER — FENTANYL CITRATE (PF) 100 MCG/2ML IJ SOLN
50.0000 ug | Freq: Once | INTRAMUSCULAR | Status: AC
  Administered 2016-09-03: 50 ug via INTRAVENOUS

## 2016-09-03 MED ORDER — IBUPROFEN 600 MG PO TABS
600.0000 mg | ORAL_TABLET | Freq: Four times a day (QID) | ORAL | Status: DC
  Administered 2016-09-03 – 2016-09-04 (×4): 600 mg via ORAL
  Filled 2016-09-03 (×4): qty 1

## 2016-09-03 MED ORDER — ACYCLOVIR 400 MG PO TABS
400.0000 mg | ORAL_TABLET | Freq: Three times a day (TID) | ORAL | Status: DC
  Filled 2016-09-03 (×4): qty 1

## 2016-09-03 MED ORDER — OXYCODONE-ACETAMINOPHEN 5-325 MG PO TABS: 2.0000 | ORAL_TABLET | ORAL | Status: DC | PRN

## 2016-09-03 MED ORDER — BENZOCAINE-MENTHOL 20-0.5 % EX AERO
1.0000 "application " | INHALATION_SPRAY | CUTANEOUS | Status: DC | PRN
  Filled 2016-09-03: qty 56

## 2016-09-03 MED ORDER — ACETAMINOPHEN 325 MG PO TABS: 650.0000 mg | ORAL_TABLET | ORAL | Status: DC | PRN

## 2016-09-03 MED ORDER — MISOPROSTOL 200 MCG PO TABS
50.0000 ug | ORAL_TABLET | ORAL | Status: DC
  Administered 2016-09-03 (×2): 50 ug via ORAL
  Filled 2016-09-03 (×2): qty 0.5

## 2016-09-03 MED ORDER — FENTANYL CITRATE (PF) 100 MCG/2ML IJ SOLN
INTRAMUSCULAR | Status: AC
  Filled 2016-09-03: qty 2

## 2016-09-03 MED ORDER — COCONUT OIL OIL: 1.0000 "application " | TOPICAL_OIL | Status: DC | PRN

## 2016-09-03 MED ORDER — SOD CITRATE-CITRIC ACID 500-334 MG/5ML PO SOLN: 30.0000 mL | ORAL | Status: DC | PRN

## 2016-09-03 MED ORDER — LIDOCAINE HCL (PF) 1 % IJ SOLN
30.0000 mL | INTRAMUSCULAR | Status: DC | PRN
  Administered 2016-09-03: 30 mL via SUBCUTANEOUS
  Filled 2016-09-03: qty 30

## 2016-09-03 MED ORDER — SENNOSIDES-DOCUSATE SODIUM 8.6-50 MG PO TABS
2.0000 | ORAL_TABLET | ORAL | Status: DC
  Administered 2016-09-03: 2 via ORAL
  Filled 2016-09-03: qty 2

## 2016-09-03 MED ORDER — EPHEDRINE 5 MG/ML INJ
10.0000 mg | INTRAVENOUS | Status: DC | PRN
  Filled 2016-09-03: qty 4

## 2016-09-03 MED ORDER — FENTANYL 2.5 MCG/ML BUPIVACAINE 1/10 % EPIDURAL INFUSION (WH - ANES)
14.0000 mL/h | INTRAMUSCULAR | Status: DC | PRN
  Administered 2016-09-03 (×2): 14 mL/h via EPIDURAL
  Filled 2016-09-03: qty 125

## 2016-09-03 MED ORDER — OXYTOCIN BOLUS FROM INFUSION
500.0000 mL | Freq: Once | INTRAVENOUS | Status: AC
  Administered 2016-09-03: 500 mL/h via INTRAVENOUS

## 2016-09-03 MED ORDER — GUAIFENESIN ER 600 MG PO TB12
600.0000 mg | ORAL_TABLET | Freq: Two times a day (BID) | ORAL | Status: DC | PRN
  Administered 2016-09-03: 600 mg via ORAL
  Filled 2016-09-03 (×2): qty 1

## 2016-09-03 MED ORDER — TETANUS-DIPHTH-ACELL PERTUSSIS 5-2.5-18.5 LF-MCG/0.5 IM SUSP: 0.5000 mL | Freq: Once | INTRAMUSCULAR | Status: DC

## 2016-09-03 MED ORDER — PHENYLEPHRINE 40 MCG/ML (10ML) SYRINGE FOR IV PUSH (FOR BLOOD PRESSURE SUPPORT)
80.0000 ug | PREFILLED_SYRINGE | INTRAVENOUS | Status: DC | PRN
  Filled 2016-09-03: qty 5
  Filled 2016-09-03: qty 10

## 2016-09-03 MED ORDER — FENTANYL CITRATE (PF) 100 MCG/2ML IJ SOLN
100.0000 ug | INTRAMUSCULAR | Status: DC | PRN
  Administered 2016-09-03: 100 ug via INTRAVENOUS
  Filled 2016-09-03: qty 2

## 2016-09-03 MED ORDER — ONDANSETRON HCL 4 MG/2ML IJ SOLN: 4.0000 mg | Freq: Four times a day (QID) | INTRAMUSCULAR | Status: DC | PRN

## 2016-09-03 MED ORDER — ONDANSETRON HCL 4 MG PO TABS: 4.0000 mg | ORAL_TABLET | ORAL | Status: DC | PRN

## 2016-09-03 MED ORDER — DIPHENHYDRAMINE HCL 50 MG/ML IJ SOLN: 12.5000 mg | INTRAMUSCULAR | Status: DC | PRN

## 2016-09-03 MED ORDER — LACTATED RINGERS IV SOLN
INTRAVENOUS | Status: DC
  Administered 2016-09-03: 02:00:00 via INTRAVENOUS

## 2016-09-03 MED ORDER — AMOXICILLIN-POT CLAVULANATE 875-125 MG PO TABS
1.0000 | ORAL_TABLET | Freq: Two times a day (BID) | ORAL | Status: DC
  Administered 2016-09-03 – 2016-09-04 (×2): 1 via ORAL
  Filled 2016-09-03 (×4): qty 1

## 2016-09-03 MED ORDER — LACTATED RINGERS IV SOLN
500.0000 mL | Freq: Once | INTRAVENOUS | Status: AC
  Administered 2016-09-03: 500 mL via INTRAVENOUS

## 2016-09-03 MED ORDER — FLEET ENEMA 7-19 GM/118ML RE ENEM: 1.0000 | ENEMA | RECTAL | Status: DC | PRN

## 2016-09-03 MED ORDER — LACTATED RINGERS IV SOLN
500.0000 mL | INTRAVENOUS | Status: DC | PRN
  Administered 2016-09-03: 500 mL via INTRAVENOUS
  Administered 2016-09-03: 1000 mL via INTRAVENOUS

## 2016-09-03 MED ORDER — LIDOCAINE HCL (PF) 1 % IJ SOLN
INTRAMUSCULAR | Status: DC | PRN
  Administered 2016-09-03 (×2): 7 mL via EPIDURAL
  Administered 2016-09-03: 9 mL via EPIDURAL

## 2016-09-03 MED ORDER — ACETAMINOPHEN 325 MG PO TABS
650.0000 mg | ORAL_TABLET | ORAL | Status: DC | PRN
Start: 2016-09-03 — End: 2016-09-03

## 2016-09-03 MED ORDER — VITAMIN K1 1 MG/0.5ML IJ SOLN
INTRAMUSCULAR | Status: AC
  Filled 2016-09-03: qty 0.5

## 2016-09-03 MED ORDER — MIDAZOLAM HCL 2 MG/2ML IJ SOLN
1.0000 mg | Freq: Once | INTRAMUSCULAR | Status: AC
Start: 2016-09-03 — End: 2016-09-03
  Administered 2016-09-03: 1 mg via INTRAVENOUS
  Filled 2016-09-03: qty 2

## 2016-09-03 MED ORDER — DIPHENHYDRAMINE HCL 25 MG PO CAPS: 25.0000 mg | ORAL_CAPSULE | Freq: Four times a day (QID) | ORAL | Status: DC | PRN

## 2016-09-03 MED ORDER — PRENATAL MULTIVITAMIN CH
1.0000 | ORAL_TABLET | Freq: Every day | ORAL | Status: DC
  Administered 2016-09-04: 1 via ORAL
  Filled 2016-09-03: qty 1

## 2016-09-03 MED ORDER — SIMETHICONE 80 MG PO CHEW: 80.0000 mg | CHEWABLE_TABLET | ORAL | Status: DC | PRN

## 2016-09-03 MED ORDER — OXYTOCIN 40 UNITS IN LACTATED RINGERS INFUSION - SIMPLE MED
2.5000 [IU]/h | INTRAVENOUS | Status: DC
  Filled 2016-09-03: qty 1000

## 2016-09-03 MED ORDER — PHENYLEPHRINE 40 MCG/ML (10ML) SYRINGE FOR IV PUSH (FOR BLOOD PRESSURE SUPPORT)
80.0000 ug | PREFILLED_SYRINGE | INTRAVENOUS | Status: DC | PRN
  Filled 2016-09-03: qty 5

## 2016-09-03 MED ORDER — DIBUCAINE 1 % RE OINT: 1.0000 "application " | TOPICAL_OINTMENT | RECTAL | Status: DC | PRN

## 2016-09-03 MED ORDER — FENTANYL CITRATE (PF) 100 MCG/2ML IJ SOLN: 50.0000 ug | Freq: Once | INTRAMUSCULAR | Status: DC

## 2016-09-03 MED ORDER — MIDAZOLAM HCL 2 MG/2ML IJ SOLN: 1.0000 mg | Freq: Once | INTRAMUSCULAR | Status: DC

## 2016-09-03 NOTE — Anesthesia Procedure Notes (Signed)
Epidural Patient location during procedure: OB Start time: 09/03/2016 9:00 AM End time: 09/03/2016 9:04 AM  Staffing Anesthesiologist: Leilani AbleHATCHETT, Abshir Paolini  Preanesthetic Checklist Completed: patient identified, surgical consent, pre-op evaluation, timeout performed, IV checked, risks and benefits discussed and monitors and equipment checked  Epidural Patient position: sitting Prep: site prepped and draped and DuraPrep Patient monitoring: continuous pulse ox and blood pressure Approach: midline Location: L3-L4 Injection technique: LOR air  Needle:  Needle type: Tuohy  Needle gauge: 17 G Needle length: 9 cm and 9 Needle insertion depth: 7 cm Catheter type: closed end flexible Catheter size: 19 Gauge Catheter at skin depth: 10 cm Test dose: negative and Other  Assessment Sensory level: T9 Events: blood not aspirated, injection not painful, no injection resistance, negative IV test and no paresthesia  Additional Notes Reason for block:procedure for pain

## 2016-09-03 NOTE — MAU Note (Signed)
PT  SAYS    SROM  AT 0900  -  FELT    A LOT    MUCUSY      D/C.  ON LEGS.    HAS HX  OF HSV  - NO OUTBREAK  THIS   PREG.    IS  TAKING  VALTREX.     DENIES  MRSA.  GBS- NEG     VE IN OFFICE   2 CM   - THIS  WEEK

## 2016-09-03 NOTE — Anesthesia Procedure Notes (Signed)
Epidural Patient location during procedure: OB Start time: 09/03/2016 10:32 AM End time: 09/03/2016 10:36 AM  Staffing Anesthesiologist: Leilani AbleHATCHETT, Ebonee Stober Performed: anesthesiologist   Preanesthetic Checklist Completed: patient identified, surgical consent, pre-op evaluation, timeout performed, IV checked, risks and benefits discussed and monitors and equipment checked  Epidural Patient position: sitting Prep: site prepped and draped and DuraPrep Patient monitoring: continuous pulse ox and blood pressure Approach: midline Location: L3-L4 Injection technique: LOR air  Needle:  Needle type: Tuohy  Needle gauge: 17 G Needle length: 9 cm and 9 Needle insertion depth: 5 cm cm Catheter type: closed end flexible Catheter size: 19 Gauge Catheter at skin depth: 10 cm Test dose: negative and Other  Assessment Sensory level: T9 Events: blood not aspirated, injection not painful, no injection resistance, negative IV test and no paresthesia  Additional Notes Reason for block:procedure for pain

## 2016-09-03 NOTE — Anesthesia Pain Management Evaluation Note (Signed)
  CRNA Pain Management Visit Note  Patient: Jasmine Maynard, 25 y.o., female  "Hello I am a member of the anesthesia team at [facility]. We have an anesthesia team available at all times to provide care throughout the hospital, including epidural management and anesthesia for C-section. I don't know your plan for the delivery whether it a natural birth, water birth, IV sedation, nitrous supplementation, doula or epidural, but we want to meet your pain goals."   1.Was your pain managed to your expectations on prior hospitalizations?   No   2.What is your expectation for pain management during this hospitalization?     Patient wants to try IV pain meds. Discussed epidural patient unsure if she wants epidural for this birth  3.How can we help you reach that goal? IV pain meds question epidural  Record the patient's initial score and the patient's pain goal.   Pain: 8  Pain Goal: 10 [facility] wants you to be able to say your pain was always managed very well.  Rica RecordsICKELTON,Jasmine Maynard 09/03/2016

## 2016-09-03 NOTE — H&P (Signed)
LABOR AND DELIVERY ADMISSION HISTORY AND PHYSICAL NOTE  Molly Garant is a 25 y.o. female G62P2002 with IUP at [redacted]w[redacted]d by 8 week [place] presenting for SROM (clear) at 2300. She has only had mild contractions over the last few weeks.   She reports positive fetal movement. She denies vaginal bleeding.  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 Has not had an outbreak in many years. - History of post-partum HTN. Was prescribed HCTZ but never came for postpartum visit. - Blood type O-, given Rhogam 08/10/16 - Short interval between pregnancies- last baby born 09/25/15  Past Medical History: Past Medical History:  Diagnosis Date  . ADHD (attention deficit hyperactivity disorder)   . Asthma   . DDD (degenerative disc disease)   . Depression    fine problem  . Heartburn   . Herpes   . Perennial allergic rhinitis   . Pregnant 01/21/2015  . Pregnant 01/26/2016  . Vertigo     Past Surgical History: Past Surgical History:  Procedure Laterality Date  . CHOLECYSTECTOMY  02/13/2012   Procedure: LAPAROSCOPIC CHOLECYSTECTOMY;  Surgeon: Fabio Bering, MD;  Location: [facility];  Service: General;  Laterality: N/A;  . INTRAUTERINE DEVICE INSERTION  2011  . MYRINGOTOMY  2011   right    Obstetrical History: OB History    Gravida Para Term Preterm AB Living   3 2 2     2    SAB TAB Ectopic Multiple Live Births         0 2      Social History: Social History   Social History  . Marital status: Married    Spouse name: N/A  . Number of children: N/A  . Years of education: N/A   Social History Main Topics  . Smoking status: Never Smoker  . Smokeless tobacco: Never Used  . Alcohol use No  . Drug use: No  . Sexual activity: Not Currently    Birth control/ protection: None   Other Topics Concern  . None   Social History Narrative  . None    Family History: Family History  Problem Relation Age of Onset  . Diabetes Paternal Grandfather   . Heart attack Paternal Grandfather    . Other Paternal Grandfather     aneursym  . Diabetes Paternal Grandmother   . Cancer Paternal Grandmother     breast,lung  . Other Maternal Grandmother     heart issues; has stent; has a eating disorder  . COPD Father   . Other Father     heart issues  . COPD Mother   . Mental illness Sister   . Autism Other     Allergies: Allergies  Allergen Reactions  . Amoxicillin     Reaction: INEFFECTIVE  . Avelox [Moxifloxacin Hcl In Nacl] Diarrhea and Nausea And Vomiting  . Cefdinir     ineffective  . Doxycycline Nausea And Vomiting  . Latex Rash    Prescriptions Prior to Admission  Medication Sig Dispense Refill Last Dose  . acyclovir (ZOVIRAX) 400 MG tablet Take 1 tablet (400 mg total) by mouth 3 (three) times daily. 90 tablet 2 Past Week at Unknown time  . albuterol (PROVENTIL HFA;VENTOLIN HFA) 108 (90 Base) MCG/ACT inhaler Inhale 2 puffs into the lungs every 6 (six) hours as needed for wheezing or shortness of breath.   09/02/2016 at Unknown time  . amoxicillin-clavulanate (AUGMENTIN) 875-125 MG tablet Take 1 tablet by mouth 2 (two) times daily. X 7 days 14 tablet 0 09/02/2016 at  Unknown time  . cetirizine (ZYRTEC) 10 MG tablet Take 10 mg by mouth daily.   09/02/2016 at Unknown time  . Diphenhydramine-PE-APAP (COLD CONTROL PE COLD/FLU MED PO) Take by mouth.   09/02/2016 at Unknown time  . fluticasone (FLONASE) 50 MCG/ACT nasal spray Place 1 spray into both nostrils daily. 16 g 0 Past Week at Unknown time  . Prenatal Vit-Fe Fumarate-FA (MULTIVITAMIN-PRENATAL) 27-0.8 MG TABS tablet Take 1 tablet by mouth daily at 12 noon.   09/02/2016 at Unknown time  . butalbital-acetaminophen-caffeine (FIORICET, ESGIC) 50-325-40 MG tablet Take 1-2 tablets by mouth every 6 (six) hours as needed for headache. (Patient not taking: Reported on 09/02/2016) 20 tablet 0 Not Taking  . cyclobenzaprine (FLEXERIL) 10 MG tablet Take 1 tablet (10 mg total) by mouth 2 (two) times daily as needed for muscle  spasms. (Patient not taking: Reported on 09/02/2016) 20 tablet 0 Not Taking  . metroNIDAZOLE (FLAGYL) 500 MG tablet Take 1 tablet (500 mg total) by mouth 2 (two) times daily. X 7 days. No sex or alcohol while taking (Patient not taking: Reported on 09/02/2016) 14 tablet 0 Not Taking     Review of Systems   All systems reviewed and negative except as stated in HPI  Blood pressure 131/75, pulse 86, temperature 97.9 F (36.6 C), resp. rate 18, height 5\' 4"  (1.626 m), weight 110.5 kg (243 lb 9.6 oz), not currently breastfeeding. General appearance: alert, cooperative and no distress Lungs: clear to auscultation bilaterally Heart: regular rate and rhythm Abdomen: soft, non-tender; bowel sounds normal Extremities: No calf swelling or tenderness Presentation: cephalic by ultrasound Fetal monitoring: baseline 120-130, moderate variability, accelerations present, no decelerations Uterine activity: none     Prenatal labs: ABO, Rh: O/Negative/-- (03/21 1227) Antibody: Negative (08/03 0904) Rubella: Immune RPR: Non Reactive (08/03 0904)  HBsAg: Negative (03/21 1227)  HIV: Non Reactive (08/03 0904)  GBS: Negative (10/03 0000)  2 hr Glucola: 75/101/67 Genetic screening:  NT/IT neg, CF screen declined Anatomy US: Normal female  Prenatal Transfer Tool  Maternal Diabetes: No Genetic Screening: Normal Maternal Ultrasounds/Referrals: Normal Fetal Ultrasounds or other Referrals:  None Maternal Substance Abuse:  No Significant Maternal Medications:  Meds include: Other: Acyclovir Significant Maternal Lab Results: Lab values include: Group B Strep negative, Rh negative  Results for orders placed or performed in visit on 09/02/16 (from the past 24 hour(s))  POCT urinalysis dipstick   Collection Time: 09/02/16 11:42 AM  Result Value Ref Range   Color, UA     Clarity, UA     Glucose, UA neg    Bilirubin, UA     Ketones, UA neg    Spec Grav, UA     Blood, UA neg    pH, UA     Protein, UA  neg    Urobilinogen, UA     Nitrite, UA neg    Leukocytes, UA Trace (A) Negative    Patient Active Problem List   Diagnosis Date Noted  . Supervision of normal pregnancy 02/09/2016  . Short interval between pregnancies affecting pregnancy, antepartum 02/09/2016  . Rh negative state in antepartum period 02/09/2016  . H/O Postpartum hypertension 10/19/2015  . Rupture of membranes with clear amniotic fluid 09/24/2015  . Rh sensitization   . 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 GERD (gastroesophageal reflux disease) 02/09/2015  . Constipation 02/09/2015  . Heartburn 01/21/2015  . Vertigo 01/21/2015  . 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  . Chronic low back pain 09/17/2013  . Depression with anxiety  02/11/2013  . ADHD (attention deficit hyperactivity disorder) 02/11/2013    Assessment: Rainbow Salman is a 25 y.o. G3P2002 at [redacted]w[redacted]d here for SROM (clear) at 2300.  #Labor: SROM (clear at 2300). Not contracting. Will give Cytotec PO. #Pain: Unsure if she wants epidural. Wants to see how it goes. #FWB: Category I #ID: GBS neg, HSV-2 on Acyclovir without lesions #MOF: Breast #MOC: Mirena #Circ:  N/a- girl  Jinny Blossom Mayo 09/03/2016, 1:51 AM  I have participated in the care of this patient and I agree with the above. Cam Hai CNM 9:53 AM 09/03/2016

## 2016-09-03 NOTE — Anesthesia Preprocedure Evaluation (Signed)
Anesthesia Evaluation  Patient identified by MRN, date of birth, ID band Patient awake    Reviewed: Allergy & Precautions, H&P , NPO status , Patient's Chart, lab work & pertinent test results  Airway Mallampati: II  TM Distance: >3 FB Neck ROM: full    Dental no notable dental hx.    Pulmonary    Pulmonary exam normal        Cardiovascular Normal cardiovascular exam     Neuro/Psych negative neurological ROS     GI/Hepatic Neg liver ROS,   Endo/Other  Morbid obesity  Renal/GU negative Renal ROS     Musculoskeletal   Abdominal (+) + obese,   Peds  Hematology negative hematology ROS (+)   Anesthesia Other Findings   Reproductive/Obstetrics (+) Pregnancy                             Anesthesia Physical Anesthesia Plan  ASA: III  Anesthesia Plan: Epidural   Post-op Pain Management:    Induction:   Airway Management Planned:   Additional Equipment:   Intra-op Plan:   Post-operative Plan:   Informed Consent: I have reviewed the patients History and Physical, chart, labs and discussed the procedure including the risks, benefits and alternatives for the proposed anesthesia with the patient or authorized representative who has indicated his/her understanding and acceptance.     Plan Discussed with:   Anesthesia Plan Comments:         Anesthesia Quick Evaluation

## 2016-09-04 LAB — CBC
HCT: 32.7 % — ABNORMAL LOW (ref 36.0–46.0)
Hemoglobin: 10.7 g/dL — ABNORMAL LOW (ref 12.0–15.0)
MCH: 25.8 pg — AB (ref 26.0–34.0)
MCHC: 32.7 g/dL (ref 30.0–36.0)
MCV: 79 fL (ref 78.0–100.0)
PLATELETS: 218 10*3/uL (ref 150–400)
RBC: 4.14 MIL/uL (ref 3.87–5.11)
RDW: 14.1 % (ref 11.5–15.5)
WBC: 11.2 10*3/uL — ABNORMAL HIGH (ref 4.0–10.5)

## 2016-09-04 MED ORDER — IBUPROFEN 600 MG PO TABS: 600.0000 mg | ORAL_TABLET | Freq: Four times a day (QID) | ORAL | 0 refills | Status: DC

## 2016-09-04 MED ORDER — DOCUSATE SODIUM 100 MG PO CAPS: 100.0000 mg | ORAL_CAPSULE | Freq: Two times a day (BID) | ORAL | 0 refills | Status: DC

## 2016-09-04 NOTE — Progress Notes (Signed)
CM / UR chart review completed.  

## 2016-09-04 NOTE — Lactation Note (Signed)
This note was copied from a baby's chart. Lactation Consultation Note  Patient Name: Girl Delbert PhenixRhyanna Brosnahan ZOXWR'UToday's Date: 09/04/2016  Per RN Mom is only bottle/formula feeding.    Maternal Data    Feeding    LATCH Score/Interventions                      Lactation Tools Discussed/Used     Consult Status      Alfred LevinsGranger, Yonathan Perrow Ann 09/04/2016, 3:34 PM

## 2016-09-04 NOTE — Discharge Instructions (Signed)

## 2016-09-04 NOTE — Discharge Summary (Signed)
OB Discharge Summary  Patient Name: Jasmine Maynard DOB: 1991-01-29 MRN: [id number]  Date of admission: 09/02/2016 Delivering MD: Hermina Staggers   Date of discharge: 09/04/2016  Admitting diagnosis: 39 weeks water breaking Intrauterine pregnancy: [redacted]w[redacted]d     Secondary diagnosis:Active Problems:   Postpartum care following vaginal delivery  Additional problems:none     Discharge diagnosis: Term Pregnancy Delivered                                                                     Post partum procedures:none  Augmentation: none  Complications: None  Hospital course:  Onset of Labor With Vaginal Delivery     25 y.o. yo G3P3003 at [redacted]w[redacted]d was admitted in Active Labor on 09/02/2016. Patient had an uncomplicated labor course as follows:  Membrane Rupture Time/Date: 9:00 AM ,09/02/2016   Intrapartum Procedures: Episiotomy: None [1]                                         Lacerations:  Periurethral [8]  Patient had a delivery of a Viable infant. 09/03/2016  Information for the patient's newborn:  Emmarae, Cowdery [[id number]]  Delivery Method: Vaginal, Spontaneous Delivery (Filed from Delivery Summary)    Pateint had an uncomplicated postpartum course.  She is ambulating, tolerating a regular diet, passing flatus, and urinating well. Patient is discharged home in stable condition on 09/04/16.    Physical exam Vitals:   09/03/16 1650 09/03/16 2043 09/04/16 0340 09/04/16 0544  BP: 131/69 124/74 120/85 124/76  Pulse: 70 83 69 68  Resp: 18 18 18 18   Temp: 98.2 F (36.8 C) 98.4 F (36.9 C) 98.4 F (36.9 C) 98 F (36.7 C)  TempSrc: Oral Oral Oral Oral  SpO2:  99% 99%   Weight:      Height:       General: alert, cooperative and no distress Lochia: appropriate Uterine Fundus: firm Incision: N/A DVT Evaluation: No evidence of DVT seen on physical exam. Labs: Lab Results  Component Value Date   WBC 11.2 (H) 09/04/2016   HGB 10.7 (L) 09/04/2016   HCT 32.7 (L)  09/04/2016   MCV 79.0 09/04/2016   PLT 218 09/04/2016   CMP Latest Ref Rng & Units 08/10/2016  Glucose 65 - 99 mg/dL 578(I)  BUN 6 - 20 mg/dL 6  Creatinine 6.96 - 2.95 mg/dL 2.84  Sodium 132 - 440 mmol/L 135  Potassium 3.5 - 5.1 mmol/L 3.2(L)  Chloride 101 - 111 mmol/L 107  CO2 22 - 32 mmol/L 19(L)  Calcium 8.9 - 10.3 mg/dL 8.3(L)  Total Protein 6.5 - 8.1 g/dL 6.9  Total Bilirubin 0.3 - 1.2 mg/dL 0.8  Alkaline Phos 38 - 126 U/L 71  AST 15 - 41 U/L 18  ALT 14 - 54 U/L 12(L)    Discharge instruction: per After Visit Summary and "Baby and Me Booklet".  After Visit Meds:    Medication List    STOP taking these medications   acyclovir 400 MG tablet Commonly known as:  ZOVIRAX   butalbital-acetaminophen-caffeine 50-325-40 MG tablet Commonly known as:  FIORICET, ESGIC   cetirizine 10 MG  tablet Commonly known as:  ZYRTEC   COLD CONTROL PE COLD/FLU MED PO   cyclobenzaprine 10 MG tablet Commonly known as:  FLEXERIL   fluticasone 50 MCG/ACT nasal spray Commonly known as:  FLONASE   metroNIDAZOLE 500 MG tablet Commonly known as:  FLAGYL     TAKE these medications   albuterol 108 (90 Base) MCG/ACT inhaler Commonly known as:  PROVENTIL HFA;VENTOLIN HFA Inhale 2 puffs into the lungs every 6 (six) hours as needed for wheezing or shortness of breath.   amoxicillin-clavulanate 875-125 MG tablet Commonly known as:  AUGMENTIN Take 1 tablet by mouth 2 (two) times daily. X 7 days   docusate sodium 100 MG capsule Commonly known as:  COLACE Take 1 capsule (100 mg total) by mouth 2 (two) times daily.   ibuprofen 600 MG tablet Commonly known as:  ADVIL,MOTRIN Take 1 tablet (600 mg total) by mouth every 6 (six) hours.   multivitamin-prenatal 27-0.8 MG Tabs tablet Take 1 tablet by mouth daily at 12 noon.       Diet: routine diet  Activity: Advance as tolerated. Pelvic rest for 6 weeks.   Outpatient follow up:6 weeks Follow up Appt:Future Appointments Date Time Provider  Department Center  09/09/2016 12:00 PM Cheral MarkerKimberly R Booker, CNM [facility]   Follow up visit: No Follow-up on file.  Postpartum contraception: Undecided  Newborn Data: Live born female  Birth Weight: 7 lb 13 oz (3545 g) APGAR: 9, 9  Baby Feeding: Breast Disposition:home with mother   09/04/2016 Jasmine Maynard, CNM

## 2016-09-04 NOTE — Anesthesia Postprocedure Evaluation (Signed)
Anesthesia Post Note  Patient: Jasmine Maynard  Procedure(s) Performed: * No procedures listed *  Patient location during evaluation: Mother Baby Anesthesia Type: Epidural Level of consciousness: awake and alert Pain management: satisfactory to patient Vital Signs Assessment: post-procedure vital signs reviewed and stable Respiratory status: respiratory function stable Cardiovascular status: stable Postop Assessment: no headache, no backache, epidural receding, patient able to bend at knees, no signs of nausea or vomiting and adequate PO intake Anesthetic complications: no     Last Vitals:  Vitals:   09/04/16 0340 09/04/16 0544  BP: 120/85 124/76  Pulse: 69 68  Resp: 18 18  Temp: 36.9 C 36.7 C    Last Pain:  Vitals:   09/04/16 0551  TempSrc:   PainSc: 0-No pain   Pain Goal: Patients Stated Pain Goal: 4 (09/03/16 1835)               Karleen DolphinFUSSELL,Jeorge Reister

## 2016-09-04 NOTE — Progress Notes (Signed)
Patient ID: Jasmine Maynard, female   DOB: 06/16/1991, 25 y.o.   MRN: [id number]   POSTPARTUM PROGRESS NOTE  Post Partum Day #1 Subjective:  Jasmine Maynard is a 25 y.o. G3P3003 7813w1d s/p SVD.  No acute events overnight.  Pt denies problems with ambulating, voiding or po intake.  She denies nausea or vomiting.  Pain is well controlled.  She has had flatus. She has not had bowel movement.  Lochia Minimal.   Objective: Blood pressure 124/76, pulse 68, temperature 98 F (36.7 C), temperature source Oral, resp. rate 18, height 5\' 4"  (1.626 m), weight 243 lb 9.6 oz (110.5 kg), SpO2 99 %, unknown if currently breastfeeding.  Physical Exam:  General: alert, cooperative and no distress Lochia:normal flow Chest: CTAB Heart: RRR no m/r/g Abdomen: +BS, soft, nontender,  Uterine Fundus: firm, below umbilicus DVT Evaluation: No calf swelling or tenderness Extremities: Trace edema   Recent Labs  09/03/16 0125 09/04/16 0532  HGB 11.0* 10.7*  HCT 32.6* 32.7*    Assessment/Plan:  ASSESSMENT: Jasmine Maynard is a 25 y.o. [id number] 6713w1d s/p SVD Acute sinusitis  Possible d/c home today if baby can be discharged at 24 hours Rhogam if indicated (Rh Neg) Continue Augmentin for total 7 days Bottle feeding Contraception: Mirena   LOS: 1 day   Jen MowElizabeth Mumaw, DO 09/04/2016, 8:48 AM

## 2016-09-07 LAB — TYPE AND SCREEN
ABO/RH(D): O NEG
Antibody Screen: POSITIVE
DAT, IGG: NEGATIVE
UNIT DIVISION: 0
Unit division: 0

## 2016-09-09 ENCOUNTER — Encounter: Payer: Medicaid Other | Admitting: Women's Health

## 2016-10-17 ENCOUNTER — Ambulatory Visit: Payer: Medicaid Other | Admitting: Women's Health

## 2016-10-25 ENCOUNTER — Ambulatory Visit: Payer: Medicaid Other | Admitting: Advanced Practice Midwife

## 2016-11-03 ENCOUNTER — Ambulatory Visit: Payer: Medicaid Other | Admitting: Advanced Practice Midwife

## 2016-11-03 ENCOUNTER — Encounter: Payer: Self-pay | Admitting: *Deleted

## 2016-12-09 ENCOUNTER — Encounter: Payer: Self-pay | Admitting: Nurse Practitioner

## 2018-05-25 ENCOUNTER — Emergency Department (HOSPITAL_COMMUNITY): Payer: Medicaid Other

## 2018-05-25 ENCOUNTER — Other Ambulatory Visit: Payer: Self-pay

## 2018-05-25 ENCOUNTER — Encounter (HOSPITAL_COMMUNITY): Payer: Self-pay | Admitting: Emergency Medicine

## 2018-05-25 ENCOUNTER — Emergency Department (HOSPITAL_COMMUNITY)
Admission: EM | Admit: 2018-05-25 | Discharge: 2018-05-26 | Disposition: A | Discharge status: Home / Self Care | Payer: Medicaid Other | Attending: Emergency Medicine | Admitting: Emergency Medicine

## 2018-05-25 DIAGNOSIS — F909 Attention-deficit hyperactivity disorder, unspecified type: Secondary | ICD-10-CM | POA: Insufficient documentation

## 2018-05-25 DIAGNOSIS — Z79899 Other long term (current) drug therapy: Secondary | ICD-10-CM | POA: Insufficient documentation

## 2018-05-25 DIAGNOSIS — J45909 Unspecified asthma, uncomplicated: Secondary | ICD-10-CM | POA: Insufficient documentation

## 2018-05-25 DIAGNOSIS — M545 Low back pain, unspecified: Secondary | ICD-10-CM

## 2018-05-25 DIAGNOSIS — Z91040 Latex allergy status: Secondary | ICD-10-CM | POA: Insufficient documentation

## 2018-05-25 LAB — BASIC METABOLIC PANEL
Anion gap: 7 (ref 5–15)
BUN: 8 mg/dL (ref 6–20)
CHLORIDE: 107 mmol/L (ref 98–111)
CO2: 23 mmol/L (ref 22–32)
Calcium: 8.8 mg/dL — ABNORMAL LOW (ref 8.9–10.3)
Creatinine, Ser: 0.65 mg/dL (ref 0.44–1.00)
GFR calc non Af Amer: >60 mL/min (ref >60)
Glucose, Bld: 88 mg/dL (ref 70–99)
POTASSIUM: 3.8 mmol/L (ref 3.5–5.1)
SODIUM: 137 mmol/L (ref 135–145)

## 2018-05-25 LAB — URINALYSIS, ROUTINE W REFLEX MICROSCOPIC
Bilirubin Urine: NEGATIVE
GLUCOSE, UA: NEGATIVE mg/dL
KETONES UR: NEGATIVE mg/dL
Leukocytes, UA: NEGATIVE
NITRITE: NEGATIVE
PH: 6 (ref 5.0–8.0)
Protein, ur: NEGATIVE mg/dL
Specific Gravity, Urine: 1.015 (ref 1.005–1.030)

## 2018-05-25 LAB — I-STAT BETA HCG BLOOD, ED (MC, WL, AP ONLY): I-stat hCG, quantitative: <5 m[IU]/mL (ref <5)

## 2018-05-25 LAB — CBC
HCT: 41.4 % (ref 36.0–46.0)
HEMOGLOBIN: 13.6 g/dL (ref 12.0–15.0)
MCH: 28.7 pg (ref 26.0–34.0)
MCHC: 32.9 g/dL (ref 30.0–36.0)
MCV: 87.3 fL (ref 78.0–100.0)
PLATELETS: 233 10*3/uL (ref 150–400)
RBC: 4.74 MIL/uL (ref 3.87–5.11)
RDW: 12.2 % (ref 11.5–15.5)
WBC: 6.5 10*3/uL (ref 4.0–10.5)

## 2018-05-25 MED ORDER — OXYCODONE-ACETAMINOPHEN 5-325 MG PO TABS
1.0000 | ORAL_TABLET | Freq: Once | ORAL | Status: AC
  Administered 2018-05-25: 1 via ORAL
  Filled 2018-05-25: qty 1

## 2018-05-25 MED ORDER — ONDANSETRON HCL 4 MG/2ML IJ SOLN
4.0000 mg | Freq: Once | INTRAMUSCULAR | Status: AC
  Administered 2018-05-25: 4 mg via INTRAVENOUS
  Filled 2018-05-25: qty 2

## 2018-05-25 MED ORDER — MORPHINE SULFATE (PF) 4 MG/ML IV SOLN
4.0000 mg | Freq: Once | INTRAVENOUS | Status: AC
  Administered 2018-05-25: 4 mg via INTRAVENOUS
  Filled 2018-05-25: qty 1

## 2018-05-25 NOTE — ED Notes (Signed)
Pt. To CT via stretcher. 

## 2018-05-25 NOTE — ED Triage Notes (Signed)
Pt c/o L flank pain radiating around to LLQ onset today, pt denies urinary s/s.

## 2018-05-25 NOTE — ED Provider Notes (Signed)
Patient placed in Quick Look pathway, seen and evaluated   Chief Complaint: left flank pain and pain to the left lower abdomen  HPI:   Jasmine Maynard is a 27 y.o. female who presents to the ED with pain to the left flank and left lower abdomen that started at 4pm today. Patient c/o nausea with the waves of pain. The pain increased with movement. Patient with hx of ruptured ovarian cyst but this feels different. Patient rates pain as 9/10 with ambulation. Patient reports frequent urination but denies pain.  ROS: General: chills, no fever  M/S: left lower back pain  GI: nausea, left side abdominal pain  Physical Exam:  BP 130/77 (BP Location: Right Arm)   Pulse 90   Temp 98.3 F (36.8 C) (Oral)   Resp 18   Ht 5\' 4"  (1.626 m)   LMP 04/17/2018   SpO2 100%   BMI 41.81 kg/m    Gen: No distress  Neuro: Awake and Alert  Skin: Warm and dry  Abdomen: soft nontender with palpation  Back: tender with palpation left lower lumbar area and over left sciatic nerve   Initiation of care has begun. The patient has been counseled on the process, plan, and necessity for staying for the completion/evaluation, and the remainder of the medical screening examination    Janne Napoleoneese, Hope M, NP 05/25/18 2019    Blane OharaZavitz, Joshua, MD 05/25/18 2352

## 2018-05-25 NOTE — ED Provider Notes (Signed)
[facility] EMERGENCY DEPARTMENT Provider Note   CSN: [id number] Arrival date & time: 05/25/18  1926     History   Chief Complaint Chief Complaint  Patient presents with  . Flank Pain    HPI Jasmine Maynard is a 27 y.o. female with a hx of ADHD, asthma, depression, osteoarthritis, generative disc disease and compression fractures of her lumbar spine presents to the Emergency Department complaining of gradual, persistent, progressively worsening left flank pain onset 4pm.  Pt reports the pain is coming in waves and aassociated with nausea without vomiting.  Pt reports the pain ranges from 3-9/10 and radiates around her left side but not into her abdomen.  It does not radiate into her buttock or down her leg.  Pt reports walking makes makes it worse.  Position makes it some better.  Pt denies hx of kidney stones.  Pt denies chronic back pain, numbness or tingling in her legs.  Pt denies urinary frequency, urgency, hematuria, dysuria.  Pt does report a hx a ovarian cyst in 2010, but reports this pain is different.  Pt denies fever, chills, headache, neck pain, chest pain, shortness of breath, abdominal pain, weakness, dizziness, syncope, dysuria, hematuria.  LMP: June 1st week.  Pt missed July's menses.  Pt's husband has a vasectomy.     The history is provided by the patient and medical records. No language interpreter was used.    Past Medical History:  Diagnosis Date  . ADHD (attention deficit hyperactivity disorder)   . Asthma   . DDD (degenerative disc disease)    in back  . Depression    fine problem  . Heartburn   . Herpes   . Perennial allergic rhinitis   . Pregnant 01/21/2015  . Pregnant 01/26/2016  . Vertigo     Patient Active Problem List   Diagnosis Date Noted  . Postpartum care following vaginal delivery 09/03/2016  . Supervision of normal pregnancy 02/09/2016  . Short interval between pregnancies affecting pregnancy, antepartum 02/09/2016  . Rh  negative state in antepartum period 02/09/2016  . H/O Postpartum hypertension 10/19/2015  . Rupture of membranes with clear amniotic fluid 09/24/2015  . Rh sensitization   . 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 GERD (gastroesophageal reflux disease) 02/09/2015  . Constipation 02/09/2015  . Heartburn 01/21/2015  . Vertigo 01/21/2015  . 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  . Chronic low back pain 09/17/2013  . Depression with anxiety 02/11/2013  . ADHD (attention deficit hyperactivity disorder) 02/11/2013    Past Surgical History:  Procedure Laterality Date  . CHOLECYSTECTOMY  02/13/2012   Procedure: LAPAROSCOPIC CHOLECYSTECTOMY;  Surgeon: Fabio Bering, MD;  Location: [facility];  Service: General;  Laterality: N/A;  . INTRAUTERINE DEVICE INSERTION  2011  . MYRINGOTOMY  2011   right     OB History    Gravida  3   Para  3   Term  3   Preterm      AB      Living  3     SAB      TAB      Ectopic      Multiple  0   Live Births  3            Home Medications    Prior to Admission medications   Medication Sig Start Date End Date Taking? Authorizing Provider  albuterol (PROVENTIL HFA;VENTOLIN HFA) 108 (90 Base) MCG/ACT inhaler Inhale 2 puffs into the lungs every 6 (six)  hours as needed for wheezing or shortness of breath.   Yes [provider]  methocarbamol (ROBAXIN) 500 MG tablet Take 1 tablet (500 mg total) by mouth 2 (two) times daily. 05/26/18   Char Feltman, Dahlia Client, PA-C  naproxen (NAPROSYN) 500 MG tablet Take 1 tablet (500 mg total) by mouth 2 (two) times daily with a meal. 05/26/18   Vihana Kydd, Dahlia Client, PA-C  dicyclomine (BENTYL) 20 MG tablet Take 1 tablet (20 mg total) by mouth every 6 (six) hours as needed (abdominal cramping). 01/23/12 02/09/12  Samuel Jester, DO    Family History Family History  Problem Relation Age of Onset  . Diabetes Paternal Grandfather   . Heart attack Paternal Grandfather   . Other Paternal Grandfather         aneursym  . Diabetes Paternal Grandmother   . Cancer Paternal Grandmother        breast,lung  . Other Maternal Grandmother        heart issues; has stent; has a eating disorder  . COPD Father   . Other Father        heart issues  . COPD Mother   . Mental illness Sister   . Autism Other     Social History Social History   Tobacco Use  . Smoking status: Never Smoker  . Smokeless tobacco: Never Used  Substance Use Topics  . Alcohol use: No  . Drug use: No     Allergies   Amoxicillin; Avelox [moxifloxacin hcl in nacl]; Cefdinir; Doxycycline; and Latex   Review of Systems Review of Systems  Constitutional: Negative for appetite change, diaphoresis, fatigue, fever and unexpected weight change.  HENT: Negative for mouth sores.   Eyes: Negative for visual disturbance.  Respiratory: Negative for cough, chest tightness, shortness of breath and wheezing.   Cardiovascular: Negative for chest pain.  Gastrointestinal: Positive for nausea. Negative for abdominal pain, constipation, diarrhea and vomiting.  Endocrine: Negative for polydipsia, polyphagia and polyuria.  Genitourinary: Positive for flank pain. Negative for dysuria, frequency, hematuria and urgency.  Musculoskeletal: Positive for back pain. Negative for neck stiffness.  Skin: Negative for rash.  Allergic/Immunologic: Negative for immunocompromised state.  Neurological: Negative for syncope, light-headedness and headaches.  Hematological: Does not bruise/bleed easily.  Psychiatric/Behavioral: Negative for sleep disturbance. The patient is not nervous/anxious.      Physical Exam Updated Vital Signs BP 130/77 (BP Location: Right Arm)   Pulse 90   Temp 98.3 F (36.8 C) (Oral)   Resp 18   Ht 5\' 4"  (1.626 m)   LMP 04/17/2018   SpO2 100%   BMI 41.81 kg/m   Physical Exam  Constitutional: She appears well-developed and well-nourished. No distress.  Awake, alert, nontoxic appearance  HENT:  Head: Normocephalic and  atraumatic.  Mouth/Throat: Oropharynx is clear and moist. No oropharyngeal exudate.  Eyes: Conjunctivae are normal. No scleral icterus.  Neck: Normal range of motion. Neck supple.  Full ROM without pain  Cardiovascular: Normal rate, regular rhythm and intact distal pulses.  Pulmonary/Chest: Effort normal and breath sounds normal. No respiratory distress. She has no wheezes.  Equal chest expansion  Abdominal: Soft. Bowel sounds are normal. She exhibits no distension and no mass. There is no tenderness. There is CVA tenderness (mild, left). There is no rigidity, no rebound and no guarding.  Musculoskeletal: Normal range of motion. She exhibits no edema.       Lumbar back: She exhibits tenderness.       Back:  Full range of motion of the  T-spine and L-spine No midline tenderness to the  T-spine or L-spine Tenderness to palpation of the left paraspinous muscles of the L-spine and over the SI joint without radiation.    Lymphadenopathy:    She has no cervical adenopathy.  Neurological: She is alert.  Speech is clear and goal oriented, follows commands Normal 5/5 strength in upper and lower extremities bilaterally including dorsiflexion and plantar flexion, strong and equal grip strength Sensation normal to light and sharp touch Moves extremities without ataxia, coordination intact Normal gait Normal balance No Clonus  Skin: Skin is warm and dry. No rash noted. She is not diaphoretic. No erythema.  Psychiatric: She has a normal mood and affect. Her behavior is normal.  Nursing note and vitals reviewed.    ED Treatments / Results  Labs (all labs ordered are listed, but only abnormal results are displayed) Labs Reviewed  URINALYSIS, ROUTINE W REFLEX MICROSCOPIC - Abnormal; Notable for the following components:      Result Value   Hgb urine dipstick SMALL (*)    Bacteria, UA RARE (*)    All other components within normal limits  BASIC METABOLIC PANEL - Abnormal; Notable for the  following components:   Calcium 8.8 (*)    All other components within normal limits  CBC  I-STAT BETA HCG BLOOD, ED ([facility], [facility], [facility] ONLY)     Radiology Ct L-spine No Charge  Result Date: 05/26/2018 CLINICAL DATA:  Low back pain.  No injury reported. EXAM: CT LUMBAR SPINE WITHOUT CONTRAST TECHNIQUE: Multidetector CT imaging of the lumbar spine was performed without intravenous contrast administration. Multiplanar CT image reconstructions were also generated. COMPARISON:  None. FINDINGS: Segmentation: 5 lumbar type vertebrae. Alignment: Normal. Vertebrae: No acute fracture or focal pathologic process. Paraspinal and other soft tissues: Negative. Disc levels: Intervertebral disc space heights are preserved. No definite encroachment upon the central canal on noncontrast imaging. Incidental note of intrarenal stones on the right kidney. IMPRESSION: Normal alignment of the lumbar spine. No acute displaced fractures identified. Intervertebral disc space heights are preserved. Electronically Signed   By: Burman Nieves M.D.   On: 05/26/2018 00:03   Ct Renal Stone Study  Result Date: 05/25/2018 CLINICAL DATA:  Left flank pain and back pain with nausea beginning at 4 p.m. today. No known injury. EXAM: CT ABDOMEN AND PELVIS WITHOUT CONTRAST TECHNIQUE: Multidetector CT imaging of the abdomen and pelvis was performed following the standard protocol without IV contrast. COMPARISON:  None. FINDINGS: Lower chest: Lung bases are clear. Hepatobiliary: No focal liver abnormality is seen. Status post cholecystectomy. No biliary dilatation. Pancreas: Unremarkable. No pancreatic ductal dilatation or surrounding inflammatory changes. Spleen: Normal in size without focal abnormality. Adrenals/Urinary Tract: No adrenal gland nodules. 3 mm stone in the midpole right kidney. No hydronephrosis or hydroureter. No left renal or ureteral stones. Bladder is decompressed. No bladder stones identified. Stomach/Bowel: Stomach, small  bowel, and colon are not abnormally distended. No wall thickening is appreciated. No inflammatory infiltration. Appendix is normal. Vascular/Lymphatic: No significant vascular findings are present. No enlarged abdominal or pelvic lymph nodes. Reproductive: Uterus and bilateral adnexa are unremarkable. Other: No abdominal wall hernia or abnormality. No abdominopelvic ascites. Musculoskeletal: No acute or significant osseous findings. IMPRESSION: 1. 3 mm nonobstructing stone in the right kidney. 2. No ureteral stone or obstruction on the left. 3. No evidence of bowel obstruction or inflammation. Electronically Signed   By: Burman Nieves M.D.   On: 05/25/2018 23:56    Procedures Procedures (including critical care  time)  Medications Ordered in ED Medications  oxyCODONE-acetaminophen (PERCOCET/ROXICET) 5-325 MG per tablet 1 tablet (1 tablet Oral Given 05/25/18 2011)  morphine 4 MG/ML injection 4 mg (4 mg Intravenous Given 05/25/18 2323)  ondansetron (ZOFRAN) injection 4 mg (4 mg Intravenous Given 05/25/18 2322)  methocarbamol (ROBAXIN) 500 mg in dextrose 5 % 50 mL IVPB (0 mg Intravenous Stopped 05/26/18 0230)  ketorolac (TORADOL) 30 MG/ML injection 30 mg (30 mg Intravenous Given 05/26/18 0105)     Initial Impression / Assessment and Plan / ED Course  I have reviewed the triage vital signs and the nursing notes.  Pertinent labs & imaging results that were available during my care of the patient were reviewed by me and considered in my medical decision making (see chart for details).  Clinical Course as of May 26 816  Fri May 25, 2018  2336 Small amount of hemoglobin noted.  Hgb urine dipstick(!): SMALL [HM]  2336 Normal  Creatinine: 0.65 [HM]  Sat May 26, 2018  [id number] Patient ambulates with a gait and significantly improved pain.  She reports that lying flat on her back still makes her back and flank hurt but that walking no longer aggravates the pain.   [HM]    Clinical Course User Index [HM]  Rayvon Brandvold, Dahlia ClientHannah, [place]PA-C    Patient presents with left-sided flank and low back pain.  She reports a history of degenerative disc disease.  She has no history of nephrolithiasis.  Patient does have mild CVA tenderness along with left-sided L-spine tenderness.  Position and palpation seem to make the symptoms worse.  Her colicky pain is more consistent with nephrolithiasis and renal colic however location of the pain and position since give rise to concern for musculoskeletal etiology.  Patient is afebrile without evidence of sirs or sepsis.  Labs are reassuring.  No leukocytosis.  Patient is not pregnant.  Dense of urinary tract infection.  CT renal without evidence of hydronephrosis, nephrolithiasis or stranding around the kidneys.  L-spine films without compression fracture or acute etiology of her back pain.  Patient given Robaxin and Toradol.  This does improve her pain significantly.  She is now able to walk without difficulty or significant pain.  She does report that lying flat on her back continues to create discomfort.  Patient will be discharged home with conservative therapies and close primary care follow-up.  She states understanding and is in agreement with the plan.   Final Clinical Impressions(s) / ED Diagnoses   Final diagnoses:  Low back pain  Acute left-sided low back pain without sciatica    ED Discharge Orders        Ordered    methocarbamol (ROBAXIN) 500 MG tablet  2 times daily     05/26/18 0406    naproxen (NAPROSYN) 500 MG tablet  2 times daily with meals     05/26/18 0406       Drequan Ironside, Dahlia ClientHannah, PA-C 05/26/18 [id number]    Lorre NickAllen, Anthony, MD 05/26/18 1523

## 2018-05-26 MED ORDER — METHOCARBAMOL 1000 MG/10ML IJ SOLN: 500.0000 mg | Freq: Once | INTRAMUSCULAR | Status: DC

## 2018-05-26 MED ORDER — METHOCARBAMOL 1000 MG/10ML IJ SOLN
500.0000 mg | Freq: Once | INTRAVENOUS | Status: AC
  Administered 2018-05-26: 500 mg via INTRAVENOUS
  Filled 2018-05-26: qty 5

## 2018-05-26 MED ORDER — METHOCARBAMOL 500 MG PO TABS: 500.0000 mg | ORAL_TABLET | Freq: Two times a day (BID) | ORAL | 0 refills | Status: DC

## 2018-05-26 MED ORDER — NAPROXEN 500 MG PO TABS: 500.0000 mg | ORAL_TABLET | Freq: Two times a day (BID) | ORAL | 0 refills | Status: DC

## 2018-05-26 MED ORDER — KETOROLAC TROMETHAMINE 30 MG/ML IJ SOLN
30.0000 mg | Freq: Once | INTRAMUSCULAR | Status: AC
  Administered 2018-05-26: 30 mg via INTRAVENOUS
  Filled 2018-05-26: qty 1

## 2018-05-26 NOTE — ED Notes (Signed)
Pt. Ambulated with steady gait.

## 2018-05-26 NOTE — Discharge Instructions (Addendum)
1. Medications: robaxin, naproxyn, usual home medications 2. Treatment: rest, drink plenty of fluids, gentle stretching as discussed, alternate ice and heat 3. Follow Up: Please followup with your primary doctor in 3-5 days for discussion of your diagnoses and further evaluation after today's visit; if you do not have a primary care doctor use the resource guide provided to find one;  Return to the ER for worsening back pain, difficulty walking, loss of bowel or bladder control or other concerning symptoms     

## 2018-05-31 ENCOUNTER — Ambulatory Visit (INDEPENDENT_AMBULATORY_CARE_PROVIDER_SITE_OTHER): Payer: Self-pay | Admitting: Family Medicine

## 2018-05-31 ENCOUNTER — Other Ambulatory Visit: Payer: Self-pay

## 2018-05-31 ENCOUNTER — Encounter: Payer: Self-pay | Admitting: Family Medicine

## 2018-05-31 ENCOUNTER — Ambulatory Visit (HOSPITAL_COMMUNITY)
Admission: RE | Admit: 2018-05-31 | Discharge: 2018-05-31 | Disposition: A | Discharge status: Home / Self Care | Payer: Medicaid Other | Source: Ambulatory Visit | Attending: Family Medicine | Admitting: Family Medicine

## 2018-05-31 ENCOUNTER — Telehealth: Payer: Self-pay | Admitting: Family Medicine

## 2018-05-31 VITALS — BP 112/70 | HR 82 | Temp 98.6°F | Resp 14 | Ht 65.0 in | Wt 226.0 lb

## 2018-05-31 DIAGNOSIS — R102 Pelvic and perineal pain: Secondary | ICD-10-CM

## 2018-05-31 DIAGNOSIS — M545 Low back pain, unspecified: Secondary | ICD-10-CM

## 2018-05-31 MED ORDER — DICLOFENAC SODIUM 75 MG PO TBEC: 75.0000 mg | DELAYED_RELEASE_TABLET | Freq: Two times a day (BID) | ORAL | 0 refills | Status: DC

## 2018-05-31 MED ORDER — KETOROLAC TROMETHAMINE 60 MG/2ML IM SOLN
60.0000 mg | Freq: Once | INTRAMUSCULAR | Status: AC
  Administered 2018-05-31: 60 mg via INTRAMUSCULAR

## 2018-05-31 MED ORDER — METHYLPREDNISOLONE ACETATE 80 MG/ML IJ SUSP
80.0000 mg | Freq: Once | INTRAMUSCULAR | Status: AC
Start: 2018-05-31 — End: 2018-05-31
  Administered 2018-05-31: 80 mg via INTRAMUSCULAR

## 2018-05-31 MED ORDER — CYCLOBENZAPRINE HCL 10 MG PO TABS: 10.0000 mg | ORAL_TABLET | Freq: Three times a day (TID) | ORAL | 0 refills | Status: DC | PRN

## 2018-05-31 NOTE — Telephone Encounter (Signed)
Please call patient and advised that her pelvic ultrasound was completely normal.  Advised her that her ovaries, uterus, and adnexal structures were within normal limits.  The pain that she is having is most likely secondary to an associated with her back pain.  Please advise her to take the medications and keep the follow-up appointment with orthopedics as directed.  Please advise her to call us with any questions or concerns.

## 2018-05-31 NOTE — Progress Notes (Signed)
Patient ID: Jasmine Maynard, female    DOB: 01/10/1991, 27 y.o.   MRN: [id number]  Chief Complaint  Patient presents with  . Back Pain    left side started July 5th  . Abdominal Pain    lower left    Allergies Amoxicillin; Avelox [moxifloxacin hcl in nacl]; Cefdinir; Doxycycline; and Latex  Subjective:   Jasmine Maynard is a 27 y.o. female who presents to [facility] today.  HPI Here as a new patient visit. Works at [employer] currently, was taken out of work by the ED for the week. Seen at [facility] ED on 05/25/2018 and written out of work until the 06/01/2018 due to low back pain.  Patient reports that she has had back pain which started on 05/25/2018 after bending over to blow up and inflatable raft.  She reports that she stood up and went inside b/c it was painful. The pain did not go away and that is when went to the ED. Emergency department notes, labs, and imaging are reviewed today.  CT scan of back and stone study were performed.  She did have a nonobstructing kidney stone on the right side.  Her pain is on the left.  She is still in pain today and does not see how she could possibly go back to work tomorrow.  She reports that she has pain in the left low back, stiff and achey in quality but if tries to walk has a stabbing pain. Sharp pain if try to walk but can walk.  Denies weakness in her legs.  [place] [employer] and could not walk, was in tears b/c of pain. Sometimes of the day can be better and then can get worse.  Pain currently can range from a 1-9 on a scale of 10.  Was given pain medication at the ED and given muscle relaxers. Was sent home with naprosyn and muscle relaxer and it has not helped. Quit taking it yesterday b/c of no help.  Has three children, age 469, 122, 28 year old. Works a busy job.  Reports that she needs to go to work but does not see how she can do it because the pain is severe.  She reports that she is also on day 5 of her menses, usually lasts 5-7 days.   She reports that she is also been having some pain in her left pelvic region.  She reports that her pregnancy test was negative in the emergency department.  Her husband has had a vasectomy.  Her periods are usually monthly.  She reports that this menstrual cycle has been different than her usual ones.  She reports that initially the blood was dark/black and "goopy", usually brown near end of her cycle but usually red.  She reports that she has had tenderness in her left pelvic region.  She believes this is a different pain than it is originating from her back but it is hard to tell.  She is urinating fine.  Denies any vaginal discharge.  Is going to follow-up with her OB/GYN because she is concerned about this.  Denies any dysuria.  She does report that she had some ovarian cysts in the past and has had some similar pain.  She is concerned.  She does report she has had some issues with low back pain in the past but that is usually been in the low back and radiated down her leg.  She reports that she did have numbness and burning pain.  She reports this is completely different.  She reports that because of this pain she has not been able to rest.  She denies any fever, chills, nausea, vomiting, diarrhea.  Her bowel movements are normal.  She has not had any night sweats or weight loss.  Her energy is low but she is in pain and reports that she is not sleeping well.  Back Pain  This is a new problem. The current episode started in the past 7 days. The problem occurs intermittently. The problem has been waxing and waning since onset. The pain is present in the gluteal. The quality of the pain is described as aching, burning and stabbing. Radiates to: on the left side, it radiates around to the groin area. Pain scale: sitting here pain is a 1, but if gets up to walk  then pain 8, if sti up straight pain is a 3. The pain is mild. The symptoms are aggravated by standing, bending, sitting and twisting. Associated  symptoms include pelvic pain. Pertinent negatives include no abdominal pain, bladder incontinence, bowel incontinence, chest pain, dysuria, fever, headaches, numbness, paresis, paresthesias, perianal numbness, tingling or weakness. Risk factors include obesity and lack of exercise. She has tried analgesics, muscle relaxant and NSAIDs for the symptoms. The treatment provided mild relief.    Past Medical History:  Diagnosis Date  . ADHD (attention deficit hyperactivity disorder)   . Asthma   . DDD (degenerative disc disease)    in back  . Depression    fine problem  . Heartburn   . Herpes   . Perennial allergic rhinitis   . Pregnant 01/21/2015  . Pregnant 01/26/2016  . Vertigo     Past Surgical History:  Procedure Laterality Date  . CHOLECYSTECTOMY  02/13/2012   Procedure: LAPAROSCOPIC CHOLECYSTECTOMY;  Surgeon: Fabio Bering, MD;  Location: [facility];  Service: General;  Laterality: N/A;  . INTRAUTERINE DEVICE INSERTION  2011  . MYRINGOTOMY  2011   right    Family History  Problem Relation Age of Onset  . Diabetes Paternal Grandfather   . Heart attack Paternal Grandfather   . Other Paternal Grandfather        aneursym  . Diabetes Paternal Grandmother   . Cancer Paternal Grandmother        breast,lung  . Other Maternal Grandmother        heart issues; has stent; has a eating disorder  . COPD Father   . Other Father        heart issues  . COPD Mother   . Mental illness Sister   . Autism Other      Social History   Socioeconomic History  . Marital status: Married    Spouse name: Not on file  . Number of children: Not on file  . Years of education: Not on file  . Highest education level: Not on file  Occupational History  . Not on file  Social Needs  . Financial resource strain: Not hard at all  . Food insecurity:    Worry: Never true    Inability: Never true  . Transportation needs:    Medical: No    Non-medical: No  Tobacco Use  . Smoking status: Never Smoker    . Smokeless tobacco: Never Used  Substance and Sexual Activity  . Alcohol use: No  . Drug use: No  . Sexual activity: Yes    Partners: Male    Birth control/protection: None    Comment: husband has had  vasectomy  Lifestyle  . Physical activity:    Days per week: 7 days    Minutes per session: 60 min  . Stress: Rather much  Relationships  . Social connections:    Talks on phone: Never    Gets together: Once a week    Attends religious service: More than 4 times per year    Active member of club or organization: No    Attends meetings of clubs or organizations: Never    Relationship status: Married  Other Topics Concern  . Not on file  Social History Narrative  . Not on file    Review of Systems  Constitutional: Negative for activity change, appetite change, chills, diaphoresis, fever and unexpected weight change.  Eyes: Negative for visual disturbance.  Respiratory: Negative for cough, chest tightness, shortness of breath and wheezing.   Cardiovascular: Negative for chest pain, palpitations and leg swelling.  Gastrointestinal: Negative for abdominal pain, bowel incontinence, nausea and vomiting.  Genitourinary: Positive for pelvic pain. Negative for bladder incontinence, decreased urine volume, difficulty urinating, dysuria, frequency, genital sores, hematuria, urgency, vaginal bleeding and vaginal discharge.  Musculoskeletal: Positive for back pain.  Skin: Negative for rash.  Neurological: Negative for dizziness, tingling, tremors, syncope, speech difficulty, weakness, light-headedness, numbness, headaches and paresthesias.  Hematological: Negative for adenopathy.  Psychiatric/Behavioral: Negative for dysphoric mood. The patient is not nervous/anxious.    Current Outpatient Medications on File Prior to Visit  Medication Sig Dispense Refill  . albuterol (PROVENTIL HFA;VENTOLIN HFA) 108 (90 Base) MCG/ACT inhaler Inhale 2 puffs into the lungs every 6 (six) hours as needed for  wheezing or shortness of breath.    . methocarbamol (ROBAXIN) 500 MG tablet Take 1 tablet (500 mg total) by mouth 2 (two) times daily. 20 tablet 0  . naproxen (NAPROSYN) 500 MG tablet Take 1 tablet (500 mg total) by mouth 2 (two) times daily with a meal. 30 tablet 0  . [DISCONTINUED] dicyclomine (BENTYL) 20 MG tablet Take 1 tablet (20 mg total) by mouth every 6 (six) hours as needed (abdominal cramping). 15 tablet 0   No current facility-administered medications on file prior to visit.      Objective:   BP 112/70 (BP Location: Right Arm, Patient Position: Sitting, Cuff Size: Large)   Pulse 82   Temp 98.6 F (37 C) (Oral)   Resp 14   Ht 5\' 5"  (1.651 m)   Wt 226 lb 0.6 oz (102.5 kg)   LMP 05/26/2018   SpO2 96%   BMI 37.62 kg/m   Physical Exam  Constitutional: She is oriented to person, place, and time. She appears well-developed and well-nourished.  Non-toxic appearance. She does not appear ill.  Patient appears that she does not feel well and is in pain.  Pleasant white female.  HENT:  Head: Normocephalic and atraumatic.  Eyes: Pupils are equal, round, and reactive to light. EOM are normal.  Cardiovascular: Normal rate, regular rhythm, normal heart sounds and intact distal pulses.  Pulmonary/Chest: Effort normal and breath sounds normal. No stridor. No respiratory distress.  Abdominal: Soft. Normal appearance and bowel sounds are normal. She exhibits no distension and no ascites. There is tenderness in the left lower quadrant. There is no rigidity, no rebound, no guarding and no CVA tenderness. No hernia.  Obese abdomen.  Neurological: She is alert and oriented to person, place, and time.  Skin: Skin is warm and dry. Capillary refill takes less than 2 seconds. No pallor.  Psychiatric: She has a normal mood  and affect. Her behavior is normal.   Depression screen PHQ 2/9 05/31/2018  Decreased Interest 2  Down, Depressed, Hopeless 2  PHQ - 2 Score 4  Altered sleeping 3  Tired,  decreased energy 3  Change in appetite 2  Feeling bad or failure about yourself  1  Trouble concentrating 3  Moving slowly or fidgety/restless 0  PHQ-9 Score 16  Difficult doing work/chores Somewhat difficult     Assessment and Plan  1. Acute left-sided low back pain without sciatica Patient with acute left-sided low back pain, suspect muscular in etiology.  CT from emergency department reviewed.  Labs from emergency department reviewed. Will give Toradol injection at this time.  Will treat for acute low back pain.  Recommend the patient start the Voltaren as directed tomorrow.  Will place referral to orthopedics for evaluation prior to initiating any physical therapy. Discussed risks of cardiovascular thrombotic events related to NSAIDS. Discussed increased risk of AMI and CVA. Discussed risk of serious GI adverse events including bleeding, ulcers, and perforation. Patient understands risks of this medication.  Patient was counseled concerning worrisome signs and symptoms of low back pain and if those occur to go to the emergency department.  She voiced understanding. - ketorolac (TORADOL) injection 60 mg - methylPREDNISolone acetate (DEPO-MEDROL) injection 80 mg - cyclobenzaprine (FLEXERIL) 10 MG tablet; Take 1 tablet (10 mg total) by mouth 3 (three) times daily as needed for muscle spasms.  Dispense: 30 tablet; Refill: 0 - Ambulatory referral to Orthopedic Surgery - diclofenac (VOLTAREN) 75 MG EC tablet; Take 1 tablet (75 mg total) by mouth 2 (two) times daily.  Dispense: 30 tablet; Refill: 0 -Sedation precautions given for muscle relaxer. Note was given for patient to be out of work for the next week. 2. Pelvic pain Patient reports that she has had ovarian cysts in the past with similar pain.  She would like to follow-up with her OB/GYN for pelvic examination.  She reports she will schedule that today.  We will go ahead and get ultrasound.  Pregnancy test results reviewed from emergency  department.  Patient was counseled concerning worrisome signs and symptoms and if those occur to go to the emergency department. - US PELVIC COMPLETE W TRANSVAGINAL AND TORSION R/O; Future  Return in about 1 month (around 06/28/2018). Aliene Beams, MD 05/31/2018

## 2018-05-31 NOTE — Telephone Encounter (Signed)
Left message requesting call back to discuss ultrasound results.

## 2018-05-31 NOTE — Patient Instructions (Signed)
Please give note to be out of work for the next week.  Follow up with orthopedics Get ultrasound/stat today Follow up with OB/Gyn

## 2018-06-01 NOTE — Telephone Encounter (Signed)
It was in regards to billing and front staff help her with her questions.

## 2018-06-01 NOTE — Telephone Encounter (Signed)
Please call and see what she needs. Shevawn Langenberg H. Lukis Bunt, MD  

## 2018-06-01 NOTE — Telephone Encounter (Signed)
Spoke with patient and advised of ultrasound results with verbal understanding. Patient would like for you to call her back.

## 2019-01-21 ENCOUNTER — Ambulatory Visit (INDEPENDENT_AMBULATORY_CARE_PROVIDER_SITE_OTHER): Payer: Medicaid Other | Admitting: Otolaryngology

## 2019-01-21 DIAGNOSIS — H66011 Acute suppurative otitis media with spontaneous rupture of ear drum, right ear: Secondary | ICD-10-CM

## 2019-05-02 ENCOUNTER — Other Ambulatory Visit: Payer: Self-pay

## 2019-05-02 ENCOUNTER — Ambulatory Visit (INDEPENDENT_AMBULATORY_CARE_PROVIDER_SITE_OTHER): Payer: Self-pay

## 2019-05-02 ENCOUNTER — Ambulatory Visit
Admission: EM | Admit: 2019-05-02 | Discharge: 2019-05-02 | Disposition: A | Discharge status: Home / Self Care | Payer: Medicaid Other | Attending: Emergency Medicine | Admitting: Emergency Medicine

## 2019-05-02 DIAGNOSIS — S93602A Unspecified sprain of left foot, initial encounter: Secondary | ICD-10-CM

## 2019-05-02 DIAGNOSIS — M25572 Pain in left ankle and joints of left foot: Secondary | ICD-10-CM

## 2019-05-02 DIAGNOSIS — S99922A Unspecified injury of left foot, initial encounter: Secondary | ICD-10-CM

## 2019-05-02 DIAGNOSIS — M79672 Pain in left foot: Secondary | ICD-10-CM

## 2019-05-02 NOTE — ED Notes (Signed)
Pt decline post op shoe

## 2019-05-02 NOTE — ED Provider Notes (Signed)
[facility]   [id number] 05/02/19 Arrival Time: 1037  CC: Left foot and ankle injury  SUBJECTIVE: History from: patient. Jasmine Maynard is a 28 y.o. female hx significant for ADHD, asthma, DDD, depression, heartburn, herpes, allergic rhinitis, and vertigo, complains of left foot injury x 1 day.  Began after she twisted her left foot and ankle when she stepped into a hole while running.  Localizes the pain to the top and bottom of midfoot.  Describes the pain as constant 3/10.  Has tried OTC antiinflammatory with minimal relief.  Symptoms are made worse with weight-bearing.  Denies similar symptoms in the past.  Complains of associated swelling, but now improved.  Denies fever, chills, erythema, ecchymosis, effusion, weakness, numbness and tingling.  ROS: As per HPI.  Past Medical History:  Diagnosis Date  . ADHD (attention deficit hyperactivity disorder)   . Asthma   . DDD (degenerative disc disease)    in back  . Depression    fine problem  . Heartburn   . Herpes   . Perennial allergic rhinitis   . Pregnant 01/21/2015  . Pregnant 01/26/2016  . Vertigo    Past Surgical History:  Procedure Laterality Date  . CHOLECYSTECTOMY  02/13/2012   Procedure: LAPAROSCOPIC CHOLECYSTECTOMY;  Surgeon: Fabio BeringBrent C Ziegler, MD;  Location: [facility];  Service: General;  Laterality: N/A;  . INTRAUTERINE DEVICE INSERTION  2011  . MYRINGOTOMY  2011   right   Allergies  Allergen Reactions  . Amoxicillin     Reaction: INEFFECTIVE Has patient had a PCN reaction causing immediate rash, facial/tongue/throat swelling, SOB or lightheadedness with hypotension: No Has patient had a PCN reaction causing severe rash involving mucus membranes or skin necrosis: No Has patient had a PCN reaction that required hospitalization No Has patient had a PCN reaction occurring within the last 10 years: No If all of the above answers are "NO", then may proceed with Cephalosporin use.   . Avelox [Moxifloxacin Hcl In  Nacl] Diarrhea and Nausea And Vomiting  . Cefdinir     ineffective  . Doxycycline Nausea And Vomiting  . Latex Rash   No current facility-administered medications on file prior to encounter.    Current Outpatient Medications on File Prior to Encounter  Medication Sig Dispense Refill  . naproxen (NAPROSYN) 500 MG tablet Take 1 tablet (500 mg total) by mouth 2 (two) times daily with a meal. 30 tablet 0  . [DISCONTINUED] dicyclomine (BENTYL) 20 MG tablet Take 1 tablet (20 mg total) by mouth every 6 (six) hours as needed (abdominal cramping). 15 tablet 0   Social History   Socioeconomic History  . Marital status: Married    Spouse name: Not on file  . Number of children: Not on file  . Years of education: Not on file  . Highest education level: Not on file  Occupational History  . Not on file  Social Needs  . Financial resource strain: Not hard at all  . Food insecurity    Worry: Never true    Inability: Never true  . Transportation needs    Medical: No    Non-medical: No  Tobacco Use  . Smoking status: Never Smoker  . Smokeless tobacco: Never Used  Substance and Sexual Activity  . Alcohol use: No  . Drug use: No  . Sexual activity: Yes    Partners: Male    Birth control/protection: None    Comment: husband has had vasectomy  Lifestyle  . Physical activity  Days per week: 7 days    Minutes per session: 60 min  . Stress: Rather much  Relationships  . Social Herbalist on phone: Never    Gets together: Once a week    Attends religious service: More than 4 times per year    Active member of club or organization: No    Attends meetings of clubs or organizations: Never    Relationship status: Married  . Intimate partner violence    Fear of current or ex partner: No    Emotionally abused: No    Physically abused: No    Forced sexual activity: No  Other Topics Concern  . Not on file  Social History Narrative  . Not on file   Family History  Problem  Relation Age of Onset  . Diabetes Paternal Grandfather   . Heart attack Paternal Grandfather   . Other Paternal Grandfather        aneursym  . Diabetes Paternal Grandmother   . Cancer Paternal Grandmother        breast,lung  . Other Maternal Grandmother        heart issues; has stent; has a eating disorder  . COPD Father   . Other Father        heart issues  . COPD Mother   . Mental illness Sister   . Autism Other     OBJECTIVE:  Vitals:   05/02/19 1048  BP: 132/90  Pulse: 78  Resp: 19  Temp: 98 F (36.7 C)  SpO2: 100%    General appearance: ALERT; in no acute distress.  Head: NCAT Lungs: Normal respiratory effort CV: Dorsalis pedis pulse 2+. Cap refill < 2 seconds Musculoskeletal: Left ankle foot Inspection: Skin warm, dry, clear and intact without obvious erythema, effusion, or ecchymosis.  Palpation: TTP over 3rd-4th proximal MTs and cuneiforms ROM: FROM active and passive Strength: 5/5 dorsiflexion, 5/5 plantar flexion Skin: warm and dry Neurologic: Mild antalgic gait; Sensation intact about the lower extremities Psychological: alert and cooperative; normal mood and affect  DIAGNOSTIC STUDIES:  Dg Foot Complete Left  Result Date: 05/02/2019 CLINICAL DATA:  [place] in hole.  Twisted left foot.  Left foot pain. EXAM: LEFT FOOT - COMPLETE 3+ VIEW COMPARISON:  None. FINDINGS: There is no evidence of fracture or dislocation. There is no evidence of arthropathy or other focal bone abnormality. Soft tissues are unremarkable. IMPRESSION: Negative left foot radiographs. Electronically Signed   By: San Morelle M.D.   On: 05/02/2019 11:37     ASSESSMENT & PLAN:  1. Injury of left foot, initial encounter   2. Sprain of left foot, initial encounter    Declines post-op shoe Will use crutches at home to limit weigh-bearing activities; instructed patient to progress activities as tolerated  X-rays did not show fracture or dislocation Continue conservative  management of rest, ice, and elevation Take ibuprofen as needed for pain relief (may cause abdominal discomfort, ulcers, and GI bleeds avoid taking with other NSAIDs) Follow up with orthopedist as needed for further evaluation and mangement Return or go to the ER if you have any new or worsening symptoms (fever, chills, chest pain, abdominal pain, changes in bowel or bladder habits, pain radiating into lower legs, changes in skin, symptoms that do not improve with medication, etc...)   Reviewed expectations re: course of current medical issues. Questions answered. Outlined signs and symptoms indicating need for more acute intervention. Patient verbalized understanding. After Visit Summary given.  Rennis HardingWurst, Abhijay Morriss, PA-C 05/02/19 1158

## 2019-05-02 NOTE — Discharge Instructions (Signed)
X-rays did not show fracture or dislocation Continue conservative management of rest, ice, and elevation Take ibuprofen as needed for pain relief (may cause abdominal discomfort, ulcers, and GI bleeds avoid taking with other NSAIDs) Follow up with orthopedist as needed for further evaluation and mangement Return or go to the ER if you have any new or worsening symptoms (fever, chills, chest pain, abdominal pain, changes in bowel or bladder habits, pain radiating into lower legs, changes in skin, symptoms that do not improve with medication, etc...)

## 2019-05-02 NOTE — ED Triage Notes (Signed)
Pt presents with complaints of left ankle and foot pain after her foot fell into a small hole in the yard yesterday

## 2019-05-21 ENCOUNTER — Other Ambulatory Visit: Payer: Self-pay

## 2019-05-21 ENCOUNTER — Ambulatory Visit
Admission: EM | Admit: 2019-05-21 | Discharge: 2019-05-21 | Disposition: A | Discharge status: Home / Self Care | Payer: Medicaid Other | Attending: Emergency Medicine | Admitting: Emergency Medicine

## 2019-05-21 ENCOUNTER — Ambulatory Visit (INDEPENDENT_AMBULATORY_CARE_PROVIDER_SITE_OTHER): Payer: Self-pay

## 2019-05-21 DIAGNOSIS — S63282A Dislocation of proximal interphalangeal joint of right middle finger, initial encounter: Secondary | ICD-10-CM

## 2019-05-21 DIAGNOSIS — S62609A Fracture of unspecified phalanx of unspecified finger, initial encounter for closed fracture: Secondary | ICD-10-CM

## 2019-05-21 NOTE — Discharge Instructions (Signed)
X-ray showed volar plate avulsion fracture at the PIP joint of the long finger Splint and ace applied Continue conservative management of rest, ice, and elevation You may use OTC ibuprofen and/or tylenol as needed Follow up with orthopedist for further evaluation and management Return or go to the ER if you have any new or worsening symptoms (fever, chills, chest pain, skin changes, sensation changes, symptoms do not improve with medication, etc...)

## 2019-05-21 NOTE — ED Triage Notes (Signed)
Pt fell on right hand about an hour ago, swelling noted

## 2019-05-21 NOTE — ED Notes (Signed)
Pt has pain in right middle finger. Pain started while outside, she fell and caught herself, she jammed right 3rd digit into the ground.

## 2019-05-21 NOTE — ED Provider Notes (Signed)
[place]   [id number] 05/21/19 Arrival Time: 1700  [id number] PAIN  SUBJECTIVE: History from: patient. Jasmine Maynard is a 28 y.o. female complains of right middle finger pain and swelling x 2 hours.  Tripped in the backyard and jammed her finger on the ground.  Localizes the pain to the right middle finger.  Describes the pain as constant and 4/10.   Has NOT tried OTC medications.  Symptoms are made worse with ROM.  Denies similar symptoms in the past.  Complains of associated swelling.  Denies fever, chills, erythema, ecchymosis, effusion, weakness, numbness and tingling.  ROS: As per HPI.  Past Medical History:  Diagnosis Date  . ADHD (attention deficit hyperactivity disorder)   . Asthma   . DDD (degenerative disc disease)    in back  . Depression    fine problem  . Heartburn   . Herpes   . Perennial allergic rhinitis   . Pregnant 01/21/2015  . Pregnant 01/26/2016  . Vertigo    Past Surgical History:  Procedure Laterality Date  . CHOLECYSTECTOMY  02/13/2012   Procedure: LAPAROSCOPIC CHOLECYSTECTOMY;  Surgeon: Donato Heinz, MD;  Location: [facility];  Service: General;  Laterality: N/A;  . INTRAUTERINE DEVICE INSERTION  2011  . MYRINGOTOMY  2011   right   Allergies  Allergen Reactions  . Amoxicillin     Reaction: INEFFECTIVE Has patient had a PCN reaction causing immediate rash, facial/tongue/throat swelling, SOB or lightheadedness with hypotension: No Has patient had a PCN reaction causing severe rash involving mucus membranes or skin necrosis: No Has patient had a PCN reaction that required hospitalization No Has patient had a PCN reaction occurring within the last 10 years: No If all of the above answers are "NO", then may proceed with Cephalosporin use.   . Avelox [Moxifloxacin Hcl In Nacl] Diarrhea and Nausea And Vomiting  . Cefdinir     ineffective  . Doxycycline Nausea And Vomiting  . Latex Rash   No current facility-administered medications on file  prior to encounter.    Current Outpatient Medications on File Prior to Encounter  Medication Sig Dispense Refill  . [DISCONTINUED] dicyclomine (BENTYL) 20 MG tablet Take 1 tablet (20 mg total) by mouth every 6 (six) hours as needed (abdominal cramping). 15 tablet 0   Social History   Socioeconomic History  . Marital status: Married    Spouse name: Not on file  . Number of children: Not on file  . Years of education: Not on file  . Highest education level: Not on file  Occupational History  . Not on file  Social Needs  . Financial resource strain: Not hard at all  . Food insecurity    Worry: Never true    Inability: Never true  . Transportation needs    Medical: No    Non-medical: No  Tobacco Use  . Smoking status: Never Smoker  . Smokeless tobacco: Never Used  Substance and Sexual Activity  . Alcohol use: No  . Drug use: No  . Sexual activity: Yes    Partners: Male    Birth control/protection: None    Comment: husband has had vasectomy  Lifestyle  . Physical activity    Days per week: 7 days    Minutes per session: 60 min  . Stress: Rather much  Relationships  . Social Herbalist on phone: Never    Gets together: Once a week    Attends religious service: More than 4 times per  year    Active member of club or organization: No    Attends meetings of clubs or organizations: Never    Relationship status: Married  . Intimate partner violence    Fear of current or ex partner: No    Emotionally abused: No    Physically abused: No    Forced sexual activity: No  Other Topics Concern  . Not on file  Social History Narrative  . Not on file   Family History  Problem Relation Age of Onset  . Diabetes Paternal Grandfather   . Heart attack Paternal Grandfather   . Other Paternal Grandfather        aneursym  . Diabetes Paternal Grandmother   . Cancer Paternal Grandmother        breast,lung  . Other Maternal Grandmother        heart issues; has stent; has a  eating disorder  . COPD Father   . Other Father        heart issues  . COPD Mother   . Mental illness Sister   . Autism Other     OBJECTIVE:  Vitals:   05/21/19 1707  BP: 139/86  Pulse: 78  Resp: 16  Temp: 99.2 F (37.3 C)  TempSrc: Oral  SpO2: 98%    General appearance: ALERT; in no acute distress.  Head: NCAT Lungs: Normal respiratory effort CV: Radial pulses 2+. Cap refill < 2 seconds Musculoskeletal: RT hand Inspection:  Third digit with obvious swelling Palpation: TTP over third MCP joint, proximal phalanx, and PIP joint ROM: LROM about the third joint Strength: decreased grip strength Skin: warm and dry Neurologic: Ambulates without difficulty; Sensation intact about the upper extremities Psychological: alert and cooperative; normal mood and affect  DIAGNOSTIC STUDIES:  Dg Hand Complete Right  Result Date: 05/21/2019 CLINICAL DATA:  Larey SeatFell today and injured middle finger. EXAM: RIGHT HAND - COMPLETE 3+ VIEW COMPARISON:  None. FINDINGS: There is a volar plate avulsion fracture involving the middle phalanx at the PIP joint. The other bony structures are intact. IMPRESSION: Volar plate avulsion fracture at the PIP joint of the long finger. Electronically Signed   By: Rudie MeyerP.  Gallerani M.D.   On: 05/21/2019 17:41     ASSESSMENT & PLAN:  1. Closed fracture dislocation of proximal interphalangeal (PIP) joint of finger, initial encounter    X-ray showed volar plate avulsion fracture at the PIP joint of the long finger Splint and ace applied Continue conservative management of rest, ice, and elevation You may use OTC ibuprofen and/or tylenol as needed Follow up with orthopedist for further evaluation and management Return or go to the ER if you have any new or worsening symptoms (fever, chills, chest pain, skin changes, sensation changes, symptoms do not improve with medication, etc...)   Reviewed expectations re: course of current medical issues. Questions answered.  Outlined signs and symptoms indicating need for more acute intervention. Patient verbalized understanding. After Visit Summary given.    Rennis HardingWurst, Frances Ambrosino, PA-C 05/21/19 1754

## 2019-05-22 ENCOUNTER — Ambulatory Visit (INDEPENDENT_AMBULATORY_CARE_PROVIDER_SITE_OTHER): Payer: Self-pay | Admitting: Orthopaedic Surgery

## 2019-05-22 ENCOUNTER — Encounter: Payer: Self-pay | Admitting: Orthopaedic Surgery

## 2019-05-22 DIAGNOSIS — S62609A Fracture of unspecified phalanx of unspecified finger, initial encounter for closed fracture: Secondary | ICD-10-CM | POA: Insufficient documentation

## 2019-05-22 DIAGNOSIS — S62652A Nondisplaced fracture of medial phalanx of right middle finger, initial encounter for closed fracture: Secondary | ICD-10-CM

## 2019-05-22 NOTE — Progress Notes (Signed)
Office Visit Note   Patient: Jasmine Maynard           Date of Birth: December 22, 1990           MRN: [id number] Visit Date: 05/22/2019              Requested by: No referring provider defined for this encounter. PCP: Patient, No Pcp Per   Assessment & Plan: Visit Diagnoses:  1. Closed nondisplaced fracture of middle phalanx of right middle finger, initial encounter     Plan: Minimally displaced volar plate fracture base of the middle phalanx right long finger.  Logically intact.  No deformity.  Will apply dorsal splint and reevaluate in 2 weeks with new films.  Follow-Up Instructions: Return in about 2 weeks (around 06/05/2019).   Orders:  No orders of the defined types were placed in this encounter.  No orders of the defined types were placed in this encounter.     Procedures: No procedures performed   Clinical Data: No additional findings.   Subjective: Chief Complaint  Patient presents with  . Right Middle Finger - Fracture    Fall 05/21/2019  Patient presents with right middle finger fracture that she sustained after fall yesterday. She states that when she fell, her fingers went straight into the ground. She fell on grass. She was seen at [facility] in [place] and had x-rays. She was put in a splint and advised to take ibuprofen for pain. She continues to have swelling. The pain seems to be worse if she tries to move it, and is in the middle finger all of the way down past the knuckle. She has not had any relief from the ibuprofen. Films were reviewed demonstrating a minimally displaced volar plate fracture at the base of the middle phalanx right long finger.  HPI  Review of Systems   Objective: Vital Signs: BP (!) 133/55   Pulse 70   Ht 5\' 4"  (1.626 m)   Wt 210 lb (95.3 kg)   BMI 36.05 kg/m   Physical Exam  Ortho Exam right hand splint and dressing were removed.  There is some ecchymosis on the volar aspect of the PIP joint of the long finger with some  local tenderness.  Mild swelling of the digit.  Good capillary refill and normal sensation.  No instability with radial and ulnar stress.  Full extension and about 80 degrees of flexion the PIP joint.  Specialty Comments:  No specialty comments available.  Imaging: Dg Hand Complete Right  Result Date: 05/21/2019 CLINICAL DATA:  [place] today and injured middle finger. EXAM: RIGHT HAND - COMPLETE 3+ VIEW COMPARISON:  None. FINDINGS: There is a volar plate avulsion fracture involving the middle phalanx at the PIP joint. The other bony structures are intact. IMPRESSION: Volar plate avulsion fracture at the PIP joint of the long finger. Electronically Signed   By: Marijo Sanes M.D.   On: 05/21/2019 17:41     PMFS History: Patient Active Problem List   Diagnosis Date Noted  . Finger fracture, right 05/22/2019  . Postpartum care following vaginal delivery 09/03/2016  . Supervision of normal pregnancy 02/09/2016  . Short interval between pregnancies affecting pregnancy, antepartum 02/09/2016  . Rh negative state in antepartum period 02/09/2016  . H/O Postpartum hypertension 10/19/2015  . Rupture of membranes with clear amniotic fluid 09/24/2015  . Rh sensitization   . 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 GERD (gastroesophageal reflux disease) 02/09/2015  . Constipation 02/09/2015  . Heartburn 01/21/2015  .  Vertigo 01/21/2015  . 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  . Chronic low back pain 09/17/2013  . Depression with anxiety 02/11/2013  . ADHD (attention deficit hyperactivity disorder) 02/11/2013   Past Medical History:  Diagnosis Date  . ADHD (attention deficit hyperactivity disorder)   . Asthma   . DDD (degenerative disc disease)    in back  . Depression    fine problem  . Heartburn   . Herpes   . Perennial allergic rhinitis   . Pregnant 01/21/2015  . Pregnant 01/26/2016  . Vertigo     Family History  Problem Relation Age of Onset  . Diabetes Paternal Grandfather   .  Heart attack Paternal Grandfather   . Other Paternal Grandfather        aneursym  . Diabetes Paternal Grandmother   . Cancer Paternal Grandmother        breast,lung  . Other Maternal Grandmother        heart issues; has stent; has a eating disorder  . COPD Father   . Other Father        heart issues  . COPD Mother   . Mental illness Sister   . Autism Other     Past Surgical History:  Procedure Laterality Date  . CHOLECYSTECTOMY  02/13/2012   Procedure: LAPAROSCOPIC CHOLECYSTECTOMY;  Surgeon: Fabio BeringBrent C Ziegler, MD;  Location: [facility];  Service: General;  Laterality: N/A;  . INTRAUTERINE DEVICE INSERTION  2011  . MYRINGOTOMY  2011   right   Social History   Occupational History  . Not on file  Tobacco Use  . Smoking status: Never Smoker  . Smokeless tobacco: Never Used  Substance and Sexual Activity  . Alcohol use: No  . Drug use: No  . Sexual activity: Yes    Partners: Male    Birth control/protection: None    Comment: husband has had vasectomy

## 2019-06-05 ENCOUNTER — Ambulatory Visit (INDEPENDENT_AMBULATORY_CARE_PROVIDER_SITE_OTHER): Payer: Self-pay

## 2019-06-05 ENCOUNTER — Ambulatory Visit (INDEPENDENT_AMBULATORY_CARE_PROVIDER_SITE_OTHER): Payer: Self-pay | Admitting: Orthopaedic Surgery

## 2019-06-05 ENCOUNTER — Encounter: Payer: Self-pay | Admitting: Orthopaedic Surgery

## 2019-06-05 ENCOUNTER — Other Ambulatory Visit: Payer: Self-pay

## 2019-06-05 VITALS — BP 126/73 | HR 78 | Ht 64.0 in | Wt 215.0 lb

## 2019-06-05 DIAGNOSIS — S62652A Nondisplaced fracture of medial phalanx of right middle finger, initial encounter for closed fracture: Secondary | ICD-10-CM

## 2019-06-05 NOTE — Progress Notes (Addendum)
Office Visit Note   Patient: Jasmine Maynard           Date of Birth: 1991-08-08           MRN: [id number] Visit Date: 06/05/2019              Requested by: No referring provider defined for this encounter. PCP: Patient, No Pcp Per   Assessment & Plan: Visit Diagnoses:  1. Closed nondisplaced fracture of middle phalanx of right middle finger, initial encounter     Plan: I would like Dr. Lorin Mercy to look at her films when he is in the office tomorrow to see if she would be a candidate for surgical repair.  She will continue with gentle range of motion and splinting in the meantime.  There might be very slight displacement from 2 weeks ago  Follow-Up Instructions: Return in about 2 weeks (around 06/19/2019).   Orders:  Orders Placed This Encounter  Procedures  . XR Finger Middle Right   No orders of the defined types were placed in this encounter.     Procedures: No procedures performed   Clinical Data: No additional findings.   Subjective: Chief Complaint  Patient presents with  . Right Hand - Follow-up    Right middle finger fracture DOI 05/21/2019  Patient presents today for a two week follow up on her right middle finger fracture. She is in a finger splint and still has complaints of pain. She does not take anything for pain. She states that she has difficulty extending her finger.   HPI  Review of Systems   Objective: Vital Signs: BP 126/73   Pulse 78   Ht 5\' 4"  (1.626 m)   Wt 215 lb (97.5 kg)   Breastfeeding No   BMI 36.90 kg/m   Physical Exam  Ortho Exam right hand with some tenderness on the volar aspect of the PIP joint long finger where she had the prior fracture.  Minimal swelling.  Normal sensation and capillary refill.  Lacked a few degrees to full extension and was able to flex may be 30 to 35 degrees actively.  Passively I could fully extend the finger and flex about 45 to 50 degrees. no evidence of instability with an ulnar or radial deviation   Specialty Comments:  No specialty comments available.  Imaging: No results found.   PMFS History: Patient Active Problem List   Diagnosis Date Noted  . Finger fracture, right 05/22/2019  . Postpartum care following vaginal delivery 09/03/2016  . Supervision of normal pregnancy 02/09/2016  . Short interval between pregnancies affecting pregnancy, antepartum 02/09/2016  . Rh negative state in antepartum period 02/09/2016  . H/O Postpartum hypertension 10/19/2015  . Rupture of membranes with clear amniotic fluid 09/24/2015  . Rh sensitization   . 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 GERD (gastroesophageal reflux disease) 02/09/2015  . Constipation 02/09/2015  . Heartburn 01/21/2015  . Vertigo 01/21/2015  . 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  . Chronic low back pain 09/17/2013  . Depression with anxiety 02/11/2013  . ADHD (attention deficit hyperactivity disorder) 02/11/2013   Past Medical History:  Diagnosis Date  . ADHD (attention deficit hyperactivity disorder)   . Asthma   . DDD (degenerative disc disease)    in back  . Depression    fine problem  . Heartburn   . Herpes   . Perennial allergic rhinitis   . Pregnant 01/21/2015  . Pregnant 01/26/2016  . Vertigo     Family History  Problem Relation Age of Onset  . Diabetes Paternal Grandfather   . Heart attack Paternal Grandfather   . Other Paternal Grandfather        aneursym  . Diabetes Paternal Grandmother   . Cancer Paternal Grandmother        breast,lung  . Other Maternal Grandmother        heart issues; has stent; has a eating disorder  . COPD Father   . Other Father        heart issues  . COPD Mother   . Mental illness Sister   . Autism Other     Past Surgical History:  Procedure Laterality Date  . CHOLECYSTECTOMY  02/13/2012   Procedure: LAPAROSCOPIC CHOLECYSTECTOMY;  Surgeon: Fabio BeringBrent C Ziegler, MD;  Location: [facility];  Service: General;  Laterality: N/A;  . INTRAUTERINE DEVICE INSERTION  2011  .  MYRINGOTOMY  2011   right   Social History   Occupational History  . Not on file  Tobacco Use  . Smoking status: Never Smoker  . Smokeless tobacco: Never Used  Substance and Sexual Activity  . Alcohol use: No  . Drug use: No  . Sexual activity: Yes    Partners: Male    Birth control/protection: None    Comment: husband has had vasectomy

## 2019-06-19 ENCOUNTER — Encounter: Payer: Self-pay | Admitting: Orthopaedic Surgery

## 2019-06-19 ENCOUNTER — Ambulatory Visit (INDEPENDENT_AMBULATORY_CARE_PROVIDER_SITE_OTHER): Payer: Self-pay | Admitting: Orthopaedic Surgery

## 2019-06-19 ENCOUNTER — Ambulatory Visit (INDEPENDENT_AMBULATORY_CARE_PROVIDER_SITE_OTHER): Payer: Self-pay

## 2019-06-19 ENCOUNTER — Other Ambulatory Visit: Payer: Self-pay

## 2019-06-19 VITALS — BP 126/61 | HR 90 | Ht 64.0 in | Wt 215.0 lb

## 2019-06-19 DIAGNOSIS — M79644 Pain in right finger(s): Secondary | ICD-10-CM

## 2019-06-19 DIAGNOSIS — S62652D Nondisplaced fracture of medial phalanx of right middle finger, subsequent encounter for fracture with routine healing: Secondary | ICD-10-CM

## 2019-06-19 NOTE — Progress Notes (Signed)
Office Visit Note   Patient: Jasmine Maynard           Date of Birth: [birth year]           MRN: [id number] Visit Date: 06/19/2019              Requested by: No referring provider defined for this encounter. PCP: Patient, No Pcp Per   Assessment & Plan: Visit Diagnoses:  1. Pain of right middle finger   2. Closed nondisplaced fracture of middle phalanx of right middle finger with routine healing, subsequent encounter     Plan: Resting nicely with the avulsion fracture and capsule injury to the PIP joint right long finger.  Now has full flexion of the finger I can passively extend the finger fully but she has some difficulty actively.  Waynette Buttery does not have any insurance and wishes to avoid physical therapy but we have demonstrated some exercises.  No longer wearing the splint.  Office 3 weeks.  Hopefully should be fine by that point  Follow-Up Instructions: Return in about 3 weeks (around 07/10/2019).   Orders:  Orders Placed This Encounter  Procedures  . XR Finger Middle Right   No orders of the defined types were placed in this encounter.     Procedures: No procedures performed   Clinical Data: No additional findings.   Subjective: Chief Complaint  Patient presents with  . Right Hand - Follow-up  DOI 05/21/2019  Patient presents today for a two week follow up on her right middle finger fracture. She injured her finger one month ago. She was placed into a splint but is not wearing it today. She said that she cannot fully extend her finger.  HPI  Review of Systems   Objective: Vital Signs: BP 126/61   Pulse 90   Ht 5\' 4"  (1.626 m)   Wt 215 lb (97.5 kg)   BMI 36.90 kg/m   Physical Exam  Ortho Exam right long finger is not swollen.  She could easily make a full fist and touch the tip of her finger to the palm of her hand.  I could slowly and passively fully extend the IP joint.  She could not fully extend the the PIP joint actively.  No deformity.  Mild tenderness  over the volar plate.  No swelling distally.  Neurovascular exam intact  Specialty Comments:  No specialty comments available.  Imaging: Xr Finger Middle Right  Result Date: 06/19/2019 Films of the right hand were obtained specifically of the long finger.  There is no change in the position of the avulsion fracture at the base of the middle phalanx    PMFS History: Patient Active Problem List   Diagnosis Date Noted  . Finger fracture, right 05/22/2019  . Postpartum care following vaginal delivery 09/03/2016  . Supervision of normal pregnancy 02/09/2016  . Short interval between pregnancies affecting pregnancy, antepartum 02/09/2016  . Rh negative state in antepartum period 02/09/2016  . H/O Postpartum hypertension 10/19/2015  . Rupture of membranes with clear amniotic fluid 09/24/2015  . Rh sensitization   . 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 GERD (gastroesophageal reflux disease) 02/09/2015  . Constipation 02/09/2015  . Heartburn 01/21/2015  . Vertigo 01/21/2015  . 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  . Chronic low back pain 09/17/2013  . Depression with anxiety 02/11/2013  . ADHD (attention deficit hyperactivity disorder) 02/11/2013   Past Medical History:  Diagnosis Date  . ADHD (attention deficit hyperactivity disorder)   . Asthma   .  DDD (degenerative disc disease)    in back  . Depression    fine problem  . Heartburn   . Herpes   . Perennial allergic rhinitis   . Pregnant 01/21/2015  . Pregnant 01/26/2016  . Vertigo     Family History  Problem Relation Age of Onset  . Diabetes Paternal Grandfather   . Heart attack Paternal Grandfather   . Other Paternal Grandfather        aneursym  . Diabetes Paternal Grandmother   . Cancer Paternal Grandmother        breast,lung  . Other Maternal Grandmother        heart issues; has stent; has a eating disorder  . COPD Father   . Other Father        heart issues  . COPD Mother   . Mental illness Sister   .  Autism Other     Past Surgical History:  Procedure Laterality Date  . CHOLECYSTECTOMY  02/13/2012   Procedure: LAPAROSCOPIC CHOLECYSTECTOMY;  Surgeon: Fabio BeringBrent C Ziegler, MD;  Location: [facility];  Service: General;  Laterality: N/A;  . INTRAUTERINE DEVICE INSERTION  2011  . MYRINGOTOMY  2011   right   Social History   Occupational History  . Not on file  Tobacco Use  . Smoking status: Never Smoker  . Smokeless tobacco: Never Used  Substance and Sexual Activity  . Alcohol use: No  . Drug use: No  . Sexual activity: Yes    Partners: Male    Birth control/protection: None    Comment: husband has had vasectomy

## 2019-07-17 ENCOUNTER — Ambulatory Visit: Payer: Self-pay | Admitting: Orthopaedic Surgery

## 2019-07-17 ENCOUNTER — Other Ambulatory Visit: Payer: Self-pay

## 2019-12-07 ENCOUNTER — Other Ambulatory Visit: Payer: Self-pay

## 2019-12-07 ENCOUNTER — Ambulatory Visit
Admission: EM | Admit: 2019-12-07 | Discharge: 2019-12-07 | Disposition: A | Discharge status: Home / Self Care | Payer: Medicaid Other

## 2019-12-07 DIAGNOSIS — T148XXA Other injury of unspecified body region, initial encounter: Secondary | ICD-10-CM | POA: Diagnosis not present

## 2019-12-07 NOTE — Discharge Instructions (Addendum)
I&D not required Advised patient to take ibuprofen or Tylenol as needed for pain May use cold or warm compress To return for worsening of symptoms

## 2019-12-07 NOTE — ED Provider Notes (Signed)
[facility]    CSN: [id number] Arrival date & time: 12/07/19  1026      History   Chief Complaint Chief Complaint  Patient presents with  . Fall    HPI Odie Buckbee is a 29 y.o. female.   Lavaya Tuch 29 years old female presented to the urgent care with a complaint of fall 1 hour ago and landing on her left arm.  Reports small hematoma on forearm.  Patient reports she has not tried any medication.  Report pain at 3 on a scale of 1-10.  Denies headache, loss of consciousness, fever, chills, nausea, vomiting, diarrhea, chest pain chest tightness     Past Medical History:  Diagnosis Date  . ADHD (attention deficit hyperactivity disorder)   . Asthma   . DDD (degenerative disc disease)    in back  . Depression    fine problem  . Heartburn   . Herpes   . Perennial allergic rhinitis   . Pregnant 01/21/2015  . Pregnant 01/26/2016  . Vertigo     Patient Active Problem List   Diagnosis Date Noted  . Finger fracture, right 05/22/2019  . Postpartum care following vaginal delivery 09/03/2016  . Supervision of normal pregnancy 02/09/2016  . Short interval between pregnancies affecting pregnancy, antepartum 02/09/2016  . Rh negative state in antepartum period 02/09/2016  . H/O Postpartum hypertension 10/19/2015  . Rupture of membranes with clear amniotic fluid 09/24/2015  . Rh sensitization   . 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 GERD (gastroesophageal reflux disease) 02/09/2015  . Constipation 02/09/2015  . Heartburn 01/21/2015  . Vertigo 01/21/2015  . 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  . Chronic low back pain 09/17/2013  . Depression with anxiety 02/11/2013  . ADHD (attention deficit hyperactivity disorder) 02/11/2013    Past Surgical History:  Procedure Laterality Date  . CHOLECYSTECTOMY  02/13/2012   Procedure: LAPAROSCOPIC CHOLECYSTECTOMY;  Surgeon: Fabio Bering, MD;  Location: [facility];  Service: General;  Laterality: N/A;  . INTRAUTERINE  DEVICE INSERTION  2011  . MYRINGOTOMY  2011   right    OB History    Gravida  3   Para  3   Term  3   Preterm      AB      Living  3     SAB      TAB      Ectopic      Multiple  0   Live Births  3            Home Medications    Prior to Admission medications   Medication Sig Start Date End Date Taking? Authorizing Provider  dicyclomine (BENTYL) 20 MG tablet Take 1 tablet (20 mg total) by mouth every 6 (six) hours as needed (abdominal cramping). 01/23/12 02/09/12  Samuel Jester, DO    Family History Family History  Problem Relation Age of Onset  . Diabetes Paternal Grandfather   . Heart attack Paternal Grandfather   . Other Paternal Grandfather        aneursym  . Diabetes Paternal Grandmother   . Cancer Paternal Grandmother        breast,lung  . Other Maternal Grandmother        heart issues; has stent; has a eating disorder  . COPD Father   . Other Father        heart issues  . COPD Mother   . Mental illness Sister   . Autism Other  Social History Social History   Tobacco Use  . Smoking status: Never Smoker  . Smokeless tobacco: Never Used  Substance Use Topics  . Alcohol use: No  . Drug use: No     Allergies   Amoxicillin, Avelox [moxifloxacin hcl in nacl], Cefdinir, Doxycycline, and Latex   Review of Systems Review of Systems  Constitutional: Negative.   Respiratory: Negative.   Cardiovascular: Negative.   Musculoskeletal: Negative.   Skin: Positive for color change.  All other systems reviewed and are negative.    Physical Exam Triage Vital Signs ED Triage Vitals  Enc Vitals Group     BP 12/07/19 1046 115/75     Pulse Rate 12/07/19 1046 78     Resp 12/07/19 1046 16     Temp 12/07/19 1046 98.9 F (37.2 C)     Temp Source 12/07/19 1046 Oral     SpO2 12/07/19 1046 98 %     Weight --      Height --      Head Circumference --      Peak Flow --      Pain Score 12/07/19 1053 3     Pain Loc --      Pain Edu? --        Excl. in [place]? --    No data found.  Updated Vital Signs BP 115/75 (BP Location: Right Arm)   Pulse 78   Temp 98.9 F (37.2 C) (Oral)   Resp 16   SpO2 98%   Visual Acuity Right Eye Distance:   Left Eye Distance:   Bilateral Distance:    Right Eye Near:   Left Eye Near:    Bilateral Near:     Physical Exam Vitals and nursing note reviewed.  Constitutional:      General: She is not in acute distress.    Appearance: Normal appearance. She is normal weight. She is not ill-appearing or toxic-appearing.  Cardiovascular:     Rate and Rhythm: Normal rate and regular rhythm.     Pulses: Normal pulses.     Heart sounds: Normal heart sounds.  Pulmonary:     Effort: Pulmonary effort is normal.     Breath sounds: Normal breath sounds.  Musculoskeletal:        General: No swelling, tenderness, deformity or signs of injury. Normal range of motion.       Arms:     Right lower leg: No edema.     Left lower leg: No edema.     Comments: hematoma  Skin:    General: Skin is warm.     Coloration: Skin is not pale.     Findings: Erythema present. No bruising.  Neurological:     Mental Status: She is alert.      UC Treatments / Results  Labs (all labs ordered are listed, but only abnormal results are displayed) Labs Reviewed - No data to display  EKG   Radiology No results found.  Procedures Procedures (including critical care time)  Medications Ordered in UC Medications - No data to display  Initial Impression / Assessment and Plan / UC Course  I have reviewed the triage vital signs and the nursing notes.  Pertinent labs & imaging results that were available during my care of the patient were reviewed by me and considered in my medical decision making (see chart for details).   Patient is stable for discharge No I&D or antibiotic treatment require Advised patient to take ibuprofen or Tylenol  for pain  use cold or warm compress To return for worsening of  symptoms Patient verbalized understanding of the plan of care  Final Clinical Impressions(s) / UC Diagnoses   Final diagnoses:  Hematoma of skin     Discharge Instructions     I&D not required Advised patient to take ibuprofen or Tylenol as needed for pain May use cold or warm compress To return for worsening of symptoms    ED Prescriptions    None     PDMP not reviewed this encounter.   Durward Parcel, FNP 12/07/19 1130

## 2019-12-07 NOTE — ED Triage Notes (Signed)
Pt had trip and fall earlier and landed on left arm, no deformity noted, small hematoma on forearm

## 2020-04-20 ENCOUNTER — Other Ambulatory Visit: Payer: Self-pay

## 2020-04-20 ENCOUNTER — Ambulatory Visit
Admission: EM | Admit: 2020-04-20 | Discharge: 2020-04-20 | Disposition: A | Discharge status: Home / Self Care | Payer: Medicaid Other | Attending: Emergency Medicine | Admitting: Emergency Medicine

## 2020-04-20 DIAGNOSIS — J0101 Acute recurrent maxillary sinusitis: Secondary | ICD-10-CM

## 2020-04-20 MED ORDER — PREDNISONE 10 MG PO TABS: 20.0000 mg | ORAL_TABLET | Freq: Every day | ORAL | 0 refills | Status: DC

## 2020-04-20 MED ORDER — AZITHROMYCIN 250 MG PO TABS: 250.0000 mg | ORAL_TABLET | Freq: Every day | ORAL | 0 refills | Status: DC

## 2020-04-20 NOTE — Discharge Instructions (Addendum)
Rest and push fluids Azithromycin prescribed.  Take as directed and to completion Prednisone as prescribed Continue to take your allergy medication as prescribed Continue with OTC ibuprofen/tylenol as needed for pain Follow up with PCP or [facility] if symptoms persists Return or go to the ED if you have any new or worsening symptoms such as fever, chills, worsening sinus pain/pressure, cough, sore throat, chest pain, shortness of breath, abdominal pain, changes in bowel or bladder habits, etc..Marland Kitchen

## 2020-04-20 NOTE — ED Provider Notes (Signed)
[facility]    CSN: [id number] Arrival date & time: 04/20/20  1937      History   Chief Complaint Chief Complaint  Patient presents with  . Nasal Congestion    HPI Jasmine Maynard is a 29 y.o. female.   Who presented to the urgent care with a complaint of sinus pressure, sinus pain and earache for the past 3 weeks.  Reported yellowish-greenish nasal discharge.  Denies sick exposure to COVID, flu or strep.  Denies recent travel.  Denies aggravating or alleviating symptoms.  Denies previous COVID infection.   Denies fever, chills, fatigue,  rhinorrhea, sore throat, SOB, wheezing, chest pain, nausea, vomiting, changes in bowel or bladder habits.    The history is provided by the patient. No language interpreter was used.    Past Medical History:  Diagnosis Date  . ADHD (attention deficit hyperactivity disorder)   . Asthma   . DDD (degenerative disc disease)    in back  . Depression    fine problem  . Heartburn   . Herpes   . Perennial allergic rhinitis   . Pregnant 01/21/2015  . Pregnant 01/26/2016  . Vertigo     Patient Active Problem List   Diagnosis Date Noted  . Finger fracture, right 05/22/2019  . Postpartum care following vaginal delivery 09/03/2016  . Supervision of normal pregnancy 02/09/2016  . Short interval between pregnancies affecting pregnancy, antepartum 02/09/2016  . Rh negative state in antepartum period 02/09/2016  . H/O Postpartum hypertension 10/19/2015  . Rupture of membranes with clear amniotic fluid 09/24/2015  . Rh sensitization   . 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 GERD (gastroesophageal reflux disease) 02/09/2015  . Constipation 02/09/2015  . Heartburn 01/21/2015  . Vertigo 01/21/2015  . 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  . Chronic low back pain 09/17/2013  . Depression with anxiety 02/11/2013  . ADHD (attention deficit hyperactivity disorder) 02/11/2013    Past Surgical History:  Procedure Laterality Date  .  CHOLECYSTECTOMY  02/13/2012   Procedure: LAPAROSCOPIC CHOLECYSTECTOMY;  Surgeon: Fabio Bering, MD;  Location: [facility];  Service: General;  Laterality: N/A;  . INTRAUTERINE DEVICE INSERTION  2011  . MYRINGOTOMY  2011   right    OB History    Gravida  3   Para  3   Term  3   Preterm      AB      Living  3     SAB      TAB      Ectopic      Multiple  0   Live Births  3            Home Medications    Prior to Admission medications   Medication Sig Start Date End Date Taking? Authorizing Provider  azithromycin (ZITHROMAX) 250 MG tablet Take 1 tablet (250 mg total) by mouth daily. Take first 2 tablets together, then 1 every day until finished. 04/20/20   Avegno, Zachery Dakins, FNP  predniSONE (DELTASONE) 10 MG tablet Take 2 tablets (20 mg total) by mouth daily. 04/20/20   Avegno, Zachery Dakins, FNP  dicyclomine (BENTYL) 20 MG tablet Take 1 tablet (20 mg total) by mouth every 6 (six) hours as needed (abdominal cramping). 01/23/12 02/09/12  Samuel Jester, DO    Family History Family History  Problem Relation Age of Onset  . Diabetes Paternal Grandfather   . Heart attack Paternal Grandfather   . Other Paternal Grandfather  aneursym  . Diabetes Paternal Grandmother   . Cancer Paternal Grandmother        breast,lung  . Other Maternal Grandmother        heart issues; has stent; has a eating disorder  . COPD Father   . Other Father        heart issues  . COPD Mother   . Mental illness Sister   . Autism Other     Social History Social History   Tobacco Use  . Smoking status: Never Smoker  . Smokeless tobacco: Never Used  Substance Use Topics  . Alcohol use: No  . Drug use: No     Allergies   Amoxicillin, Avelox [moxifloxacin hcl in nacl], Cefdinir, Doxycycline, and Latex   Review of Systems Review of Systems  Constitutional: Negative.   HENT: Positive for ear pain, sinus pressure and sinus pain.   Respiratory: Negative.   Cardiovascular:  Negative.   Gastrointestinal: Negative.   Neurological: Negative.      Physical Exam Triage Vital Signs ED Triage Vitals  Enc Vitals Group     BP 04/20/20 1037 (!) 154/84     Pulse Rate 04/20/20 1037 83     Resp 04/20/20 1037 20     Temp 04/20/20 1037 98.8 F (37.1 C)     Temp src --      SpO2 04/20/20 1037 98 %     Weight --      Height --      Head Circumference --      Peak Flow --      Pain Score 04/20/20 1035 6     Pain Loc --      Pain Edu? --      Excl. in [place]? --    No data found.  Updated Vital Signs BP (!) 154/84   Pulse 83   Temp 98.8 F (37.1 C)   Resp 20   LMP 03/23/2020 (Approximate)   SpO2 98%   Visual Acuity Right Eye Distance:   Left Eye Distance:   Bilateral Distance:    Right Eye Near:   Left Eye Near:    Bilateral Near:     Physical Exam Vitals and nursing note reviewed.  Constitutional:      General: She is not in acute distress.    Appearance: Normal appearance. She is normal weight. She is not ill-appearing or toxic-appearing.  HENT:     Head: Normocephalic.     Right Ear: Tympanic membrane, ear canal and external ear normal. There is no impacted cerumen. A PE tube is present.     Left Ear: Tympanic membrane, ear canal and external ear normal. There is no impacted cerumen.     Nose: No congestion.     Right Sinus: Maxillary sinus tenderness present.     Left Sinus: Maxillary sinus tenderness present.     Mouth/Throat:     Mouth: Mucous membranes are moist.     Pharynx: Oropharynx is clear. No oropharyngeal exudate or posterior oropharyngeal erythema.  Cardiovascular:     Rate and Rhythm: Normal rate and regular rhythm.     Pulses: Normal pulses.     Heart sounds: Normal heart sounds. No murmur.  Pulmonary:     Effort: Pulmonary effort is normal. No respiratory distress.     Breath sounds: Normal breath sounds. No wheezing or rhonchi.  Chest:     Chest wall: No tenderness.  Neurological:     Mental Status: She is alert and  oriented to person, place, and time.      UC Treatments / Results  Labs (all labs ordered are listed, but only abnormal results are displayed) Labs Reviewed - No data to display  EKG   Radiology No results found.  Procedures Procedures (including critical care time)  Medications Ordered in UC Medications - No data to display  Initial Impression / Assessment and Plan / UC Course  I have reviewed the triage vital signs and the nursing notes.  Pertinent labs & imaging results that were available during my care of the patient were reviewed by me and considered in my medical decision making (see chart for details).    Patient stable at discharge.  Azithromycin was prescribed for sinusitis as patient is allergic to penicillin.  Short-term prednisone was also prescribed.  Was advised to follow-up with PCP and ENT. Final Clinical Impressions(s) / UC Diagnoses   Final diagnoses:  Acute recurrent maxillary sinusitis     Discharge Instructions     Rest and push fluids Azithromycin prescribed.  Take as directed and to completion Prednisone as prescribed Continue to take your allergy medication as prescribed Continue with OTC ibuprofen/tylenol as needed for pain Follow up with PCP or [facility] if symptoms persists Return or go to the ED if you have any new or worsening symptoms such as fever, chills, worsening sinus pain/pressure, cough, sore throat, chest pain, shortness of breath, abdominal pain, changes in bowel or bladder habits, etc...     ED Prescriptions    Medication Sig Dispense Auth. Provider   azithromycin (ZITHROMAX) 250 MG tablet Take 1 tablet (250 mg total) by mouth daily. Take first 2 tablets together, then 1 every day until finished. 6 tablet Avegno, Komlanvi S, FNP   predniSONE (DELTASONE) 10 MG tablet Take 2 tablets (20 mg total) by mouth daily. 15 tablet Avegno, Darrelyn Hillock, FNP     PDMP not reviewed this encounter.   Emerson Monte, FNP  04/20/20 [phone]

## 2020-04-20 NOTE — ED Triage Notes (Signed)
Pt presents with sinus congestion and pain for past 3 weeks. Unable to see ENT until June. Pt also has earache being treated with drops

## 2020-05-18 ENCOUNTER — Ambulatory Visit: Payer: Medicaid Other | Admitting: Advanced Practice Midwife

## 2020-05-18 ENCOUNTER — Other Ambulatory Visit (HOSPITAL_COMMUNITY)
Admission: RE | Admit: 2020-05-18 | Discharge: 2020-05-18 | Disposition: A | Discharge status: Home / Self Care | Payer: Medicaid Other | Source: Ambulatory Visit | Attending: Advanced Practice Midwife | Admitting: Advanced Practice Midwife

## 2020-05-18 ENCOUNTER — Encounter: Payer: Self-pay | Admitting: Advanced Practice Midwife

## 2020-05-18 VITALS — BP 123/74 | HR 66 | Ht 65.0 in | Wt 245.0 lb

## 2020-05-18 DIAGNOSIS — Z0000 Encounter for general adult medical examination without abnormal findings: Secondary | ICD-10-CM | POA: Diagnosis not present

## 2020-05-18 DIAGNOSIS — Z01419 Encounter for gynecological examination (general) (routine) without abnormal findings: Secondary | ICD-10-CM | POA: Diagnosis not present

## 2020-05-18 MED ORDER — MISOPROSTOL 200 MCG PO TABS: ORAL_TABLET | ORAL | 0 refills | Status: DC

## 2020-05-18 MED ORDER — VALACYCLOVIR HCL 500 MG PO TABS: 500.0000 mg | ORAL_TABLET | Freq: Every day | ORAL | 11 refills | Status: DC

## 2020-05-18 NOTE — Progress Notes (Signed)
Jasmine Maynard 29 y.o.  Vitals:   05/18/20 1414  BP: 123/74  Pulse: 66     Filed Weights   05/18/20 1414  Weight: 245 lb (111.1 kg)    Past Medical History: Past Medical History:  Diagnosis Date  . ADHD (attention deficit hyperactivity disorder)   . Asthma   . DDD (degenerative disc disease)    in back  . Depression    fine problem  . Heartburn   . Herpes   . Perennial allergic rhinitis   . Pregnant 01/21/2015  . Pregnant 01/26/2016  . Vertigo     Past Surgical History: Past Surgical History:  Procedure Laterality Date  . CHOLECYSTECTOMY  02/13/2012   Procedure: LAPAROSCOPIC CHOLECYSTECTOMY;  Surgeon: Fabio Bering, MD;  Location: [facility];  Service: General;  Laterality: N/A;  . INTRAUTERINE DEVICE INSERTION  2011  . MYRINGOTOMY  2011   right    Family History: Family History  Problem Relation Age of Onset  . Diabetes Paternal Grandfather   . Heart attack Paternal Grandfather   . Other Paternal Grandfather        aneursym  . Diabetes Paternal Grandmother   . Cancer Paternal Grandmother        breast,lung  . Other Maternal Grandmother        heart issues; has stent; has a eating disorder  . COPD Father   . Other Father        heart issues  . COPD Mother   . Mental illness Sister   . Autism Other     Social History: Social History   Tobacco Use  . Smoking status: Never Smoker  . Smokeless tobacco: Never Used  Vaping Use  . Vaping Use: Never used  Substance Use Topics  . Alcohol use: No  . Drug use: No    Allergies:  Allergies  Allergen Reactions  . Amoxicillin     Reaction: INEFFECTIVE Has patient had a PCN reaction causing immediate rash, facial/tongue/throat swelling, SOB or lightheadedness with hypotension: No Has patient had a PCN reaction causing severe rash involving mucus membranes or skin necrosis: No Has patient had a PCN reaction that required hospitalization No Has patient had a PCN reaction occurring within the last 10 years:  No If all of the above answers are "NO", then may proceed with Cephalosporin use.   . Avelox [Moxifloxacin Hcl In Nacl] Diarrhea and Nausea And Vomiting  . Cefdinir     ineffective  . Doxycycline Nausea And Vomiting  . Latex Rash      Current Outpatient Medications:  .  acetaminophen (TYLENOL) 325 MG tablet, Take 650 mg by mouth every 6 (six) hours as needed., Disp: , Rfl:  .  triamcinolone (NASACORT ALLERGY 24HR) 55 MCG/ACT AERO nasal inhaler, Place 2 sprays into the nose daily., Disp: , Rfl:  .  misoprostol (CYTOTEC) 200 MCG tablet, Take 2 tablets by mouth 5-6 hours before your next clinic appointment, Disp: 2 tablet, Rfl: 0 .  valACYclovir (VALTREX) 500 MG tablet, Take 1 tablet (500 mg total) by mouth daily., Disp: 30 tablet, Rfl: 11  History of Present Illness: here for pap and physical.  Last one 2016, normal.  Husband had vasectomy but periods are heavy and irregular.  Was amenorrheic on Mirena in the past, may get that.     Review of Systems   Patient denies any headaches, blurred vision, shortness of breath, chest pain, abdominal pain, problems with bowel movements, urination, or intercourse.   Physical Exam:  General:  Well developed, well nourished, no acute distress Skin:  Warm and dry Neck:  Midline trachea, normal thyroid Lungs; Clear to auscultation bilaterally Breast:  No dominant palpable mass, retraction, or nipple discharge Cardiovascular: Regular rate and rhythm Abdomen:  Soft, non tender, no hepatosplenomegaly Pelvic:  External genitalia is normal in appearance.  The vagina is normal in appearance.  The cervix is bulbous.  Uterus is felt to be normal size, shape, and contour.  No adnexal masses or tenderness noted.  Extremities:  No swelling or varicosities noted Psych:  No mood changes.     Impression: normal GYN exam.   Dysmenorrhea     Plan: If wants to get IUD, plan for when on period. Cytote sent to pharmacy

## 2020-05-20 LAB — CYTOLOGY - PAP
Comment: NEGATIVE
Diagnosis: NEGATIVE
High risk HPV: NEGATIVE

## 2020-10-31 ENCOUNTER — Other Ambulatory Visit: Payer: Self-pay

## 2020-10-31 ENCOUNTER — Ambulatory Visit
Admission: EM | Admit: 2020-10-31 | Discharge: 2020-10-31 | Disposition: A | Discharge status: Home / Self Care | Payer: Medicaid Other | Attending: Internal Medicine | Admitting: Internal Medicine

## 2020-10-31 DIAGNOSIS — U071 COVID-19: Secondary | ICD-10-CM | POA: Diagnosis not present

## 2020-10-31 MED ORDER — IBUPROFEN 600 MG PO TABS: 600.0000 mg | ORAL_TABLET | Freq: Four times a day (QID) | ORAL | 0 refills | Status: DC | PRN

## 2020-10-31 MED ORDER — ONDANSETRON 4 MG PO TBDP: 4.0000 mg | ORAL_TABLET | Freq: Three times a day (TID) | ORAL | 0 refills | Status: DC | PRN

## 2020-10-31 MED ORDER — ALBUTEROL SULFATE HFA 108 (90 BASE) MCG/ACT IN AERS: 1.0000 | INHALATION_SPRAY | Freq: Four times a day (QID) | RESPIRATORY_TRACT | 0 refills | Status: DC | PRN

## 2020-10-31 MED ORDER — BENZONATATE 100 MG PO CAPS: 100.0000 mg | ORAL_CAPSULE | Freq: Three times a day (TID) | ORAL | 0 refills | Status: DC | PRN

## 2020-10-31 NOTE — ED Provider Notes (Signed)
[facility]    CSN: [id number] Arrival date & time: 10/31/20  1056      History   Chief Complaint Chief Complaint  Patient presents with  . Cough    HPI Jasmine Maynard is a 29 y.o. female diagnosed with COVID-19 infection on 12/3 comes to urgent care with worsening cough, shortness of breath, nausea and chest pain.  Patient says cough is not productive of sputum.  She has some chest tightness and chest pain after bouts of cough.  No fever or chills.  No vomiting or diarrhea.  She has been nauseated over the past couple of days.  No dizziness, near syncope or syncopal episodes.Marland Kitchen   HPI  Past Medical History:  Diagnosis Date  . ADHD (attention deficit hyperactivity disorder)   . Asthma   . DDD (degenerative disc disease)    in back  . Depression    fine problem  . Heartburn   . Herpes   . Perennial allergic rhinitis   . Pregnant 01/21/2015  . Pregnant 01/26/2016  . Vertigo     Patient Active Problem List   Diagnosis Date Noted  . Finger fracture, right 05/22/2019  . Rh sensitization   . 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 GERD (gastroesophageal reflux disease) 02/09/2015  . Constipation 02/09/2015  . Heartburn 01/21/2015  . Vertigo 01/21/2015  . 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  . Chronic low back pain 09/17/2013  . Depression with anxiety 02/11/2013  . ADHD (attention deficit hyperactivity disorder) 02/11/2013    Past Surgical History:  Procedure Laterality Date  . CHOLECYSTECTOMY  02/13/2012   Procedure: LAPAROSCOPIC CHOLECYSTECTOMY;  Surgeon: Fabio Bering, MD;  Location: [facility];  Service: General;  Laterality: N/A;  . INTRAUTERINE DEVICE INSERTION  2011  . MYRINGOTOMY  2011   right    OB History    Gravida  3   Para  3   Term  3   Preterm      AB      Living  3     SAB      IAB      Ectopic      Multiple  0   Live Births  3            Home Medications    Prior to Admission medications   Medication Sig  Start Date End Date Taking? Authorizing Provider  acetaminophen (TYLENOL) 325 MG tablet Take 650 mg by mouth every 6 (six) hours as needed.    [provider]  albuterol (VENTOLIN HFA) 108 (90 Base) MCG/ACT inhaler Inhale 1 puff into the lungs every 6 (six) hours as needed for wheezing or shortness of breath. 10/31/20   Wilda Wetherell, Britta Mccreedy, MD  benzonatate (TESSALON) 100 MG capsule Take 1 capsule (100 mg total) by mouth 3 (three) times daily as needed for cough. 10/31/20   Merrilee Jansky, MD  ibuprofen (ADVIL) 600 MG tablet Take 1 tablet (600 mg total) by mouth every 6 (six) hours as needed. 10/31/20   Merrilee Jansky, MD  misoprostol (CYTOTEC) 200 MCG tablet Take 2 tablets by mouth 5-6 hours before your next clinic appointment 05/18/20   Cresenzo-Dishmon, Scarlette Calico, CNM  ondansetron (ZOFRAN ODT) 4 MG disintegrating tablet Take 1 tablet (4 mg total) by mouth every 8 (eight) hours as needed for nausea or vomiting. 10/31/20   Tylin Force, Britta Mccreedy, MD  triamcinolone (NASACORT ALLERGY 24HR) 55 MCG/ACT AERO nasal inhaler Place 2 sprays into the nose daily.  [provider]  valACYclovir (VALTREX) 500 MG tablet Take 1 tablet (500 mg total) by mouth daily. 05/18/20   Cresenzo-Dishmon, Scarlette Calico, CNM  dicyclomine (BENTYL) 20 MG tablet Take 1 tablet (20 mg total) by mouth every 6 (six) hours as needed (abdominal cramping). 01/23/12 02/09/12  Samuel Jester, DO    Family History Family History  Problem Relation Age of Onset  . Diabetes Paternal Grandfather   . Heart attack Paternal Grandfather   . Other Paternal Grandfather        aneursym  . Diabetes Paternal Grandmother   . Cancer Paternal Grandmother        breast,lung  . Other Maternal Grandmother        heart issues; has stent; has a eating disorder  . COPD Father   . Other Father        heart issues  . COPD Mother   . Mental illness Sister   . Autism Other     Social History Social History   Tobacco Use  . Smoking  status: Never Smoker  . Smokeless tobacco: Never Used  Vaping Use  . Vaping Use: Never used  Substance Use Topics  . Alcohol use: No  . Drug use: No     Allergies   Amoxicillin, Avelox [moxifloxacin hcl in nacl], Cefdinir, Doxycycline, and Latex   Review of Systems Review of Systems  Constitutional: Negative.  Negative for chills and fever.  HENT: Positive for congestion and sore throat.   Respiratory: Positive for cough and chest tightness. Negative for shortness of breath and wheezing.   Cardiovascular: Positive for chest pain.  Gastrointestinal: Positive for nausea. Negative for abdominal distention, diarrhea and vomiting.  Genitourinary: Negative.   Neurological: Negative for headaches.     Physical Exam Triage Vital Signs ED Triage Vitals [10/31/20 1115]  Enc Vitals Group     BP 104/72     Pulse Rate 95     Resp 20     Temp 99.1 F (37.3 C)     Temp src      SpO2 96 %     Weight      Height      Head Circumference      Peak Flow      Pain Score      Pain Loc      Pain Edu?      Excl. in GC?    No data found.  Updated Vital Signs BP 104/72   Pulse 95   Temp 99.1 F (37.3 C)   Resp 20   LMP 10/01/2020 (Approximate)   SpO2 96%   Visual Acuity Right Eye Distance:   Left Eye Distance:   Bilateral Distance:    Right Eye Near:   Left Eye Near:    Bilateral Near:     Physical Exam Vitals and nursing note reviewed.  Constitutional:      General: She is in acute distress.     Appearance: She is not ill-appearing.  HENT:     Right Ear: Tympanic membrane normal.     Left Ear: Tympanic membrane normal.     Nose: No congestion or rhinorrhea.     Mouth/Throat:     Pharynx: No oropharyngeal exudate or posterior oropharyngeal erythema.  Cardiovascular:     Rate and Rhythm: Normal rate and regular rhythm.     Pulses: Normal pulses.     Heart sounds: Normal heart sounds.  Pulmonary:     Effort: Pulmonary effort is normal. No  respiratory distress.      Breath sounds: Normal breath sounds. No wheezing.  Musculoskeletal:        General: Normal range of motion.  Skin:    General: Skin is warm.      UC Treatments / Results  Labs (all labs ordered are listed, but only abnormal results are displayed) Labs Reviewed - No data to display  EKG   Radiology No results found.  Procedures Procedures (including critical care time)  Medications Ordered in UC Medications - No data to display  Initial Impression / Assessment and Plan / UC Course  I have reviewed the triage vital signs and the nursing notes.  Pertinent labs & imaging results that were available during my care of the patient were reviewed by me and considered in my medical decision making (see chart for details).    1.  COVID-19 infection: Tessalon Perles as needed for cough Zofran as needed for nausea Albuterol inhaler as needed for shortness of breath/chest tightness/wheezing Patient continues to quarantine Increase oral fluid intake If patient experiences any worsening pain she can return to the urgent care to be reevaluated. Lung exam is unrevealing.  No indication for x-rays at this time. Final Clinical Impressions(s) / UC Diagnoses   Final diagnoses:  COVID-19 virus infection     Discharge Instructions     Increase oral fluid intake Take medications as directed If symptoms worsen-feel free to reach out to [place] for further recommendations.   ED Prescriptions    Medication Sig Dispense Auth. Provider   benzonatate (TESSALON) 100 MG capsule Take 1 capsule (100 mg total) by mouth 3 (three) times daily as needed for cough. 30 capsule Dayvian Blixt, Britta Mccreedy, MD   albuterol (VENTOLIN HFA) 108 (90 Base) MCG/ACT inhaler Inhale 1 puff into the lungs every 6 (six) hours as needed for wheezing or shortness of breath. 18 g Merrilee Jansky, MD   ibuprofen (ADVIL) 600 MG tablet Take 1 tablet (600 mg total) by mouth every 6 (six) hours as needed. 30 tablet Belicia Difatta, Britta Mccreedy, MD   ondansetron (ZOFRAN ODT) 4 MG disintegrating tablet Take 1 tablet (4 mg total) by mouth every 8 (eight) hours as needed for nausea or vomiting. 20 tablet Dajiah Kooi, Britta Mccreedy, MD     PDMP not reviewed this encounter.   Merrilee Jansky, MD 10/31/20 1306

## 2020-10-31 NOTE — Discharge Instructions (Signed)
Increase oral fluid intake Take medications as directed If symptoms worsen-feel free to reach out to [place] for further recommendations.

## 2020-10-31 NOTE — ED Triage Notes (Signed)
Pt tested positive for covid on 12/3 pt states symptoms have become worse

## 2020-12-20 ENCOUNTER — Emergency Department (HOSPITAL_COMMUNITY): Payer: Medicaid Other

## 2020-12-20 ENCOUNTER — Encounter (HOSPITAL_COMMUNITY): Payer: Self-pay | Admitting: Emergency Medicine

## 2020-12-20 ENCOUNTER — Other Ambulatory Visit: Payer: Self-pay

## 2020-12-20 ENCOUNTER — Emergency Department (HOSPITAL_COMMUNITY)
Admission: EM | Admit: 2020-12-20 | Discharge: 2020-12-20 | Disposition: A | Discharge status: Home / Self Care | Payer: Medicaid Other | Attending: Emergency Medicine | Admitting: Emergency Medicine

## 2020-12-20 DIAGNOSIS — Z91040 Latex allergy status: Secondary | ICD-10-CM | POA: Diagnosis not present

## 2020-12-20 DIAGNOSIS — J45909 Unspecified asthma, uncomplicated: Secondary | ICD-10-CM | POA: Insufficient documentation

## 2020-12-20 DIAGNOSIS — E876 Hypokalemia: Secondary | ICD-10-CM | POA: Insufficient documentation

## 2020-12-20 DIAGNOSIS — R1031 Right lower quadrant pain: Secondary | ICD-10-CM | POA: Diagnosis present

## 2020-12-20 DIAGNOSIS — N049 Nephrotic syndrome with unspecified morphologic changes: Secondary | ICD-10-CM | POA: Diagnosis not present

## 2020-12-20 DIAGNOSIS — N132 Hydronephrosis with renal and ureteral calculous obstruction: Secondary | ICD-10-CM | POA: Insufficient documentation

## 2020-12-20 DIAGNOSIS — Z79899 Other long term (current) drug therapy: Secondary | ICD-10-CM | POA: Diagnosis not present

## 2020-12-20 DIAGNOSIS — N200 Calculus of kidney: Secondary | ICD-10-CM

## 2020-12-20 DIAGNOSIS — Z9049 Acquired absence of other specified parts of digestive tract: Secondary | ICD-10-CM | POA: Diagnosis not present

## 2020-12-20 DIAGNOSIS — N1330 Unspecified hydronephrosis: Secondary | ICD-10-CM | POA: Diagnosis not present

## 2020-12-20 DIAGNOSIS — N854 Malposition of uterus: Secondary | ICD-10-CM | POA: Diagnosis not present

## 2020-12-20 LAB — URINALYSIS, ROUTINE W REFLEX MICROSCOPIC
Bilirubin Urine: NEGATIVE
Glucose, UA: NEGATIVE mg/dL
Ketones, ur: 5 mg/dL — AB
Leukocytes,Ua: NEGATIVE
Nitrite: NEGATIVE
Protein, ur: 30 mg/dL — AB
RBC / HPF: >50 RBC/hpf — ABNORMAL HIGH (ref 0–5)
Specific Gravity, Urine: 1.009 (ref 1.005–1.030)
pH: 6 (ref 5.0–8.0)

## 2020-12-20 LAB — CBC WITH DIFFERENTIAL/PLATELET
Abs Immature Granulocytes: 0.02 10*3/uL (ref 0.00–0.07)
Basophils Absolute: 0 10*3/uL (ref 0.0–0.1)
Basophils Relative: 0 %
Eosinophils Absolute: 0.3 10*3/uL (ref 0.0–0.5)
Eosinophils Relative: 4 %
HCT: 42.8 % (ref 36.0–46.0)
Hemoglobin: 14.7 g/dL (ref 12.0–15.0)
Immature Granulocytes: 0 %
Lymphocytes Relative: 33 %
Lymphs Abs: 2.7 10*3/uL (ref 0.7–4.0)
MCH: 29.9 pg (ref 26.0–34.0)
MCHC: 34.3 g/dL (ref 30.0–36.0)
MCV: 87.2 fL (ref 80.0–100.0)
Monocytes Absolute: 0.7 10*3/uL (ref 0.1–1.0)
Monocytes Relative: 9 %
Neutro Abs: 4.5 10*3/uL (ref 1.7–7.7)
Neutrophils Relative %: 54 %
Platelets: 269 10*3/uL (ref 150–400)
RBC: 4.91 MIL/uL (ref 3.87–5.11)
RDW: 12 % (ref 11.5–15.5)
WBC: 8.3 10*3/uL (ref 4.0–10.5)
nRBC: 0 % (ref 0.0–0.2)

## 2020-12-20 LAB — COMPREHENSIVE METABOLIC PANEL
ALT: 28 U/L (ref 0–44)
AST: 23 U/L (ref 15–41)
Albumin: 4.4 g/dL (ref 3.5–5.0)
Alkaline Phosphatase: 56 U/L (ref 38–126)
Anion gap: 10 (ref 5–15)
BUN: 8 mg/dL (ref 6–20)
CO2: 22 mmol/L (ref 22–32)
Calcium: 9.4 mg/dL (ref 8.9–10.3)
Chloride: 102 mmol/L (ref 98–111)
Creatinine, Ser: 0.67 mg/dL (ref 0.44–1.00)
GFR, Estimated: >60 mL/min (ref >60)
Glucose, Bld: 109 mg/dL — ABNORMAL HIGH (ref 70–99)
Potassium: 3.1 mmol/L — ABNORMAL LOW (ref 3.5–5.1)
Sodium: 134 mmol/L — ABNORMAL LOW (ref 135–145)
Total Bilirubin: 0.6 mg/dL (ref 0.3–1.2)
Total Protein: 7.7 g/dL (ref 6.5–8.1)

## 2020-12-20 LAB — LIPASE, BLOOD: Lipase: 26 U/L (ref 11–51)

## 2020-12-20 LAB — PREGNANCY, URINE: Preg Test, Ur: NEGATIVE

## 2020-12-20 MED ORDER — HYDROCODONE-ACETAMINOPHEN 5-325 MG PO TABS: 1.0000 | ORAL_TABLET | ORAL | 0 refills | Status: DC | PRN

## 2020-12-20 MED ORDER — HYDROMORPHONE HCL 1 MG/ML IJ SOLN
1.0000 mg | Freq: Once | INTRAMUSCULAR | Status: AC
  Administered 2020-12-20: 1 mg via INTRAVENOUS
  Filled 2020-12-20: qty 1

## 2020-12-20 MED ORDER — ONDANSETRON 4 MG PO TBDP: 4.0000 mg | ORAL_TABLET | Freq: Three times a day (TID) | ORAL | 0 refills | Status: DC | PRN

## 2020-12-20 MED ORDER — NAPROXEN 500 MG PO TABS: 500.0000 mg | ORAL_TABLET | Freq: Two times a day (BID) | ORAL | 0 refills | Status: DC

## 2020-12-20 MED ORDER — HYDROCODONE-ACETAMINOPHEN 5-325 MG PO TABS: 1.0000 | ORAL_TABLET | Freq: Four times a day (QID) | ORAL | 0 refills | Status: DC | PRN

## 2020-12-20 MED ORDER — KETOROLAC TROMETHAMINE 30 MG/ML IJ SOLN
30.0000 mg | Freq: Once | INTRAMUSCULAR | Status: AC
Start: 2020-12-20 — End: 2020-12-20
  Administered 2020-12-20: 30 mg via INTRAVENOUS
  Filled 2020-12-20: qty 1

## 2020-12-20 MED ORDER — ONDANSETRON HCL 4 MG PO TABS: 4.0000 mg | ORAL_TABLET | Freq: Three times a day (TID) | ORAL | 0 refills | Status: DC | PRN

## 2020-12-20 NOTE — Discharge Instructions (Signed)
Your testing shows that you have a 4 mm kidney stone, hopefully this will pass quickly. Please take the medications exactly as prescribed, return to the emergency department for severe or worsening symptoms  If your urine sample grows infection you will be called to let you know

## 2020-12-20 NOTE — ED Triage Notes (Signed)
Pt to the ED POV with RLQ abdominal Pain that began 2 hours ago progressively getting worse.  Denies urinary symptoms, denies having eaten anything prior to pain.

## 2020-12-20 NOTE — ED Provider Notes (Signed)
[facility] EMERGENCY DEPARTMENT Provider Note   CSN: [id number] Arrival date & time: 12/20/20  2108     History Chief Complaint  Patient presents with  . Abdominal Pain    Jasmine Maynard is a 30 y.o. female.  HPI   This patient is a 31 year old female, she has a history of prior cholecystectomy, she has never had a kidney stone, she has had bladder infections in the past.  She presents to the hospital today with complaint of right lower quadrant pain.  This started several hours ago.  It has been rather persistent but she has waves of severe pain that come on with it.  She has no hematuria, dysuria or fevers or chills.  She has never had a kidney stone that she is aware of.  At this time her symptoms are severe, no medications given prehospital.  Past Medical History:  Diagnosis Date  . ADHD (attention deficit hyperactivity disorder)   . Asthma   . DDD (degenerative disc disease)    in back  . Depression    fine problem  . Heartburn   . Herpes   . Perennial allergic rhinitis   . Pregnant 01/21/2015  . Pregnant 01/26/2016  . Vertigo     Patient Active Problem List   Diagnosis Date Noted  . Finger fracture, right 05/22/2019  . Rh sensitization   . 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 GERD (gastroesophageal reflux disease) 02/09/2015  . Constipation 02/09/2015  . Heartburn 01/21/2015  . Vertigo 01/21/2015  . 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  . Chronic low back pain 09/17/2013  . Depression with anxiety 02/11/2013  . ADHD (attention deficit hyperactivity disorder) 02/11/2013    Past Surgical History:  Procedure Laterality Date  . CHOLECYSTECTOMY  02/13/2012   Procedure: LAPAROSCOPIC CHOLECYSTECTOMY;  Surgeon: Fabio Bering, MD;  Location: [facility];  Service: General;  Laterality: N/A;  . INTRAUTERINE DEVICE INSERTION  2011  . MYRINGOTOMY  2011   right     OB History    Gravida  3   Para  3   Term  3   Preterm      AB      Living  3     SAB       IAB      Ectopic      Multiple  0   Live Births  3           Family History  Problem Relation Age of Onset  . Diabetes Paternal Grandfather   . Heart attack Paternal Grandfather   . Other Paternal Grandfather        aneursym  . Diabetes Paternal Grandmother   . Cancer Paternal Grandmother        breast,lung  . Other Maternal Grandmother        heart issues; has stent; has a eating disorder  . COPD Father   . Other Father        heart issues  . COPD Mother   . Mental illness Sister   . Autism Other     Social History   Tobacco Use  . Smoking status: Never Smoker  . Smokeless tobacco: Never Used  Vaping Use  . Vaping Use: Never used  Substance Use Topics  . Alcohol use: Yes    Alcohol/week: 2.0 standard drinks    Types: 2 Cans of beer per week    Comment: occ  . Drug use: No    Home Medications Prior to Admission  medications   Medication Sig Start Date End Date Taking? Authorizing Provider  HYDROcodone-acetaminophen (NORCO/VICODIN) 5-325 MG tablet Take 1 tablet by mouth every 4 (four) hours as needed for severe pain. 12/20/20  Yes Eber Hong, MD  HYDROcodone-acetaminophen (NORCO/VICODIN) 5-325 MG tablet Take 1 tablet by mouth every 6 (six) hours as needed. 12/20/20  Yes Eber Hong, MD  naproxen (NAPROSYN) 500 MG tablet Take 1 tablet (500 mg total) by mouth 2 (two) times daily with a meal. 12/20/20  Yes Eber Hong, MD  ondansetron (ZOFRAN ODT) 4 MG disintegrating tablet Take 1 tablet (4 mg total) by mouth every 8 (eight) hours as needed for nausea. 12/20/20  Yes Eber Hong, MD  ondansetron (ZOFRAN) 4 MG tablet Take 1 tablet (4 mg total) by mouth every 8 (eight) hours as needed for nausea or vomiting. 12/20/20  Yes Eber Hong, MD  acetaminophen (TYLENOL) 325 MG tablet Take 650 mg by mouth every 6 (six) hours as needed.    [provider]  albuterol (VENTOLIN HFA) 108 (90 Base) MCG/ACT inhaler Inhale 1 puff into the lungs every 6 (six) hours as  needed for wheezing or shortness of breath. 10/31/20   Lamptey, Britta Mccreedy, MD  benzonatate (TESSALON) 100 MG capsule Take 1 capsule (100 mg total) by mouth 3 (three) times daily as needed for cough. 10/31/20   Merrilee Jansky, MD  ibuprofen (ADVIL) 600 MG tablet Take 1 tablet (600 mg total) by mouth every 6 (six) hours as needed. 10/31/20   Merrilee Jansky, MD  misoprostol (CYTOTEC) 200 MCG tablet Take 2 tablets by mouth 5-6 hours before your next clinic appointment 05/18/20   Cresenzo-Dishmon, Scarlette Calico, CNM  triamcinolone (NASACORT ALLERGY 24HR) 55 MCG/ACT AERO nasal inhaler Place 2 sprays into the nose daily.    [provider]  valACYclovir (VALTREX) 500 MG tablet Take 1 tablet (500 mg total) by mouth daily. 05/18/20   Cresenzo-Dishmon, Scarlette Calico, CNM  dicyclomine (BENTYL) 20 MG tablet Take 1 tablet (20 mg total) by mouth every 6 (six) hours as needed (abdominal cramping). 01/23/12 02/09/12  Samuel Jester, DO    Allergies    Amoxicillin, Avelox [moxifloxacin hcl in nacl], Cefdinir, Doxycycline, and Latex  Review of Systems   Review of Systems  All other systems reviewed and are negative.   Physical Exam Updated Vital Signs BP (!) 179/95 (BP Location: Right Arm)   Pulse 100   Temp 98.7 F (37.1 C) (Oral)   Resp 20   Ht 1.626 m (5\' 4" )   Wt 104.3 kg   LMP  (LMP Unknown)   SpO2 100%   BMI 39.48 kg/m   Physical Exam Vitals and nursing note reviewed.  Constitutional:      General: She is in acute distress.     Appearance: She is well-developed and well-nourished.  HENT:     Head: Normocephalic and atraumatic.     Mouth/Throat:     Mouth: Oropharynx is clear and moist.     Pharynx: No oropharyngeal exudate.  Eyes:     General: No scleral icterus.       Right eye: No discharge.        Left eye: No discharge.     Extraocular Movements: EOM normal.     Conjunctiva/sclera: Conjunctivae normal.     Pupils: Pupils are equal, round, and reactive to light.  Neck:      Thyroid: No thyromegaly.     Vascular: No JVD.  Cardiovascular:     Rate and Rhythm: Normal  rate and regular rhythm.     Pulses: Intact distal pulses.     Heart sounds: Normal heart sounds. No murmur heard. No friction rub. No gallop.   Pulmonary:     Effort: Pulmonary effort is normal. No respiratory distress.     Breath sounds: Normal breath sounds. No wheezing or rales.  Abdominal:     General: Bowel sounds are normal. There is no distension.     Palpations: Abdomen is soft. There is no mass.     Tenderness: There is abdominal tenderness.     Comments: Mild to moderate tenderness in the right lower quadrant and suprapubic area, very soft abdomen diffusely without guarding or peritoneal signs, no CVA tenderness  Musculoskeletal:        General: No tenderness or edema. Normal range of motion.     Cervical back: Normal range of motion and neck supple.  Lymphadenopathy:     Cervical: No cervical adenopathy.  Skin:    General: Skin is warm and dry.     Findings: No erythema or rash.  Neurological:     Mental Status: She is alert.     Coordination: Coordination normal.  Psychiatric:        Mood and Affect: Mood and affect normal.        Behavior: Behavior normal.     ED Results / Procedures / Treatments   Labs (all labs ordered are listed, but only abnormal results are displayed) Labs Reviewed  COMPREHENSIVE METABOLIC PANEL - Abnormal; Notable for the following components:      Result Value   Sodium 134 (*)    Potassium 3.1 (*)    Glucose, Bld 109 (*)    All other components within normal limits  URINALYSIS, ROUTINE W REFLEX MICROSCOPIC - Abnormal; Notable for the following components:   Hgb urine dipstick LARGE (*)    Ketones, ur 5 (*)    Protein, ur 30 (*)    RBC / HPF >50 (*)    Bacteria, UA RARE (*)    All other components within normal limits  URINE CULTURE  CBC WITH DIFFERENTIAL/PLATELET  PREGNANCY, URINE  LIPASE, BLOOD    EKG None  Radiology CT Renal  Stone Study  Result Date: 12/20/2020 CLINICAL DATA:  Flank pain.  Right-sided abdominal pain. EXAM: CT ABDOMEN AND PELVIS WITHOUT CONTRAST TECHNIQUE: Multidetector CT imaging of the abdomen and pelvis was performed following the standard protocol without IV contrast. COMPARISON:  CT 05/25/2018 FINDINGS: Lower chest: The lung bases are clear. Hepatobiliary: No focal hepatic abnormality. Cholecystectomy with stable common bile duct prominence. Normal tapering at the duodenum. No visualized biliary stone. Pancreas: No ductal dilatation or inflammation. Spleen: Elongated spanning 13.8 cm AP.  Punctate granuloma. Adrenals/Urinary Tract: No adrenal nodule. There is an obstructing 4 mm stone at the right ureterovesicular junction with moderate hydroureteronephrosis. Minimal right perinephric edema. No additional nonobstructing renal calculi. There is no left hydronephrosis or stone. Left ureter is decompressed. Urinary bladder is near completely empty. Stomach/Bowel: Decompressed stomach. Normal positioning of the duodenum and ligament of Treitz. No bowel obstruction or inflammation. Slight fecalization of distal small bowel contents. Normal appendix. Small to moderate colonic stool burden. Vascular/Lymphatic: Normal caliber abdominal aorta. No bulky abdominopelvic adenopathy. Reproductive: Anteverted uterus. Normal appearance of the ovaries. No adnexal mass. Other: No free air or free fluid. 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 There are no acute or suspicious osseous abnormalities. IMPRESSION: Obstructing 4 mm  stone at the right ureterovesicular junction with moderate hydronephrosis. Electronically Signed   By: Narda Rutherford M.D.   On: 12/20/2020 22:47    Procedures Procedures   Medications Ordered in ED Medications  HYDROmorphone (DILAUDID) injection 1 mg (1 mg Intravenous Given 12/20/20 2140)  ketorolac (TORADOL) 30  MG/ML injection 30 mg (30 mg Intravenous Given 12/20/20 2141)    ED Course  I have reviewed the triage vital signs and the nursing notes.  Pertinent labs & imaging results that were available during my care of the patient were reviewed by me and considered in my medical decision making (see chart for details).    MDM Rules/Calculators/A&P                          The patient will need to be evaluated for pregnancy, ruptured ectopic, appendicitis or possibly kidney stone.  Given the acute onset of this and the colicky nature kidney stone is certainly high at the top of the list of differential.  Labs drawn, pain medications ordered, the patient is agreeable to the plan  CT scan confirms a distal ureteral stone, there is hemoglobin in the urine, CBC normal, metabolic panel unremarkable other than minimal hypokalemia. No renal dysfunction despite having some hydronephrosis on CT scan. Patient given pain medications here and feels much better, agreeable for discharge, urine culture sent  Final Clinical Impression(s) / ED Diagnoses Final diagnoses:  Kidney stone    Rx / DC Orders ED Discharge Orders         Ordered    HYDROcodone-acetaminophen (NORCO/VICODIN) 5-325 MG tablet  Every 4 hours PRN        12/20/20 2306    ondansetron (ZOFRAN) 4 MG tablet  Every 8 hours PRN        12/20/20 2306    naproxen (NAPROSYN) 500 MG tablet  2 times daily with meals        12/20/20 2308    ondansetron (ZOFRAN ODT) 4 MG disintegrating tablet  Every 8 hours PRN        12/20/20 2308    HYDROcodone-acetaminophen (NORCO/VICODIN) 5-325 MG tablet  Every 6 hours PRN        12/20/20 2308           Eber Hong, MD 12/20/20 2311

## 2020-12-21 MED FILL — Hydrocodone-Acetaminophen Tab 5-325 MG: ORAL | Qty: 6 | Status: AC

## 2020-12-22 LAB — URINE CULTURE: Culture: <10000 — AB

## 2021-03-24 IMAGING — DX RIGHT HAND - COMPLETE 3+ VIEW
3 series · 3 of 3 positions shown · non-contrast
Comparison: None.

CLINICAL DATA: Fell today and injured middle finger.

EXAM:
RIGHT HAND - COMPLETE 3+ VIEW

[hand pa]
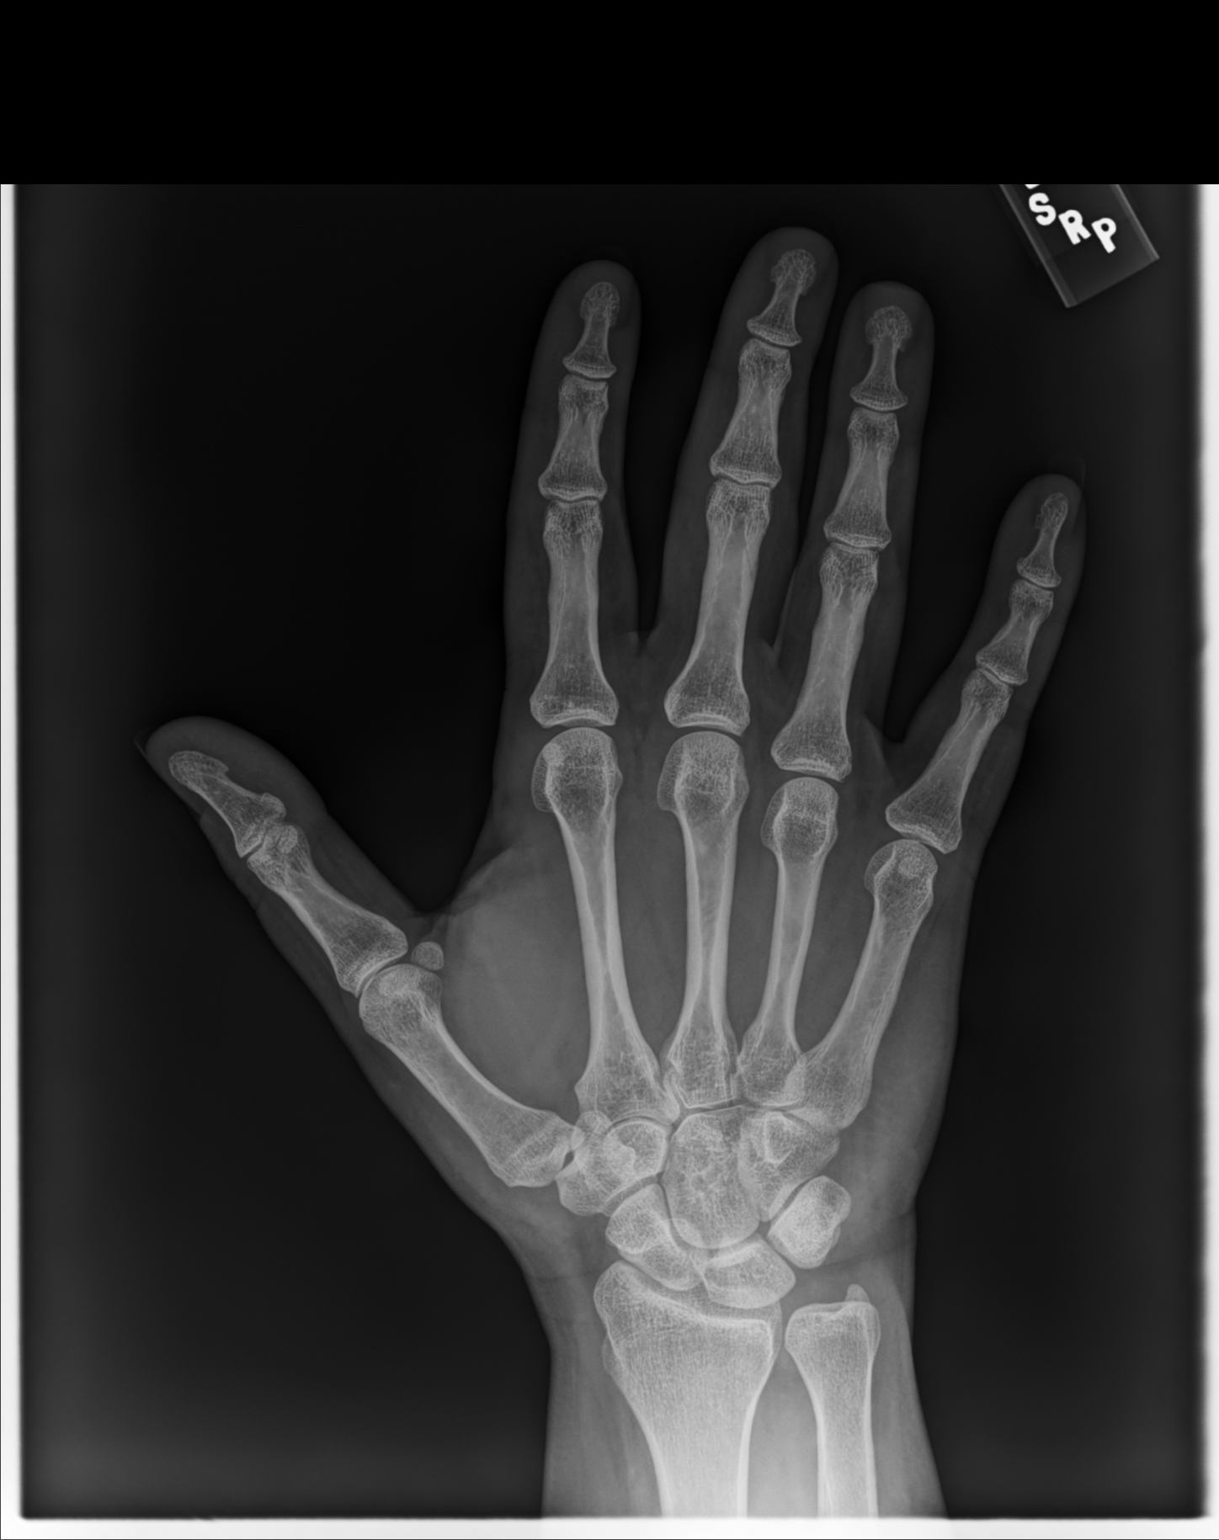

[hand mlo]
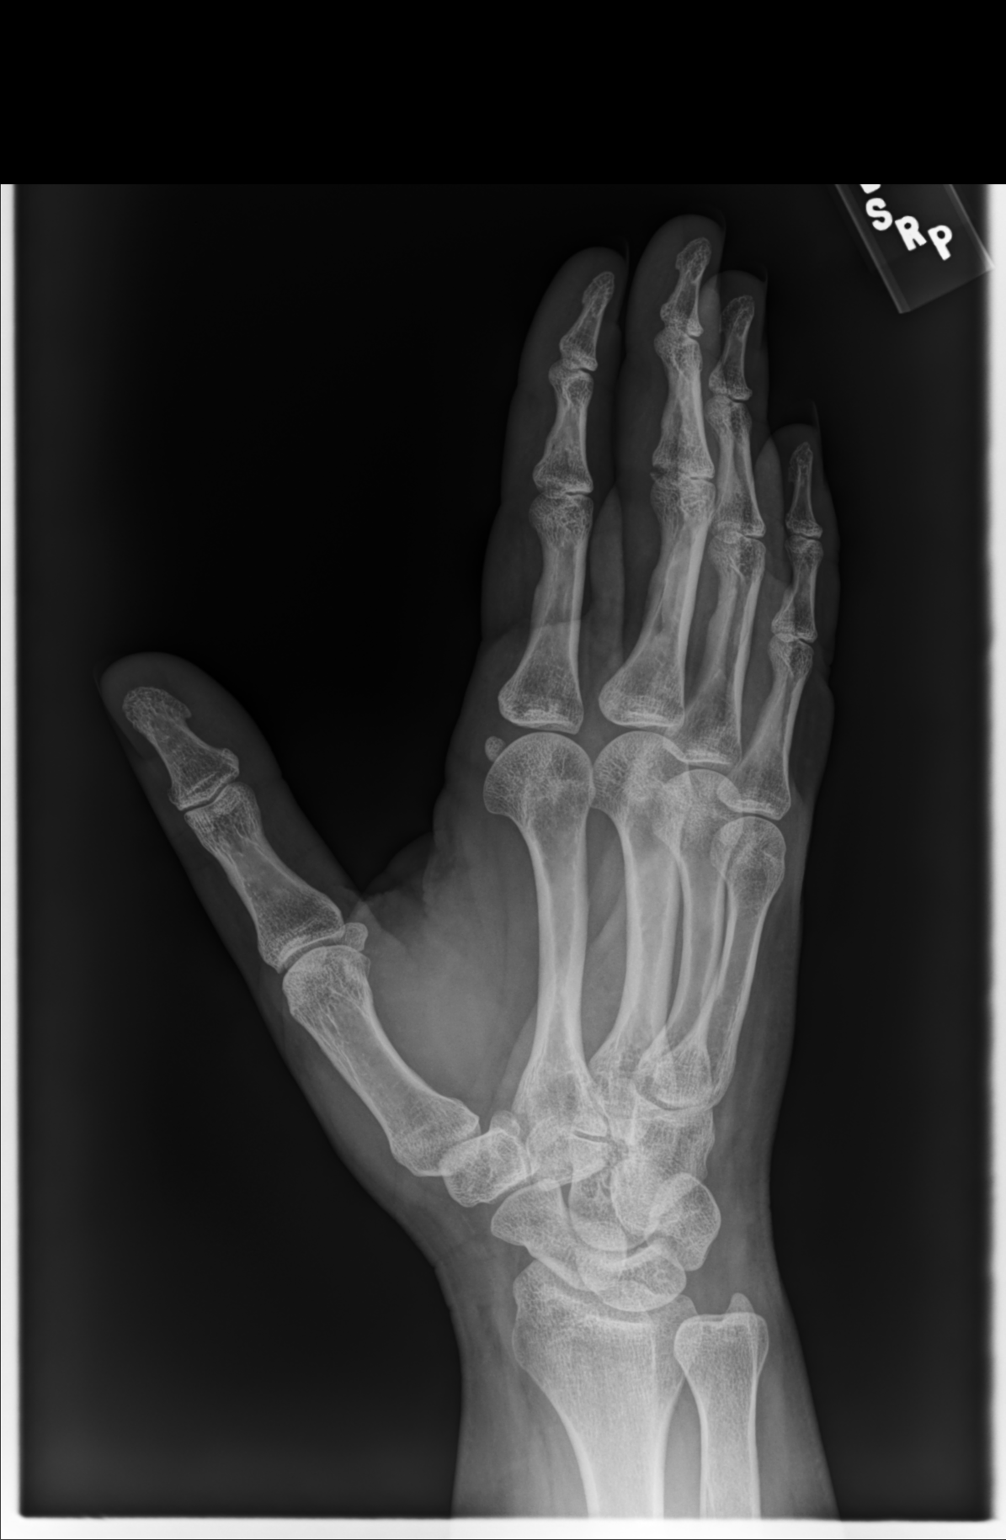

[hand lat]
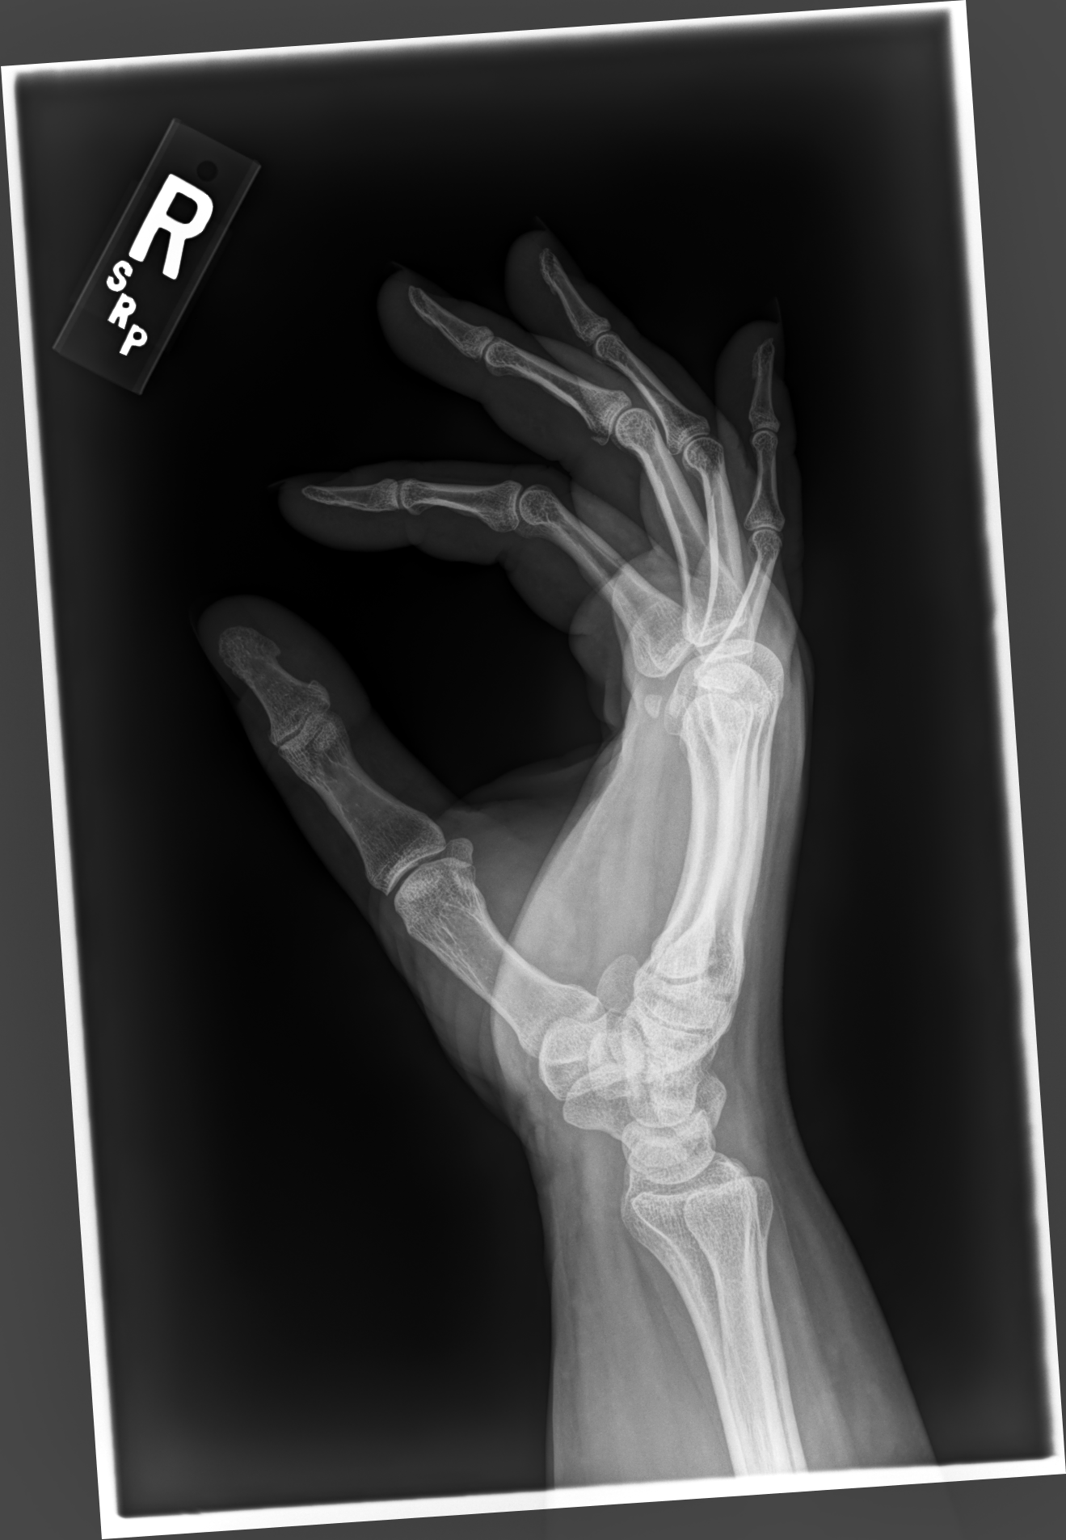

[3 of 3 positions shown; findings below may reference images not displayed]

FINDINGS: There is a volar plate avulsion fracture involving the middle
phalanx at the PIP joint. The other bony structures are intact.
IMPRESSION: Volar plate avulsion fracture at the PIP joint of the long finger.

## 2021-04-12 ENCOUNTER — Encounter: Payer: Self-pay | Admitting: Emergency Medicine

## 2021-04-12 ENCOUNTER — Other Ambulatory Visit: Payer: Self-pay

## 2021-04-12 ENCOUNTER — Ambulatory Visit
Admission: EM | Admit: 2021-04-12 | Discharge: 2021-04-12 | Disposition: A | Discharge status: Home / Self Care | Payer: Medicaid Other | Attending: Family Medicine | Admitting: Family Medicine

## 2021-04-12 DIAGNOSIS — J069 Acute upper respiratory infection, unspecified: Secondary | ICD-10-CM

## 2021-04-12 DIAGNOSIS — J209 Acute bronchitis, unspecified: Secondary | ICD-10-CM

## 2021-04-12 MED ORDER — PREDNISONE 10 MG (21) PO TBPK
ORAL_TABLET | Freq: Every day | ORAL | 0 refills | Status: AC
Start: 2021-04-12 — End: 2021-04-18

## 2021-04-12 MED ORDER — LEVOFLOXACIN 500 MG PO TABS: 500.0000 mg | ORAL_TABLET | Freq: Every day | ORAL | 0 refills | Status: DC

## 2021-04-12 MED ORDER — PROMETHAZINE-DM 6.25-15 MG/5ML PO SYRP: 5.0000 mL | ORAL_SOLUTION | Freq: Four times a day (QID) | ORAL | 0 refills | Status: DC | PRN

## 2021-04-12 NOTE — ED Provider Notes (Signed)
[facility]    CSN: [id number] Arrival date & time: 04/12/21  1502      History   Chief Complaint Chief Complaint  Patient presents with  . Cough  . Facial Pain  . ear pressure  . Sore Throat    HPI Jasmine Maynard is a 30 y.o. female.   Reports nasal congestion, sore throat, sinus pressure and bilateral ear fullness for the last 2 weeks. Has used OTC cough and cold with mild temporary relief. Denies sick contacts. Has positive hx Covid. Has not completed Covid vaccines. Has not completed flu vaccine. Denies abdominal pain, SOB, nausea, vomiting, diarrhea, rash, fever, other symptoms.   ROS per HPI   The history is provided by the patient.  Cough Sore Throat    Past Medical History:  Diagnosis Date  . ADHD (attention deficit hyperactivity disorder)   . Asthma   . DDD (degenerative disc disease)    in back  . Depression    fine problem  . Heartburn   . Herpes   . Perennial allergic rhinitis   . Pregnant 01/21/2015  . Pregnant 01/26/2016  . Vertigo     Patient Active Problem List   Diagnosis Date Noted  . Finger fracture, right 05/22/2019  . Rh sensitization   . 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 GERD (gastroesophageal reflux disease) 02/09/2015  . Constipation 02/09/2015  . Heartburn 01/21/2015  . Vertigo 01/21/2015  . 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  . Chronic low back pain 09/17/2013  . Depression with anxiety 02/11/2013  . ADHD (attention deficit hyperactivity disorder) 02/11/2013    Past Surgical History:  Procedure Laterality Date  . CHOLECYSTECTOMY  02/13/2012   Procedure: LAPAROSCOPIC CHOLECYSTECTOMY;  Surgeon: Fabio Bering, MD;  Location: [facility];  Service: General;  Laterality: N/A;  . INTRAUTERINE DEVICE INSERTION  2011  . MYRINGOTOMY  2011   right    OB History    Gravida  3   Para  3   Term  3   Preterm      AB      Living  3     SAB      IAB      Ectopic      Multiple  0   Live Births  3             Home Medications    Prior to Admission medications   Medication Sig Start Date End Date Taking? Authorizing Provider  levofloxacin (LEVAQUIN) 500 MG tablet Take 1 tablet (500 mg total) by mouth daily. 04/12/21  Yes Moshe Cipro, NP  predniSONE (STERAPRED UNI-PAK 21 TAB) 10 MG (21) TBPK tablet Take by mouth daily for 6 days. Take 6 tablets on day 1, 5 tablets on day 2, 4 tablets on day 3, 3 tablets on day 4, 2 tablets on day 5, 1 tablet on day 6 04/12/21 04/18/21 Yes Moshe Cipro, NP  promethazine-dextromethorphan (PROMETHAZINE-DM) 6.25-15 MG/5ML syrup Take 5 mLs by mouth 4 (four) times daily as needed for cough. 04/12/21  Yes Moshe Cipro, NP  acetaminophen (TYLENOL) 325 MG tablet Take 650 mg by mouth every 6 (six) hours as needed.    [provider]  albuterol (VENTOLIN HFA) 108 (90 Base) MCG/ACT inhaler Inhale 1 puff into the lungs every 6 (six) hours as needed for wheezing or shortness of breath. 10/31/20   Lamptey, Britta Mccreedy, MD  benzonatate (TESSALON) 100 MG capsule Take 1 capsule (100 mg total) by mouth 3 (three) times  daily as needed for cough. 10/31/20   Lamptey, Britta MccreedyPhilip O, MD  HYDROcodone-acetaminophen (NORCO/VICODIN) 5-325 MG tablet Take 1 tablet by mouth every 4 (four) hours as needed for severe pain. 12/20/20   Eber HongMiller, Brian, MD  HYDROcodone-acetaminophen (NORCO/VICODIN) 5-325 MG tablet Take 1 tablet by mouth every 6 (six) hours as needed. 12/20/20   Eber HongMiller, Brian, MD  ibuprofen (ADVIL) 600 MG tablet Take 1 tablet (600 mg total) by mouth every 6 (six) hours as needed. 10/31/20   Merrilee JanskyLamptey, Philip O, MD  misoprostol (CYTOTEC) 200 MCG tablet Take 2 tablets by mouth 5-6 hours before your next clinic appointment 05/18/20   Cresenzo-Dishmon, Scarlette CalicoFrances, CNM  naproxen (NAPROSYN) 500 MG tablet Take 1 tablet (500 mg total) by mouth 2 (two) times daily with a meal. 12/20/20   Eber HongMiller, Brian, MD  ondansetron (ZOFRAN ODT) 4 MG disintegrating tablet Take 1 tablet (4 mg  total) by mouth every 8 (eight) hours as needed for nausea. 12/20/20   Eber HongMiller, Brian, MD  ondansetron (ZOFRAN) 4 MG tablet Take 1 tablet (4 mg total) by mouth every 8 (eight) hours as needed for nausea or vomiting. 12/20/20   Eber HongMiller, Brian, MD  triamcinolone (NASACORT ALLERGY 24HR) 55 MCG/ACT AERO nasal inhaler Place 2 sprays into the nose daily.    [provider]  valACYclovir (VALTREX) 500 MG tablet Take 1 tablet (500 mg total) by mouth daily. 05/18/20   Cresenzo-Dishmon, Scarlette CalicoFrances, CNM  dicyclomine (BENTYL) 20 MG tablet Take 1 tablet (20 mg total) by mouth every 6 (six) hours as needed (abdominal cramping). 01/23/12 02/09/12  Samuel JesterMcManus, Kathleen, DO    Family History Family History  Problem Relation Age of Onset  . Diabetes Paternal Grandfather   . Heart attack Paternal Grandfather   . Other Paternal Grandfather        aneursym  . Diabetes Paternal Grandmother   . Cancer Paternal Grandmother        breast,lung  . Other Maternal Grandmother        heart issues; has stent; has a eating disorder  . COPD Father   . Other Father        heart issues  . COPD Mother   . Mental illness Sister   . Autism Other     Social History Social History   Tobacco Use  . Smoking status: Never Smoker  . Smokeless tobacco: Never Used  Vaping Use  . Vaping Use: Never used  Substance Use Topics  . Alcohol use: Yes    Alcohol/week: 2.0 standard drinks    Types: 2 Cans of beer per week    Comment: occ  . Drug use: No     Allergies   Amoxicillin, Avelox [moxifloxacin hcl in nacl], Cefdinir, Doxycycline, and Latex   Review of Systems Review of Systems  Respiratory: Positive for cough.      Physical Exam Triage Vital Signs ED Triage Vitals  Enc Vitals Group     BP 04/12/21 1627 (!) 141/84     Pulse Rate 04/12/21 1627 78     Resp 04/12/21 1627 18     Temp 04/12/21 1627 97.7 F (36.5 C)     Temp Source 04/12/21 1627 Tympanic     SpO2 04/12/21 1627 97 %     Weight --      Height  --      Head Circumference --      Peak Flow --      Pain Score 04/12/21 1632 4     Pain Loc --  Pain Edu? --      Excl. in GC? --    No data found.  Updated Vital Signs BP (!) 141/84 (BP Location: Right Arm)   Pulse 78   Temp 97.7 F (36.5 C) (Tympanic)   Resp 18   LMP 03/22/2021   SpO2 97%   Visual Acuity Right Eye Distance:   Left Eye Distance:   Bilateral Distance:    Right Eye Near:   Left Eye Near:    Bilateral Near:     Physical Exam Vitals and nursing note reviewed.  Constitutional:      General: She is not in acute distress.    Appearance: She is well-developed. She is ill-appearing.  HENT:     Head: Normocephalic and atraumatic.     Right Ear: Tympanic membrane, ear canal and external ear normal.     Left Ear: Tympanic membrane, ear canal and external ear normal.     Nose: Congestion present.     Comments: Maxillary and frontal sinus tenderness    Mouth/Throat:     Mouth: Mucous membranes are moist.     Pharynx: Posterior oropharyngeal erythema present.     Comments: Cobblestoning present  Eyes:     Extraocular Movements: Extraocular movements intact.     Conjunctiva/sclera: Conjunctivae normal.     Pupils: Pupils are equal, round, and reactive to light.  Cardiovascular:     Rate and Rhythm: Normal rate and regular rhythm.     Heart sounds: Normal heart sounds. No murmur heard.   Pulmonary:     Effort: Pulmonary effort is normal. No respiratory distress.     Breath sounds: No stridor. Wheezing present. No rhonchi or rales.  Chest:     Chest wall: No tenderness.  Musculoskeletal:        General: Normal range of motion.     Cervical back: Normal range of motion and neck supple.  Lymphadenopathy:     Cervical: Cervical adenopathy present.  Skin:    General: Skin is warm and dry.     Capillary Refill: Capillary refill takes less than 2 seconds.  Neurological:     General: No focal deficit present.     Mental Status: She is alert and  oriented to person, place, and time.  Psychiatric:        Mood and Affect: Mood normal.        Behavior: Behavior normal.        Thought Content: Thought content normal.      UC Treatments / Results  Labs (all labs ordered are listed, but only abnormal results are displayed) Labs Reviewed  NOVEL CORONAVIRUS, NAA   Narrative:    Performed at:  [address], [place], [place]  [id number] Lab Director: Jolene Schimke MD, Phone:  [phone]  SARS-COV-2, NAA 2 DAY TAT   Narrative:    Performed at:  [address] [place] [address] [place], [place]  [id number] Lab Director: Jolene Schimke MD, Phone:  [phone]    EKG   Radiology No results found.  Procedures Procedures (including critical care time)  Medications Ordered in UC Medications - No data to display  Initial Impression / Assessment and Plan / UC Course  I have reviewed the triage vital signs and the nursing notes.  Pertinent labs & imaging results that were available during my care of the patient were reviewed by me and considered in my medical decision making (see chart for details).    URI with  cough and congestion Acute Bronchitis  Prescribed Levaquin 500mg  once daily x 7 days Prescribed steroid taper Prescribed promethazine cough syrup Sedation precautions given Work note provided Covid swab obtained in office today.   Patient instructed to quarantine until results are back and negative.   If results are negative, patient may resume daily schedule as tolerated once they are fever free for 24 hours without the use of antipyretic medications.   If results are positive, patient instructed to quarantine for at least 5 days from symptom onset.  If after 5 days symptoms have resolved, may return to work with a well fitting mask for the next 5 days. If symptomatic after day 5, isolation should be extended to 10 days. Patient instructed to follow-up with primary care or with this  office as needed.   Patient instructed to follow-up in the ER for trouble swallowing, trouble breathing, other concerning symptoms.   Final Clinical Impressions(s) / UC Diagnoses   Final diagnoses:  URI with cough and congestion  Acute bronchitis, unspecified organism     Discharge Instructions     I have sent in Levaquin for you to take once a day for 7 days  I have sent in a prednisone taper for you to take for 6 days. 6 tablets on day one, 5 tablets on day two, 4 tablets on day three, 3 tablets on day four, 2 tablets on day five, and 1 tablet on day six.  Follow up with this office or with primary care if symptoms are persisting.  Follow up in the ER for high fever, trouble swallowing, trouble breathing, other concerning symptoms.     ED Prescriptions    Medication Sig Dispense Auth. Provider   levofloxacin (LEVAQUIN) 500 MG tablet Take 1 tablet (500 mg total) by mouth daily. 7 tablet , NP   predniSONE (STERAPRED UNI-PAK 21 TAB) 10 MG (21) TBPK tablet Take by mouth daily for 6 days. Take 6 tablets on day 1, 5 tablets on day 2, 4 tablets on day 3, 3 tablets on day 4, 2 tablets on day 5, 1 tablet on day 6 21 tablet Moshe Cipro, NP   promethazine-dextromethorphan (PROMETHAZINE-DM) 6.25-15 MG/5ML syrup Take 5 mLs by mouth 4 (four) times daily as needed for cough. 118 mL 01-23-1991, NP     PDMP not reviewed this encounter.   Moshe Cipro, NP 04/16/21 1920

## 2021-04-12 NOTE — Discharge Instructions (Addendum)
I have sent in Levaquin for you to take once a day for 7 days  I have sent in a prednisone taper for you to take for 6 days. 6 tablets on day one, 5 tablets on day two, 4 tablets on day three, 3 tablets on day four, 2 tablets on day five, and 1 tablet on day six.  Follow up with this office or with primary care if symptoms are persisting.  Follow up in the ER for high fever, trouble swallowing, trouble breathing, other concerning symptoms.

## 2021-04-12 NOTE — ED Triage Notes (Signed)
Pt said she has been having congestion and sore throat with nasal pressure and ear pressure ibn both. Pt said has been going on x 2 weeks. No fevers, some body aches.

## 2021-04-13 LAB — NOVEL CORONAVIRUS, NAA: SARS-CoV-2, NAA: NOT DETECTED

## 2021-04-13 LAB — SARS-COV-2, NAA 2 DAY TAT

## 2021-05-11 ENCOUNTER — Other Ambulatory Visit: Payer: Self-pay

## 2021-05-11 ENCOUNTER — Emergency Department (HOSPITAL_COMMUNITY): Payer: Medicaid Other

## 2021-05-11 ENCOUNTER — Emergency Department (HOSPITAL_COMMUNITY)
Admission: EM | Admit: 2021-05-11 | Discharge: 2021-05-11 | Disposition: A | Discharge status: Home / Self Care | Payer: Medicaid Other | Attending: Emergency Medicine | Admitting: Emergency Medicine

## 2021-05-11 ENCOUNTER — Encounter (HOSPITAL_COMMUNITY): Payer: Self-pay | Admitting: *Deleted

## 2021-05-11 DIAGNOSIS — R059 Cough, unspecified: Secondary | ICD-10-CM | POA: Insufficient documentation

## 2021-05-11 DIAGNOSIS — J3489 Other specified disorders of nose and nasal sinuses: Secondary | ICD-10-CM | POA: Diagnosis not present

## 2021-05-11 DIAGNOSIS — Z91040 Latex allergy status: Secondary | ICD-10-CM | POA: Insufficient documentation

## 2021-05-11 DIAGNOSIS — J45909 Unspecified asthma, uncomplicated: Secondary | ICD-10-CM | POA: Diagnosis not present

## 2021-05-11 DIAGNOSIS — R079 Chest pain, unspecified: Secondary | ICD-10-CM | POA: Diagnosis not present

## 2021-05-11 DIAGNOSIS — Z7951 Long term (current) use of inhaled steroids: Secondary | ICD-10-CM | POA: Insufficient documentation

## 2021-05-11 DIAGNOSIS — R0789 Other chest pain: Secondary | ICD-10-CM | POA: Insufficient documentation

## 2021-05-11 LAB — CBC WITH DIFFERENTIAL/PLATELET
Abs Immature Granulocytes: 0.02 10*3/uL (ref 0.00–0.07)
Basophils Absolute: 0 10*3/uL (ref 0.0–0.1)
Basophils Relative: 0 %
Eosinophils Absolute: 0.3 10*3/uL (ref 0.0–0.5)
Eosinophils Relative: 5 %
HCT: 41.2 % (ref 36.0–46.0)
Hemoglobin: 14 g/dL (ref 12.0–15.0)
Immature Granulocytes: 0 %
Lymphocytes Relative: 28 %
Lymphs Abs: 1.6 10*3/uL (ref 0.7–4.0)
MCH: 29.9 pg (ref 26.0–34.0)
MCHC: 34 g/dL (ref 30.0–36.0)
MCV: 88 fL (ref 80.0–100.0)
Monocytes Absolute: 0.4 10*3/uL (ref 0.1–1.0)
Monocytes Relative: 7 %
Neutro Abs: 3.4 10*3/uL (ref 1.7–7.7)
Neutrophils Relative %: 60 %
Platelets: 286 10*3/uL (ref 150–400)
RBC: 4.68 MIL/uL (ref 3.87–5.11)
RDW: 12.1 % (ref 11.5–15.5)
WBC: 5.7 10*3/uL (ref 4.0–10.5)
nRBC: 0 % (ref 0.0–0.2)

## 2021-05-11 LAB — BASIC METABOLIC PANEL
Anion gap: 6 (ref 5–15)
BUN: 6 mg/dL (ref 6–20)
CO2: 26 mmol/L (ref 22–32)
Calcium: 8.6 mg/dL — ABNORMAL LOW (ref 8.9–10.3)
Chloride: 106 mmol/L (ref 98–111)
Creatinine, Ser: 0.64 mg/dL (ref 0.44–1.00)
GFR, Estimated: >60 mL/min (ref >60)
Glucose, Bld: 111 mg/dL — ABNORMAL HIGH (ref 70–99)
Potassium: 3.6 mmol/L (ref 3.5–5.1)
Sodium: 138 mmol/L (ref 135–145)

## 2021-05-11 LAB — TROPONIN I (HIGH SENSITIVITY): Troponin I (High Sensitivity): <2 ng/L (ref <18)

## 2021-05-11 MED ORDER — FLUTICASONE PROPIONATE HFA 44 MCG/ACT IN AERO: 1.0000 | INHALATION_SPRAY | Freq: Two times a day (BID) | RESPIRATORY_TRACT | 0 refills | Status: DC

## 2021-05-11 MED ORDER — ALBUTEROL SULFATE (2.5 MG/3ML) 0.083% IN NEBU: 2.5000 mg | INHALATION_SOLUTION | Freq: Four times a day (QID) | RESPIRATORY_TRACT | 1 refills | Status: AC | PRN

## 2021-05-11 NOTE — ED Provider Notes (Signed)
Emergency Medicine Provider Triage Evaluation Note  Jasmine Maynard , a 30 y.o. female  was evaluated in triage.  Pt complains of persistent nonproductive cough and intermittent episodes of chest tightness without recognized wheezing x3 to 4 weeks.  Patient was seen at our urgent care center last month and diagnosed with bronchitis.  She has completed a course of Levaquin and steroids but her symptoms persist.  She denies wheezing, she has intermittent shortness of breath without trigger, can wake her at night.  Denies nausea or vomiting, no fevers.  She has had significant allerg symptoms with postnasal drip, awaiting allergist referral.  Denies fevers.  Review of Systems  Positive: Cough, chest tightness, postnasal drip Negative: Fever, wheezing, nausea or vomiting.  Physical Exam  BP 132/77 (BP Location: Right Arm)   Pulse 80   Temp 99.4 F (37.4 C) (Oral)   Resp 17   Ht 5\' 4"  (1.626 m)   Wt 104.3 kg   LMP 04/20/2021   SpO2 99%   BMI 39.48 kg/m  Gen:   Awake, no distress   Resp:  Normal effort , intermittent expiratory rhonchi at right upper lung field MSK:   Moves extremities without difficulty   Other:    Medical Decision Making  Medically screening exam initiated at 2:21 PM.  Appropriate orders placed.  Jasmine Maynard was informed that the remainder of the evaluation will be completed by another provider, this initial triage assessment does not replace that evaluation, and the importance of remaining in the ED until their evaluation is complete.  Chest x-ray has been ordered, medically screened.   Adriana Simas, PA-C 05/11/21 1424    05/13/21, MD 05/11/21 1428

## 2021-05-11 NOTE — Discharge Instructions (Addendum)
I suspect that your symptoms are related to your allergies and your chronic postnasal drip.  This may be complicated by your eye drainage and allergy eye symptoms as well.  I recommend adding an allergy eyedrop such as Zaditor, there is no prescription needed for this medication as it is an over-the-counter eyedrop.  You have also been prescribed an inhaled steroid to help you with your cough symptoms.  Keep your appointment with your new allergist as you are planning.  I do recommend continuing an allergy tablet such as your Zyrtec, using the Benadryl when you do not have to be concerned about drowsiness since this seems to work better for you.

## 2021-05-11 NOTE — ED Triage Notes (Signed)
Pt with chest pain across upper chest that radiates to mid back.  Pt with hx of acid reflux.  Has been taking her acid reflux medication (OTC).   Wheezing and coughing since May.

## 2021-05-11 NOTE — ED Provider Notes (Signed)
[facility] EMERGENCY DEPARTMENT Provider Note   CSN: [id number] Arrival date & time: 05/11/21  1334     History Chief Complaint  Patient presents with   Chest Pain    Jasmine Maynard is a 30 y.o. female ending for evaluation of persistent nonproductive cough along with intermittent episodes of chest tightness without recognized wheezing for the past 3 to 4 weeks.  She was seen at our urgent care center for this symptom last month and diagnosed with bronchitis, completed a course of Levaquin and prednisone without significant improvement in her symptoms.  She does endorse having significant history of sinus issues under the care of Dr. Suszanne Conners.  He has recommended she see an allergist and she is awaiting this appointment currently.  She describes chronic rhinorrhea, particularly postnasal drip and the suspicion is that her cough may possibly be secondary to this.  She also has acid reflux and is treated with a PPI and does not have symptoms suggesting breakthrough reflux.  She also endorses chronic right ear pressure which has also been evaluated by Dr. Suszanne Conners was not found the source according to the patient.  She is concerned about these intermittent episodes of chest pressure which can occur randomly.  She does report a strong family history of heart disease at an early age.  She denies nausea or vomiting, palpitations or diaphoresis with these episodes.  They have woke her at night.  She denies pain or swelling in her lower extremities.  She is not on OCPs.  The history is provided by the patient.      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   Chronic low back pain 09/17/2013   Depression with anxiety 02/11/2013   ADHD (attention deficit hyperactivity disorder) 02/11/2013    Past Surgical History:  Procedure Laterality Date   CHOLECYSTECTOMY  02/13/2012   Procedure: LAPAROSCOPIC CHOLECYSTECTOMY;  Surgeon: Fabio Bering, MD;  Location: [facility];  Service: General;  Laterality: N/A;   INTRAUTERINE DEVICE INSERTION  2011   MYRINGOTOMY  2011   right     OB History     Gravida  3   Para  3   Term  3   Preterm      AB      Living  3      SAB      IAB      Ectopic      Multiple  0   Live Births  3           Family History  Problem Relation Age of Onset   Diabetes Paternal Grandfather    Heart attack Paternal Grandfather    Other Paternal Grandfather        aneursym   Diabetes Paternal Grandmother    Cancer Paternal Grandmother        breast,lung  Other Maternal Grandmother        heart issues; has stent; has a eating disorder   COPD Father    Other Father        heart issues   COPD Mother    Mental illness Sister    Autism Other     Social History   Tobacco Use   Smoking status: Never   Smokeless tobacco: Never  Vaping Use   Vaping Use: Never used  Substance Use Topics   Alcohol use: Yes    Alcohol/week: 2.0 standard drinks    Types: 2 Cans of beer per week    Comment: occ   Drug use: No    Home Medications Prior to Admission medications   Medication Sig Start Date End Date Taking? Authorizing Provider  albuterol (PROVENTIL) (2.5 MG/3ML) 0.083% nebulizer solution Take 3 mLs (2.5 mg total) by nebulization every 6 (six) hours as needed for wheezing or shortness of breath. 05/11/21  Yes Hriday Stai, Raynelle Fanning, PA-C  fluticasone (FLOVENT HFA) 44 MCG/ACT inhaler Inhale 1 puff into the  lungs 2 (two) times daily. 05/11/21  Yes Catalyna Reilly, Raynelle Fanning, PA-C  acetaminophen (TYLENOL) 325 MG tablet Take 650 mg by mouth every 6 (six) hours as needed.    [provider]  benzonatate (TESSALON) 100 MG capsule Take 1 capsule (100 mg total) by mouth 3 (three) times daily as needed for cough. 10/31/20   Lamptey, Britta Mccreedy, MD  HYDROcodone-acetaminophen (NORCO/VICODIN) 5-325 MG tablet Take 1 tablet by mouth every 4 (four) hours as needed for severe pain. 12/20/20   Eber Hong, MD  HYDROcodone-acetaminophen (NORCO/VICODIN) 5-325 MG tablet Take 1 tablet by mouth every 6 (six) hours as needed. 12/20/20   Eber Hong, MD  ibuprofen (ADVIL) 600 MG tablet Take 1 tablet (600 mg total) by mouth every 6 (six) hours as needed. 10/31/20   Lamptey, Britta Mccreedy, MD  levofloxacin (LEVAQUIN) 500 MG tablet Take 1 tablet (500 mg total) by mouth daily. 04/12/21   Moshe Cipro, NP  misoprostol (CYTOTEC) 200 MCG tablet Take 2 tablets by mouth 5-6 hours before your next clinic appointment 05/18/20   Cresenzo-Dishmon, Scarlette Calico, CNM  naproxen (NAPROSYN) 500 MG tablet Take 1 tablet (500 mg total) by mouth 2 (two) times daily with a meal. 12/20/20   Eber Hong, MD  ondansetron (ZOFRAN ODT) 4 MG disintegrating tablet Take 1 tablet (4 mg total) by mouth every 8 (eight) hours as needed for nausea. 12/20/20   Eber Hong, MD  ondansetron (ZOFRAN) 4 MG tablet Take 1 tablet (4 mg total) by mouth every 8 (eight) hours as needed for nausea or vomiting. 12/20/20   Eber Hong, MD  promethazine-dextromethorphan (PROMETHAZINE-DM) 6.25-15 MG/5ML syrup Take 5 mLs by mouth 4 (four) times daily as needed for cough. 04/12/21   Moshe Cipro, NP  triamcinolone (NASACORT ALLERGY 24HR) 55 MCG/ACT AERO nasal inhaler Place 2 sprays into the nose daily.    [provider]  valACYclovir (VALTREX) 500 MG tablet Take 1 tablet (500 mg total) by mouth daily. 05/18/20   Cresenzo-Dishmon, Scarlette Calico, CNM  dicyclomine (BENTYL) 20 MG  tablet Take 1 tablet (20 mg total) by mouth every 6 (six) hours as needed (abdominal cramping). 01/23/12 02/09/12  Samuel Jester, DO    Allergies    Amoxicillin, Avelox [moxifloxacin hcl in nacl], Cefdinir, Doxycycline, and Latex  Review of Systems   Review of Systems  Constitutional:  Negative for chills, diaphoresis and fever.  HENT:  Positive for postnasal drip and rhinorrhea. Negative for congestion and  sore throat.   Eyes: Negative.   Respiratory:  Positive for cough, chest tightness and shortness of breath.   Cardiovascular:  Negative for palpitations and leg swelling.  Gastrointestinal:  Negative for abdominal pain, nausea and vomiting.  Genitourinary: Negative.   Musculoskeletal:  Negative for arthralgias, joint swelling and neck pain.  Skin: Negative.  Negative for rash and wound.  Neurological:  Negative for dizziness, weakness, light-headedness, numbness and headaches.  Psychiatric/Behavioral: Negative.     Physical Exam Updated Vital Signs BP 123/73   Pulse 69   Temp 98.8 F (37.1 C) (Oral)   Resp 17   Ht 5\' 4"  (1.626 m)   Wt 104.3 kg   LMP 04/20/2021   SpO2 96%   BMI 39.48 kg/m   Physical Exam Constitutional:      Appearance: She is well-developed.  HENT:     Head: Normocephalic and atraumatic.     Right Ear: Tympanic membrane and ear canal normal.     Nose: Rhinorrhea present. No mucosal edema.  Eyes:     Conjunctiva/sclera: Conjunctivae normal.  Cardiovascular:     Rate and Rhythm: Normal rate.     Heart sounds: Normal heart sounds.  Pulmonary:     Effort: Pulmonary effort is normal. No respiratory distress.     Breath sounds: Rhonchi present. No wheezing or rales.     Comments: Intermittent faint rhonchi left upper lung.  Abdominal:     Palpations: Abdomen is soft.     Tenderness: There is no abdominal tenderness.  Musculoskeletal:        General: Normal range of motion.  Skin:    General: Skin is warm and dry.     Findings: No rash.   Neurological:     Mental Status: She is alert and oriented to person, place, and time.    ED Results / Procedures / Treatments   Labs (all labs ordered are listed, but only abnormal results are displayed) Labs Reviewed  BASIC METABOLIC PANEL - Abnormal; Notable for the following components:      Result Value   Glucose, Bld 111 (*)    Calcium 8.6 (*)    All other components within normal limits  CBC WITH DIFFERENTIAL/PLATELET  TROPONIN I (HIGH SENSITIVITY)    EKG EKG Interpretation  Date/Time:  Tuesday May 11 2021 13:51:40 EDT Ventricular Rate:  86 PR Interval:  144 QRS Duration: 96 QT Interval:  378 QTC Calculation: 452 R Axis:   -5 Text Interpretation: Normal sinus rhythm with sinus arrhythmia Incomplete right bundle branch block Nonspecific T wave abnormality Abnormal ECG No old tracing to compare Confirmed by [id number] [phone]) on 05/11/2021 2:03:11 PM  Radiology DG Chest 2 View  Result Date: 05/11/2021 CLINICAL DATA:  Persistent cough, chest pain EXAM: CHEST - 2 VIEW COMPARISON:  None. FINDINGS: The heart size and mediastinal contours are within normal limits. Both lungs are clear. The visualized skeletal structures are unremarkable. IMPRESSION: No acute abnormality of the lungs. Electronically Signed   By: 05/13/2021 M.D.   On: 05/11/2021 14:53    Procedures Procedures   Medications Ordered in ED Medications - No data to display  ED Course  I have reviewed the triage vital signs and the nursing notes.  Pertinent labs & imaging results that were available during my care of the patient were reviewed by me and considered in my medical decision making (see chart for details).    MDM Rules/Calculators/A&P  Labs and imaging reviewed and discussed with patient.  Her symptoms sound like they are all upper respiratory which are affecting this chronic cough and chest discomfort.  Her chest x-ray is clear.  Upon prior conversation she does  endorse itchy watery eyes and it was recommended she try an OTC allergy eyedrop such as Zaditor.  This may help reduce postnasal drip additionally.  She has albuterol MDI for as needed use.  She has had a recent prednisone taper which did help her symptoms while she was on the medication.  Discussed repeat taper versus an inhaled steroid which would offer less potential side effects.  She was prescribed Flovent to take twice daily.  She is also using an antihistamine, currently Zyrtec but is found no antihistamine that is helpful for the postnasal drip other than Benadryl which she states she cannot take consistently because it makes her too sleepy.  Advised to just take at night or when she can afford being a little drowsy to give at least temporary relief from this postnasal drip which I think is going to be important for helping to control his cough.  Plan follow-up with her new allergist as planned. After dc, pt asked for a refill of her albuterol nebs - script sent to her pharmacy Final Clinical Impression(s) / ED Diagnoses Final diagnoses:  Cough  Atypical chest pain    Rx / DC Orders ED Discharge Orders          Ordered    fluticasone (FLOVENT HFA) 44 MCG/ACT inhaler  2 times daily        05/11/21 1702    albuterol (PROVENTIL) (2.5 MG/3ML) 0.083% nebulizer solution  Every 6 hours PRN        05/11/21 1713             Burgess Amordol, Severa Jeremiah, PA-C 05/11/21 2351    Blane OharaZavitz, Joshua, MD 05/12/21 0006

## 2021-05-12 ENCOUNTER — Telehealth: Payer: Self-pay | Admitting: *Deleted

## 2021-05-12 NOTE — Telephone Encounter (Signed)
Transition Care Management Unsuccessful Follow-up Telephone Call  Date of discharge and from where:  05/11/2021 - [facility]  Attempts:  1st Attempt  Reason for unsuccessful TCM follow-up call:  Left voice message

## 2021-05-13 NOTE — Telephone Encounter (Signed)
Transition Care Management Follow-up Telephone Call Date of discharge and from where: 05/11/2021 - [facility] How have you been since you were released from the hospital? "I am okay" Any questions or concerns? No  Items Reviewed: Did the pt receive and understand the discharge instructions provided? Yes  Medications obtained and verified? Yes  Other? No  Any new allergies since your discharge? No  Dietary orders reviewed? No Do you have support at home? Yes    Functional Questionnaire: (I = Independent and D = Dependent) ADLs: I  Bathing/Dressing- I  Meal Prep- I  Eating- I  Maintaining continence- I  Transferring/Ambulation- I  Managing Meds- I  Follow up appointments reviewed:  PCP Hospital f/u appt confirmed? No   Specialist Hospital f/u appt confirmed? No   Are transportation arrangements needed? No  If their condition worsens, is the pt aware to call PCP or go to the Emergency Dept.? Yes Was the patient provided with contact information for the PCP's office or ED? Yes Was to pt encouraged to call back with questions or concerns? Yes

## 2021-05-26 ENCOUNTER — Encounter: Payer: Self-pay | Admitting: Allergy & Immunology

## 2021-05-26 ENCOUNTER — Other Ambulatory Visit: Payer: Self-pay

## 2021-05-26 ENCOUNTER — Ambulatory Visit (INDEPENDENT_AMBULATORY_CARE_PROVIDER_SITE_OTHER): Payer: Medicaid Other | Admitting: Allergy & Immunology

## 2021-05-26 VITALS — BP 120/86 | HR 80 | Temp 98.7°F | Resp 16 | Ht 64.0 in | Wt 242.8 lb

## 2021-05-26 DIAGNOSIS — J302 Other seasonal allergic rhinitis: Secondary | ICD-10-CM

## 2021-05-26 DIAGNOSIS — J3089 Other allergic rhinitis: Secondary | ICD-10-CM | POA: Diagnosis not present

## 2021-05-26 DIAGNOSIS — J4520 Mild intermittent asthma, uncomplicated: Secondary | ICD-10-CM

## 2021-05-26 DIAGNOSIS — L299 Pruritus, unspecified: Secondary | ICD-10-CM

## 2021-05-26 NOTE — Patient Instructions (Addendum)
1. Mild intermittent asthma, uncomplicated - Lung testing looks fairly good today. - We are not going to diagnose you officially with asthma at this point in time, but we could consider it at the next visit. - Continue with albuterol 2 to 4 puffs as needed.  2. Chronic rhinitis - Testing today showed: grasses, indoor molds, outdoor molds, and cat - Copy of test results provided.  - Avoidance measures provided. - Continue with: Xyzal (levocetirizine) 5mg  tablet once daily and Flonase (fluticasone) two sprays per nostril daily (CAN INCREASE TO TWICE DAILY) - Start taking: Singulair (montelukast) 10mg  daily and Astelin (azelastine) 2 sprays per nostril 2 times daily  - Singulair can cause irritability and depression, so beware of that (most tolerate it just fine). - We are going to work hard on this postnasal drip to see if all of this helps with the coughing.  - You can use an extra dose of the antihistamine, if needed, for breakthrough symptoms.  - Consider nasal saline rinses 1-2 times daily to remove allergens from the nasal cavities as well as help with mucous clearance (this is especially helpful to do before the nasal sprays are given) - Consider allergy shots as a means of long-term control. - Allergy shots "re-train" and "reset" the immune system to ignore environmental allergens and decrease the resulting immune response to those allergens (sneezing, itchy watery eyes, runny nose, nasal congestion, etc).    - Allergy shots improve symptoms in 75-85% of patients.  - We can discuss more at the next appointment if the medications are not working for you.  3. Pruritus - Hopefully all of these medications with help with the itching. - Testing was negative to all of the most common foods.   4. Return in about 6 weeks (around 07/07/2021).    Please inform 1997 of any Emergency Department visits, hospitalizations, or changes in symptoms. Call 07/09/2021 before going to the ED for breathing or allergy  symptoms since we might be able to fit you in for a sick visit. Feel free to contact us anytime with any questions, problems, or concerns.  It was a pleasure to meet you today!  Websites that have reliable patient information: 1. American Academy of Asthma, Allergy, and Immunology: www.aaaai.org 2. Food Allergy Research and Education (FARE): foodallergy.org 3. Mothers of Asthmatics: http://www.asthmacommunitynetwork.org 4. American College of Allergy, Asthma, and Immunology: www.acaai.org   COVID-19 Vaccine Information can be found at: [place] For questions related to vaccine distribution or appointments, please email [email or web address] or call [phone].   We realize that you might be concerned about having an allergic reaction to the COVID19 vaccines. To help with that concern, WE ARE OFFERING THE COVID19 VACCINES IN OUR OFFICE! Ask the front desk for dates!     "Like" [email or web address] on Facebook and Instagram for our latest updates!      A healthy democracy works best when [phone] participate! Make sure you are registered to vote! If you have moved or changed any of your contact information, you will need to get this updated before voting!  In some cases, you MAY be able to register to vote online: [place]    1. Control-Buffer 50% Glycerol Negative   2. Control-Histamine 1 mg/ml 2+   3. Albumin saline Negative   4. Bahia Negative   5. Applied Materials Negative   6. Johnson Negative   7. Kentucky Blue Negative   8. Meadow Fescue Negative   9. Perennial Rye Negative   10. Sweet Vernal Negative  11. Timothy Negative   12. Cocklebur Negative   13. Burweed Marshelder Negative   14. Ragweed, short Negative   15. Ragweed, Giant Negative   16. Plantain,  English Negative   17. Lamb's Quarters Negative   18. Sheep Sorrell Negative   19. Rough Pigweed Negative   20. Marsh Elder,  Rough Negative   21. Mugwort, Common Negative   22. Ash mix Negative   23. Birch mix Negative   24. Beech American Negative   25. Box, Elder Negative   26. Cedar, red Negative   27. Cottonwood, Guinea[place] Negative   28. Elm mix Negative   29. Hickory Negative   30. Maple mix Negative   31. Oak, [place] mix Negative   32. Pecan Pollen Negative   33. Pine mix Negative   34. Sycamore Eastern Negative   35. Walnut, Black Pollen Negative   36. Alternaria alternata Negative   37. Cladosporium Herbarum Negative   38. Aspergillus mix Negative   39. Penicillium mix Negative   40. Bipolaris sorokiniana (Helminthosporium) Negative   41. Drechslera spicifera (Curvularia) Negative   42. Mucor plumbeus Negative   43. Fusarium moniliforme Negative   44. Aureobasidium pullulans (pullulara) Negative   45. Rhizopus oryzae Negative   46. Botrytis cinera Negative   47. Epicoccum nigrum Negative   48. Phoma betae Negative   49. Candida Albicans Negative   50. Trichophyton mentagrophytes Negative   51. Mite, D Farinae  5,000 AU/ml Negative   52. Mite, D Pteronyssinus  5,000 AU/ml Negative   53. Cat Hair 10,000 BAU/ml Negative   54.  Dog Epithelia Negative   55. Mixed Feathers Negative   56. Horse Epithelia Negative   57. Cockroach, German Negative   58. Mouse Negative   59. Tobacco Leaf Negative     1. Peanut Negative   2. Soybean food Negative   3. Wheat, whole Negative   4. Sesame Negative   5. Milk, cow Negative   6. Egg White, chicken Negative   7. Casein Negative   8. Shellfish mix Negative   9. Fish mix Negative   10. Cashew Negative     Control Negative   [place] Negative   Johnson Negative   7 Grass 1+   Ragweed mix Negative   Weed mix Negative   Tree mix Negative   Mold 1 1+   Mold 2 4+   Mold 3 3+   Mold 4 4+   Cat 4+   Dog Negative   Cockroach Negative   Mite mix Negative    Reducing Pollen Exposure  The American Academy of Allergy, Asthma and Immunology  suggests the following steps to reduce your exposure to pollen during allergy seasons.    Do not hang sheets or clothing out to dry; pollen may collect on these items. Do not mow lawns or spend time around freshly cut grass; mowing stirs up pollen. Keep windows closed at night.  Keep car windows closed while driving. Minimize morning activities outdoors, a time when pollen counts are usually at their highest. Stay indoors as much as possible when pollen counts or humidity is high and on windy days when pollen tends to remain in the air longer. Use air conditioning when possible.  Many air conditioners have filters that trap the pollen spores. Use a HEPA room air filter to remove pollen form the indoor air you breathe.  Control of Mold Allergen   Mold and fungi can grow on a variety of surfaces provided  certain temperature and moisture conditions exist.  Outdoor molds grow on plants, decaying vegetation and soil.  The major outdoor mold, Alternaria and Cladosporium, are found in very high numbers during hot and dry conditions.  Generally, a late Summer - Fall peak is seen for common outdoor fungal spores.  Rain will temporarily lower outdoor mold spore count, but counts rise rapidly when the rainy period ends.  The most important indoor molds are Aspergillus and Penicillium.  Dark, humid and poorly ventilated basements are ideal sites for mold growth.  The next most common sites of mold growth are the bathroom and the kitchen.  Outdoor (Seasonal) Mold Control  Positive outdoor molds via skin testing: Alternaria, Cladosporium, Bipolaris (Helminthsporium), Drechslera (Curvalaria), and Mucor  Use air conditioning and keep windows closed Avoid exposure to decaying vegetation. Avoid leaf raking. Avoid grain handling. Consider wearing a face mask if working in moldy areas.    Indoor (Perennial) Mold Control   Positive indoor molds via skin testing: Aspergillus, Penicillium, Fusarium,  Aureobasidium (Pullulara), and Rhizopus  Maintain humidity below 50%. Clean washable surfaces with 5% bleach solution. Remove sources e.g. contaminated carpets.    Control of Dog or Cat Allergen  Avoidance is the best way to manage a dog or cat allergy. If you have a dog or cat and are allergic to dog or cats, consider removing the dog or cat from the home. If you have a dog or cat but don't want to find it a new home, or if your family wants a pet even though someone in the household is allergic, here are some strategies that may help keep symptoms at bay:  Keep the pet out of your bedroom and restrict it to only a few rooms. Be advised that keeping the dog or cat in only one room will not limit the allergens to that room. Don't pet, hug or kiss the dog or cat; if you do, wash your hands with soap and water. High-efficiency particulate air (HEPA) cleaners run continuously in a bedroom or living room can reduce allergen levels over time. Regular use of a high-efficiency vacuum cleaner or a central vacuum can reduce allergen levels. Giving your dog or cat a bath at least once a week can reduce airborne allergen.  Allergy Shots   Allergies are the result of a chain reaction that starts in the immune system. Your immune system controls how your body defends itself. For instance, if you have an allergy to pollen, your immune system identifies pollen as an invader or allergen. Your immune system overreacts by producing antibodies called Immunoglobulin E (IgE). These antibodies travel to cells that release chemicals, causing an allergic reaction.  The concept behind allergy immunotherapy, whether it is received in the form of shots or tablets, is that the immune system can be desensitized to specific allergens that trigger allergy symptoms. Although it requires time and patience, the payback can be long-term relief.  How Do Allergy Shots Work?  Allergy shots work much like a vaccine. Your body  responds to injected amounts of a particular allergen given in increasing doses, eventually developing a resistance and tolerance to it. Allergy shots can lead to decreased, minimal or no allergy symptoms.  There generally are two phases: build-up and maintenance. Build-up often ranges from three to six months and involves receiving injections with increasing amounts of the allergens. The shots are typically given once or twice a week, though more rapid build-up schedules are sometimes used.  The maintenance phase begins when  the most effective dose is reached. This dose is different for each person, depending on how allergic you are and your response to the build-up injections. Once the maintenance dose is reached, there are longer periods between injections, typically two to four weeks.  Occasionally doctors give cortisone-type shots that can temporarily reduce allergy symptoms. These types of shots are different and should not be confused with allergy immunotherapy shots.  Who Can Be Treated with Allergy Shots?  Allergy shots may be a good treatment approach for people with allergic rhinitis (hay fever), allergic asthma, conjunctivitis (eye allergy) or stinging insect allergy.   Before deciding to begin allergy shots, you should consider:   The length of allergy season and the severity of your symptoms  Whether medications and/or changes to your environment can control your symptoms  Your desire to avoid long-term medication use  Time: allergy immunotherapy requires a major time commitment  Cost: may vary depending on your insurance coverage  Allergy shots for children age 95five and older are effective and often well tolerated. They might prevent the onset of new allergen sensitivities or the progression to asthma.  Allergy shots are not started on patients who are pregnant but can be continued on patients who become pregnant while receiving them. In some patients with other medical  conditions or who take certain common medications, allergy shots may be of risk. It is important to mention other medications you talk to your allergist.   When Will I Feel Better?  Some may experience decreased allergy symptoms during the build-up phase. For others, it may take as long as 12 months on the maintenance dose. If there is no improvement after a year of maintenance, your allergist will discuss other treatment options with you.  If you aren't responding to allergy shots, it may be because there is not enough dose of the allergen in your vaccine or there are missing allergens that were not identified during your allergy testing. Other reasons could be that there are high levels of the allergen in your environment or major exposure to non-allergic triggers like tobacco smoke.  What Is the Length of Treatment?  Once the maintenance dose is reached, allergy shots are generally continued for three to five years. The decision to stop should be discussed with your allergist at that time. Some people may experience a permanent reduction of allergy symptoms. Others may relapse and a longer course of allergy shots can be considered.  What Are the Possible Reactions?  The two types of adverse reactions that can occur with allergy shots are local and systemic. Common local reactions include very mild redness and swelling at the injection site, which can happen immediately or several hours after. A systemic reaction, which is less common, affects the entire body or a particular body system. They are usually mild and typically respond quickly to medications. Signs include increased allergy symptoms such as sneezing, a stuffy nose or hives.  Rarely, a serious systemic reaction called anaphylaxis can develop. Symptoms include swelling in the throat, wheezing, a feeling of tightness in the chest, nausea or dizziness. Most serious systemic reactions develop within 30 minutes of allergy shots. This is why  it is strongly recommended you wait in your doctor's office for 30 minutes after your injections. Your allergist is trained to watch for reactions, and his or her staff is trained and equipped with the proper medications to identify and treat them.  Who Should Administer Allergy Shots?  The preferred location for receiving shots  is your prescribing allergist's office. Injections can sometimes be given at another facility where the physician and staff are trained to recognize and treat reactions, and have received instructions by your prescribing allergist.

## 2021-05-26 NOTE — Progress Notes (Signed)
NEW PATIENT  Date of Service/Encounter:  05/28/21  Consult requested by: Pcp, No   Assessment:   Mild intermittent asthma, uncomplicated  Seasonal and perennial allergic rhinitis (grasses, indoor molds, outdoor molds, and cat)  Pruritus  Plan/Recommendations:    1. Mild intermittent asthma, uncomplicated - Lung testing looks fairly good today. - We are not going to diagnose you officially with asthma at this point in time, but we could consider it at the next visit. - Continue with albuterol 2 to 4 puffs as needed.  2. Chronic rhinitis - Testing today showed: grasses, indoor molds, outdoor molds, and cat - Copy of test results provided.  - Avoidance measures provided. - Continue with: Xyzal (levocetirizine)  tablet once daily and Flonase (fluticasone) two sprays per nostril daily (CAN INCREASE TO TWICE DAILY) - Start taking: Singulair (montelukast)  daily and Astelin (azelastine) 2 sprays per nostril 2 times daily  - Singulair can cause irritability and depression, so beware of that (most tolerate it just fine). - We are going to work hard on this postnasal drip to see if all of this helps with the coughing.  - You can use an extra dose of the antihistamine, if needed, for breakthrough symptoms.  - Consider nasal saline rinses 1-2 times daily to remove allergens from the nasal cavities as well as help with mucous clearance (this is especially helpful to do before the nasal sprays are given) - Consider allergy shots as a means of long-term control. - Allergy shots "re-train" and "reset" the immune system to ignore environmental allergens and decrease the resulting immune response to those allergens (sneezing, itchy watery eyes, runny nose, nasal congestion, etc).    - Allergy shots improve symptoms in 75-85% of patients.  - We can discuss more at the next appointment if the medications are not working for you.  3. Pruritus - Hopefully all of these medications with  help with the itching. - Testing was negative to all of the most common foods.   4. Return in about 6 weeks (around 07/07/2021).    This note in its entirety was forwarded to the Provider who requested this consultation.  Subjective:   Jasmine Maynard is a 30 y.o. female presenting today for evaluation of  Chief Complaint  Patient presents with   Asthma    Not sure if she has asthma. Has flares from constant coughing. The only time she has SOB   Allergic Rhinitis     Itchy watery eyes, itchy body sometimes rashes, runny nose, and congestion    Cough    Constant cough due to allergies    Other    Had an ear tube in her right ear in her 20s to help with symptoms. She still has difficulty hearing out of right ear.     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  Jasmine Maynard was referred by Pcp, No.  Jasmine Maynard is a 30 y.o. female presenting for an evaluation of possible environmental allergies . She has had issue for atopic disease for her entire life. She was not insured for a long period of time which has hampered her workup.    Asthma/Respiratory Symptom History: She was never diagnosed with asthma when she was little.  Her son has asthma. Whenever she get sick, she has excessive coughing fits that feel like "spasms". She has a hard time stopping when it  starts.  She was sick in May 2022 with "sinus issues" and was given Levaquin.   Allergic Rhinitis Symptom History: She follows with Dr. Suszanne Conners, last seen around one year ago. She had tubes late in life and had some pressure on the right side. She was told that she needed allergy testing in the past. She had tubes placed around age 9 or 60, but only in the right ear. The left ear has mostly been good, but she "kept" ear infections when she was young. She has consistently had issues on the right side but the tubes helped a lot.  She uses Xyal 5mg  daily. She has used Nasacort and Flonase, both consistently without complete relief of her symptoms.  She has a history of nausea and has been on promethazine in the past. This does help but she does not take it daily. She has been on omeprazole since one of her urgent care visits.  This is helped with some of her GI issues.  She does have rashes when she is outside. She has a lot of dogs, cats, rabbits, and geese.  These rashes are pruritic.  They never become contact dermatitis in nature, and there is never any blistering.  Otherwise, there is no history of other atopic diseases, including drug allergies, stinging insect allergies, eczema, urticaria, or contact dermatitis. There is no significant infectious history. Vaccinations are up to date.    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   GERD (gastroesophageal reflux disease) 02/09/2015   Constipation 02/09/2015   Heartburn 01/21/2015   Vertigo 01/21/2015   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   Chronic low back pain 09/17/2013   Depression with anxiety 02/11/2013   ADHD (attention deficit hyperactivity disorder) 02/11/2013    Medication  List:  Allergies as of 05/26/2021       Reactions   Amoxicillin    Reaction: INEFFECTIVE Has patient had a PCN reaction causing immediate rash, facial/tongue/throat swelling, SOB or lightheadedness with hypotension: No Has patient had a PCN reaction causing severe rash involving mucus membranes or skin necrosis: No Has patient had a PCN reaction that required hospitalization No Has patient had a PCN reaction occurring within the last 10 years: No If all of the above answers are "NO", then may proceed with Cephalosporin use.   Avelox [moxifloxacin Hcl In Nacl] Diarrhea, Nausea And Vomiting   Cefdinir    ineffective   Doxycycline Nausea And Vomiting   Latex Rash        Medication List        Accurate as of May 26, 2021 11:59 PM. If you have any questions, ask your nurse or doctor.          acetaminophen 325 MG tablet Commonly known as: TYLENOL Take 650 mg  by mouth every 6 (six) hours as needed.   albuterol (2.5 MG/3ML) 0.083% nebulizer solution Commonly known as: PROVENTIL Take 3 mLs (2.5 mg total) by nebulization every 6 (six) hours as needed for wheezing or shortness of breath.   azelastine 0.1 % nasal spray Commonly known as: ASTELIN Place 2 sprays into both nostrils 2 (two) times daily. Started by: Alfonse Spruce, MD   benzonatate 100 MG capsule Commonly known as: TESSALON Take 1 capsule (100 mg total) by mouth 3 (three) times daily as needed for cough.   fluticasone 44 MCG/ACT inhaler Commonly known as: Flovent HFA Inhale 1 puff into the lungs 2 (two) times daily.   HYDROcodone-acetaminophen 5-325 MG tablet Commonly known as: NORCO/VICODIN Take 1 tablet by mouth every 4 (four) hours as needed for severe pain.   HYDROcodone-acetaminophen 5-325 MG tablet Commonly known as: NORCO/VICODIN Take 1 tablet by mouth every 6 (six) hours as needed.   ibuprofen 600 MG tablet Commonly known as: ADVIL Take 1 tablet (600 mg total) by mouth every 6 (six) hours as  needed.   levofloxacin 500 MG tablet Commonly known as: LEVAQUIN Take 1 tablet (500 mg total) by mouth daily.   misoprostol 200 MCG tablet Commonly known as: CYTOTEC Take 2 tablets by mouth 5-6 hours before your next clinic appointment   montelukast 10 MG tablet Commonly known as: Singulair Take 1 tablet (10 mg total) by mouth at bedtime. Started by: Alfonse Spruce, MD   naproxen 500 MG tablet Commonly known as: Naprosyn Take 1 tablet (500 mg total) by mouth 2 (two) times daily with a meal.   ondansetron 4 MG disintegrating tablet Commonly known as: Zofran ODT Take 1 tablet (4 mg total) by mouth every 8 (eight) hours as needed for nausea.   ondansetron 4 MG tablet Commonly known as: ZOFRAN Take 1 tablet (4 mg total) by mouth every 8 (eight) hours as needed for nausea or vomiting.   promethazine-dextromethorphan 6.25-15 MG/5ML syrup Commonly known as: PROMETHAZINE-DM Take 5 mLs by mouth 4 (four) times daily as needed for cough.   triamcinolone 55 MCG/ACT Aero nasal inhaler Commonly known as: NASACORT Place 2 sprays into the nose daily.   valACYclovir 500 MG tablet Commonly known as: Valtrex Take 1 tablet (500 mg total) by mouth daily.        Birth History: non-contributory  Developmental History: non-contributory  Past Surgical History: Past Surgical History:  Procedure Laterality Date   CHOLECYSTECTOMY  02/13/2012   Procedure: LAPAROSCOPIC CHOLECYSTECTOMY;  Surgeon: Fabio Bering, MD;  Location: [facility];  Service: General;  Laterality: N/A;   INTRAUTERINE DEVICE INSERTION  2011   MYRINGOTOMY  2011   right     Family History: Family History  Problem Relation Age of Onset   Diabetes Paternal Grandfather    Heart attack Paternal Grandfather    Other Paternal Grandfather        aneursym   Diabetes Paternal Grandmother    Cancer Paternal Grandmother        breast,lung   Other Maternal Grandmother        heart issues; has stent; has a eating  disorder   COPD Father    Other Father        heart issues   COPD Mother    Mental illness Sister    Autism Other      Social History: Kameka lives at home with her husband and kids.  Her husband works as a Psychologist, occupational and she is a Administrator, sports  mom.  She is going to start dog breeding.  They live in a house that is 31 years old.  There is carpeting throughout the home.  They have electric heating and central cooling.  There are cats inside of the home and cats, dogs, bodies, chickens, and dog outside of the home.  She does not have a dust mite covers on her bedding or pillows.  She is not exposed to tobacco exposure.  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  She does have a small air purifier in a few of her rooms.  She does not live near an interstate or industrial area.   Review of Systems  Constitutional: Negative.  Negative for fever, malaise/fatigue and weight loss.  HENT:  Positive for congestion and sinus pain. Negative for ear discharge and ear pain.        Positive for postnasal drip.  Eyes:  Negative for pain, discharge and redness.  Respiratory:  Negative for cough, sputum production, shortness of breath and wheezing.   Cardiovascular: Negative.  Negative for chest pain and palpitations.  Gastrointestinal:  Negative for abdominal pain, heartburn, nausea and vomiting.  Skin: Negative.  Negative for itching and rash.  Neurological:  Negative for dizziness and headaches.  Endo/Heme/Allergies:  Negative for environmental allergies. Does not bruise/bleed easily.      Objective:   Blood pressure 120/86, pulse 80, temperature 98.7 F (37.1 C), resp. rate 16, height 5\' 4"  (1.626 m), weight 242 lb 12.8 oz (110.1 kg), SpO2 98 %. Body mass index is 41.68 kg/m.   Physical Exam:   Physical Exam Constitutional:      Appearance: She is well-developed.  HENT:     Head: Normocephalic and atraumatic.     Right Ear: Tympanic membrane, ear  canal and external ear normal. No drainage, swelling or tenderness. Tympanic membrane is not injected, scarred, erythematous, retracted or bulging.     Left Ear: Tympanic membrane, ear canal and external ear normal. No drainage, swelling or tenderness. Tympanic membrane is not injected, scarred, erythematous, retracted or bulging.     Nose: Mucosal edema and rhinorrhea present. No nasal deformity or septal deviation.     Right Turbinates: Enlarged and swollen.     Left Turbinates: Enlarged and swollen.     Right Sinus: No maxillary sinus tenderness or frontal sinus tenderness.     Left Sinus: No maxillary sinus tenderness or frontal sinus tenderness.     Mouth/Throat:     Mouth: Mucous membranes are not pale and not dry.     Pharynx: Uvula midline.  Eyes:     General: Lids are normal. Allergic shiner present.        Right eye: No discharge.        Left eye: No discharge.     Conjunctiva/sclera: Conjunctivae normal.     Right eye: Right conjunctiva is not injected. No chemosis.    Left eye: Left conjunctiva is not injected. No chemosis.    Pupils: Pupils are equal, round, and reactive to light.  Cardiovascular:     Rate and Rhythm: Normal rate and regular rhythm.     Heart sounds: Normal heart sounds.  Pulmonary:     Effort: Pulmonary effort is normal. No tachypnea, accessory muscle usage or respiratory distress.     Breath sounds: Normal breath sounds. No wheezing, rhonchi or rales.     Comments: Moving air well in all lung fields.  No increased  work of breathing. Chest:     Chest wall: No tenderness.  Abdominal:     Tenderness: There is no abdominal tenderness. There is no guarding or rebound.  Lymphadenopathy:     Head:     Right side of head: No submandibular, tonsillar or occipital adenopathy.     Left side of head: No submandibular, tonsillar or occipital adenopathy.     Cervical: No cervical adenopathy.  Skin:    General: Skin is warm.     Capillary Refill: Capillary refill  takes less than 2 seconds.     Coloration: Skin is not pale.     Findings: No abrasion, erythema, petechiae or rash. Rash is not papular, urticarial or vesicular.     Comments: Fair skin.  Neurological:     Mental Status: She is alert.  Psychiatric:        Behavior: Behavior is cooperative.     Diagnostic studies:   Spirometry: results normal (FEV1: 2.82/88%, FVC: 3.41/90%, FEV1/FVC: 83%). Spirometry consistent with normal pattern.   Allergy Studies:     Airborne Adult Perc - 05/26/21 1426     Time Antigen Placed 1426    Allergen Manufacturer Waynette Buttery    Location Back    Number of Test 59    Panel 1 Select    1. Control-Buffer 50% Glycerol Negative    2. Control-Histamine 1 mg/ml 2+    3. Albumin saline Negative    4. Bahia Negative    5. French Southern Territories Negative    6. Johnson Negative    7. Kentucky Blue Negative    8. Meadow Fescue Negative    9. Perennial Rye Negative    10. Sweet Vernal Negative    11. Timothy Negative    12. Cocklebur Negative    13. Burweed Marshelder Negative    14. Ragweed, short Negative    15. Ragweed, Giant Negative    16. Plantain,  English Negative    17. Lamb's Quarters Negative    18. Sheep Sorrell Negative    19. Rough Pigweed Negative    20. Marsh Elder, Rough Negative    21. Mugwort, Common Negative    22. Ash mix Negative    23. Birch mix Negative    24. Beech American Negative    25. Box, Elder Negative    26. Cedar, red Negative    27. Cottonwood, Guinea-Bissau Negative    28. Elm mix Negative    29. Hickory Negative    30. Maple mix Negative    31. Oak, Guinea-[place]issau mix Negative    32. Pecan Pollen Negative    33. Pine mix Negative    34. Sycamore Eastern Negative    35. Walnut, Black Pollen Negative    36. Alternaria alternata Negative    37. Cladosporium Herbarum Negative    38. Aspergillus mix Negative    39. Penicillium mix Negative    40. Bipolaris sorokiniana (Helminthosporium) Negative    41. Drechslera spicifera (Curvularia)  Negative    42. Mucor plumbeus Negative    43. Fusarium moniliforme Negative    44. Aureobasidium pullulans (pullulara) Negative    45. Rhizopus oryzae Negative    46. Botrytis cinera Negative    47. Epicoccum nigrum Negative    48. Phoma betae Negative    49. Candida Albicans Negative    50. Trichophyton mentagrophytes Negative    51. Mite, D Farinae  5,000 AU/ml Negative    52. Mite, D Pteronyssinus  5,000 AU/ml Negative  53. Cat Hair 10,000 BAU/ml Negative    54.  Dog Epithelia Negative    55. Mixed Feathers Negative    56. Horse Epithelia Negative    57. Cockroach, German Negative    58. Mouse Negative    59. Tobacco Leaf Negative             Food Perc - 05/26/21 1427       Test Information   Time Antigen Placed 1427    Allergen Manufacturer Waynette ButteryGreer    Location Back    Number of allergen test 10    Food Select      Food   1. Peanut Negative    2. Soybean food Negative    3. Wheat, whole Negative    4. Sesame Negative    5. Milk, cow Negative    6. Egg White, chicken Negative    7. Casein Negative    8. Shellfish mix Negative    9. Fish mix Negative    10. Cashew Negative             Intradermal - 05/26/21 1446     Time Antigen Placed 1500    Allergen Manufacturer Waynette ButteryGreer    Location Arm    Number of Test 15    Intradermal Select    Control Negative    French Southern TerritoriesBermuda Negative    Johnson Negative    7 Grass 1+    Ragweed mix Negative    Weed mix Negative    Tree mix Negative    Mold 1 1+    Mold 2 4+    Mold 3 3+    Mold 4 4+    Cat 4+    Dog Negative    Cockroach Negative    Mite mix Negative             Allergy testing results were read and interpreted by myself, documented by clinical staff.         Malachi BondsJoel Aanya Haynes, MD [facility]

## 2021-05-28 DIAGNOSIS — J4520 Mild intermittent asthma, uncomplicated: Secondary | ICD-10-CM | POA: Insufficient documentation

## 2021-05-28 DIAGNOSIS — L299 Pruritus, unspecified: Secondary | ICD-10-CM | POA: Insufficient documentation

## 2021-05-28 DIAGNOSIS — J3089 Other allergic rhinitis: Secondary | ICD-10-CM | POA: Insufficient documentation

## 2021-05-28 DIAGNOSIS — J302 Other seasonal allergic rhinitis: Secondary | ICD-10-CM | POA: Insufficient documentation

## 2021-05-28 MED ORDER — AZELASTINE HCL 0.1 % NA SOLN: 2.0000 | Freq: Two times a day (BID) | NASAL | 1 refills | Status: DC

## 2021-05-28 MED ORDER — MONTELUKAST SODIUM 10 MG PO TABS: 10.0000 mg | ORAL_TABLET | Freq: Every day | ORAL | 1 refills | Status: AC

## 2021-06-16 ENCOUNTER — Other Ambulatory Visit: Payer: Self-pay | Admitting: Advanced Practice Midwife

## 2021-07-16 ENCOUNTER — Ambulatory Visit: Payer: Medicaid Other | Admitting: Allergy & Immunology

## 2021-08-17 ENCOUNTER — Other Ambulatory Visit (HOSPITAL_COMMUNITY)
Admission: RE | Admit: 2021-08-17 | Discharge: 2021-08-17 | Disposition: A | Discharge status: Home / Self Care | Payer: Medicaid Other | Source: Ambulatory Visit | Attending: Obstetrics & Gynecology | Admitting: Obstetrics & Gynecology

## 2021-08-17 ENCOUNTER — Other Ambulatory Visit (INDEPENDENT_AMBULATORY_CARE_PROVIDER_SITE_OTHER): Payer: Medicaid Other

## 2021-08-17 ENCOUNTER — Other Ambulatory Visit: Payer: Self-pay

## 2021-08-17 DIAGNOSIS — N898 Other specified noninflammatory disorders of vagina: Secondary | ICD-10-CM | POA: Diagnosis not present

## 2021-08-17 NOTE — Progress Notes (Signed)
Chart reviewed for nurse visit. Agree with plan of care.  Adline Potter, NP 08/17/2021 5:45 PM

## 2021-08-17 NOTE — Progress Notes (Signed)
   NURSE VISIT- VAGINITIS/STD  SUBJECTIVE:  Jasmine Maynard is a 30 y.o. [id number] GYN patientfemale here for a vaginal swab for vaginitis screening, STD screen.  She reports the following symptoms: discharge described as white, curd-like, milky, and vulvar erythema noted, local irritation, and vulvar itching for 1.5 weeks. Denies abnormal vaginal bleeding, significant pelvic pain, fever, or UTI symptoms.  OBJECTIVE:  There were no vitals taken for this visit.  Appears well, in no apparent distress  ASSESSMENT: Vaginal swab for  vaginitis & STD screening  PLAN: Self-collected vaginal probe for Gonorrhea, Chlamydia, Trichomonas, Bacterial Vaginosis, Yeast sent to lab Treatment: to be determined once results are received Follow-up as needed if symptoms persist/worsen, or new symptoms develop  Lynda Capistran A Arrianna Catala  08/17/2021 4:44 PM

## 2021-08-19 LAB — CERVICOVAGINAL ANCILLARY ONLY
Bacterial Vaginitis (gardnerella): NEGATIVE
Candida Glabrata: NEGATIVE
Candida Vaginitis: NEGATIVE
Chlamydia: NEGATIVE
Comment: NEGATIVE
Comment: NEGATIVE
Comment: NEGATIVE
Comment: NEGATIVE
Comment: NEGATIVE
Comment: NORMAL
Neisseria Gonorrhea: NEGATIVE
Trichomonas: NEGATIVE

## 2021-09-14 ENCOUNTER — Other Ambulatory Visit: Payer: Medicaid Other | Admitting: Women's Health

## 2021-10-13 ENCOUNTER — Encounter: Payer: Self-pay | Admitting: Women's Health

## 2021-10-13 ENCOUNTER — Other Ambulatory Visit: Payer: Self-pay

## 2021-10-13 ENCOUNTER — Ambulatory Visit (INDEPENDENT_AMBULATORY_CARE_PROVIDER_SITE_OTHER): Payer: Medicaid Other | Admitting: Women's Health

## 2021-10-13 VITALS — BP 114/81 | HR 75 | Ht 65.0 in | Wt 244.0 lb

## 2021-10-13 DIAGNOSIS — N816 Rectocele: Secondary | ICD-10-CM

## 2021-10-13 DIAGNOSIS — F418 Other specified anxiety disorders: Secondary | ICD-10-CM

## 2021-10-13 DIAGNOSIS — B009 Herpesviral infection, unspecified: Secondary | ICD-10-CM

## 2021-10-13 DIAGNOSIS — Z01419 Encounter for gynecological examination (general) (routine) without abnormal findings: Secondary | ICD-10-CM | POA: Diagnosis not present

## 2021-10-13 MED ORDER — SERTRALINE HCL 25 MG PO TABS: 25.0000 mg | ORAL_TABLET | Freq: Every day | ORAL | 3 refills | Status: AC

## 2021-10-13 MED ORDER — VALACYCLOVIR HCL 1 G PO TABS: 1000.0000 mg | ORAL_TABLET | Freq: Every day | ORAL | 3 refills | Status: AC

## 2021-10-13 NOTE — Patient Instructions (Signed)
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
## 2021-11-10 ENCOUNTER — Telehealth: Payer: Medicaid Other | Admitting: Advanced Practice Midwife

## 2021-11-24 ENCOUNTER — Encounter: Payer: Self-pay | Admitting: Adult Health

## 2021-11-24 ENCOUNTER — Ambulatory Visit
Admission: EM | Admit: 2021-11-24 | Discharge: 2021-11-24 | Disposition: A | Discharge status: Home / Self Care | Payer: Medicaid Other

## 2021-11-24 ENCOUNTER — Telehealth (INDEPENDENT_AMBULATORY_CARE_PROVIDER_SITE_OTHER): Payer: Medicaid Other | Admitting: Adult Health

## 2021-11-24 ENCOUNTER — Other Ambulatory Visit: Payer: Self-pay

## 2021-11-24 DIAGNOSIS — J4521 Mild intermittent asthma with (acute) exacerbation: Secondary | ICD-10-CM

## 2021-11-24 DIAGNOSIS — F418 Other specified anxiety disorders: Secondary | ICD-10-CM | POA: Diagnosis not present

## 2021-11-24 DIAGNOSIS — J0100 Acute maxillary sinusitis, unspecified: Secondary | ICD-10-CM | POA: Diagnosis not present

## 2021-11-24 MED ORDER — DEXAMETHASONE SODIUM PHOSPHATE 10 MG/ML IJ SOLN
10.0000 mg | Freq: Once | INTRAMUSCULAR | Status: AC
  Administered 2021-11-24: 10 mg via INTRAMUSCULAR

## 2021-11-24 MED ORDER — DOXYCYCLINE HYCLATE 100 MG PO CAPS
100.0000 mg | ORAL_CAPSULE | Freq: Two times a day (BID) | ORAL | 0 refills | Status: DC
Start: 2021-11-24 — End: 2022-04-21

## 2021-11-24 NOTE — ED Provider Notes (Signed)
[facility]    CSN: [id number] Arrival date & time: 11/24/21  0809      History   Chief Complaint Chief Complaint  Patient presents with   Cough    Sinus drainage and cough    HPI Jasmine Maynard is a 31 y.o. female.   Presenting today with almost 2 weeks of initially what started out as a head cold with congestion, sore throat, cough and is now progressively worsening the past few days with sinus pain and pressure, thick mucus, worsening cough and chest tightness.  Has a history of asthma on albuterol as needed and significant seasonal allergies on Astelin, Zyrtec, Singulair and has been trying Mucinex and over-the-counter cold and congestion medications with no relief.  States she has a history of recurrent sinus infections.  Tested numerous times throughout illness for COVID and was always negative.  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   GERD (gastroesophageal reflux disease) 02/09/2015   Constipation 02/09/2015   Heartburn 01/21/2015   Vertigo 01/21/2015   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   Chronic low back pain 09/17/2013   Depression with anxiety 02/11/2013   ADHD (attention deficit hyperactivity disorder) 02/11/2013    Past Surgical History:  Procedure Laterality Date   CHOLECYSTECTOMY  02/13/2012   Procedure: LAPAROSCOPIC CHOLECYSTECTOMY;  Surgeon: Fabio BeringBrent C Ziegler, MD;  Location: [facility];  Service: General;  Laterality: N/A;    INTRAUTERINE DEVICE INSERTION  2011   MYRINGOTOMY  2011   right    OB History     Gravida  3   Para  3   Term  3   Preterm      AB      Living  3      SAB      IAB      Ectopic      Multiple  0   Live Births  3           Home Medications    Prior to Admission medications   Medication Sig Start Date End Date Taking? Authorizing Provider  cetirizine (ZYRTEC) 10 MG chewable tablet Chew 10 mg by mouth daily.   Yes [provider]  doxycycline (VIBRAMYCIN) 100 MG capsule Take 1 capsule (100 mg total) by mouth 2 (two) times daily. 11/24/21  Yes Particia NearingLane, Mariha Sleeper Elizabeth, PA-C  acetaminophen (TYLENOL) 325 MG tablet Take 650 mg by mouth every 6 (six) hours as needed.    [provider]  albuterol (PROVENTIL) (2.5 MG/3ML) 0.083% nebulizer solution Take 3 mLs (2.5 mg total) by nebulization every 6 (six) hours as needed for wheezing or shortness of breath. 05/11/21   Idol, Raynelle FanningJulie, PA-C  fluticasone (FLOVENT HFA) 44 MCG/ACT inhaler Inhale 1 puff into the lungs 2 (two) times daily. 05/11/21   Burgess AmorIdol, Julie, PA-C  ibuprofen (ADVIL) 600 MG tablet Take 1 tablet (600 mg total) by mouth every  6 (six) hours as needed. 10/31/20   Lamptey, Britta Mccreedy, MD  montelukast (SINGULAIR) 10 MG tablet Take 1 tablet (10 mg total) by mouth at bedtime. 05/28/21   Alfonse Spruce, MD  naproxen (NAPROSYN) 500 MG tablet Take 1 tablet (500 mg total) by mouth 2 (two) times daily with a meal. Patient not taking: Reported on 10/13/2021 12/20/20   Eber Hong, MD  sertraline (ZOLOFT) 25 MG tablet Take 1 tablet (25 mg total) by mouth daily. 10/13/21   Cheral Marker, CNM  valACYclovir (VALTREX) 1000 MG tablet Take 1 tablet (1,000 mg total) by mouth daily. 10/13/21   Cheral Marker, CNM  dicyclomine (BENTYL) 20 MG tablet Take 1 tablet (20 mg total) by mouth every 6 (six) hours as needed (abdominal cramping). 01/23/12 02/09/12  Samuel Jester, DO   Family History Family History  Problem  Relation Age of Onset   Diabetes Paternal Grandfather    Heart attack Paternal Grandfather    Other Paternal Grandfather        aneursym   Diabetes Paternal Grandmother    Cancer Paternal Grandmother        breast,lung   Other Maternal Grandmother        heart issues; has stent; has a eating disorder   COPD Father    Other Father        heart issues   COPD Mother    Mental illness Sister    Autism Other    Social History Social History   Tobacco Use   Smoking status: Never   Smokeless tobacco: Never  Vaping Use   Vaping Use: Never used  Substance Use Topics   Alcohol use: Yes    Alcohol/week: 2.0 standard drinks    Types: 2 Cans of beer per week    Comment: occ   Drug use: No     Allergies   Amoxicillin, Avelox [moxifloxacin hcl in nacl], Cefdinir, Doxycycline, and Latex   Review of Systems Review of Systems Per HPI  Physical Exam Triage Vital Signs ED Triage Vitals  Enc Vitals Group     BP 11/24/21 0828 136/89     Pulse Rate 11/24/21 0828 77     Resp 11/24/21 0828 16     Temp 11/24/21 0828 98.5 F (36.9 C)     Temp Source 11/24/21 0828 Oral     SpO2 11/24/21 0828 98 %     Weight --      Height --      Head Circumference --      Peak Flow --      Pain Score 11/24/21 0825 3     Pain Loc --      Pain Edu? --      Excl. in GC? --    No data found.  Updated Vital Signs BP 136/89 (BP Location: Right Arm)    Pulse 77    Temp 98.5 F (36.9 C) (Oral)    Resp 16    LMP 11/03/2021 (Approximate)    SpO2 98%   Visual Acuity Right Eye Distance:   Left Eye Distance:   Bilateral Distance:    Right Eye Near:   Left Eye Near:    Bilateral Near:     Physical Exam Vitals and nursing note reviewed.  Constitutional:      Appearance: Normal appearance.  HENT:     Head: Atraumatic.     Right Ear: Tympanic membrane and external ear normal.     Left Ear: Tympanic  membrane and external ear normal.     Nose: Congestion present.     Comments: Bilateral  maxillary sinuses tender to palpation    Mouth/Throat:     Mouth: Mucous membranes are moist.     Pharynx: Posterior oropharyngeal erythema present.  Eyes:     Extraocular Movements: Extraocular movements intact.     Conjunctiva/sclera: Conjunctivae normal.  Cardiovascular:     Rate and Rhythm: Normal rate and regular rhythm.     Heart sounds: Normal heart sounds.  Pulmonary:     Effort: Pulmonary effort is normal.     Breath sounds: Normal breath sounds. No wheezing or rales.  Musculoskeletal:        General: Normal range of motion.     Cervical back: Normal range of motion and neck supple.  Skin:    General: Skin is warm and dry.  Neurological:     Mental Status: She is alert and oriented to person, place, and time.     Motor: No weakness.     Gait: Gait normal.  Psychiatric:        Mood and Affect: Mood normal.        Thought Content: Thought content normal.    UC Treatments / Results  Labs (all labs ordered are listed, but only abnormal results are displayed) Labs Reviewed - No data to display  EKG  Radiology No results found.  Procedures Procedures (including critical care time)  Medications Ordered in UC Medications  dexamethasone (DECADRON) injection 10 mg (has no administration in time range)    Initial Impression / Assessment and Plan / UC Course  I have reviewed the triage vital signs and the nursing notes.  Pertinent labs & imaging results that were available during my care of the patient were reviewed by me and considered in my medical decision making (see chart for details).     Vital signs reassuring today, exam suggestive of a bacterial sinus infection following a viral upper respiratory infection.  She is also having an asthma exacerbation.  We will treat with IM Decadron, doxycycline, continued allergy and asthma regimen, sinus rinses and Mucinex.  Discussed return precautions for worsening symptoms.  Final Clinical Impressions(s) / UC Diagnoses    Final diagnoses:  Acute maxillary sinusitis, recurrence not specified  Mild intermittent asthma with acute exacerbation   Discharge Instructions   None    ED Prescriptions     Medication Sig Dispense Auth. Provider   doxycycline (VIBRAMYCIN) 100 MG capsule Take 1 capsule (100 mg total) by mouth 2 (two) times daily. 14 capsule Particia Nearing, [place]      PDMP not reviewed this encounter.   Particia Nearing, [place] 11/24/21 [phone]

## 2021-11-24 NOTE — ED Triage Notes (Signed)
Patient states she had a cold on Christmas.  Patient states she has thick green mucus coming from her nose and throat.  Patient states she is cough with a post nasal drip.   Patient states she has a inhaler, nasal spray and OTC cold meds  Denies Fever

## 2021-11-24 NOTE — Progress Notes (Signed)
Patient ID: Jasmine Maynard, female   DOB: October 11, 1991, 31 y.o.   MRN: [id number]   TELEHEALTH GYNECOLOGY VISIT ENCOUNTER NOTE  [facility]   Patient location: Home  I connected with Jasmine Maynard on 11/24/21 at  3:50 PM EST by telephone and verified that I am speaking with the correct person using two identifiers. Patient was unable to do MyChart audiovisual encounter due to technical difficulties, she tried several times.    I discussed the limitations, risks, security and privacy concerns of performing an evaluation and management service by telephone and the availability of in person appointments. I also discussed with the patient that there may be a patient responsible charge related to this service. The patient expressed understanding and agreed to proceed.   History:  Jasmine Maynard is a 31 y.o. [id number] female being evaluated today for anxiety and depression after starting 25 mg Zoloft in November and is much better. She denies any any SI or other concerns.       Past Medical History:  Diagnosis Date   ADHD (attention deficit hyperactivity disorder)    Asthma    DDD (degenerative disc disease)    in back   Depression    fine problem   Heartburn    Herpes    Perennial allergic rhinitis    Pregnant 01/21/2015   Pregnant 01/26/2016   Vertigo    Past Surgical History:  Procedure Laterality Date   CHOLECYSTECTOMY  02/13/2012   Procedure: LAPAROSCOPIC CHOLECYSTECTOMY;  Surgeon: Fabio Bering, MD;  Location: [facility];  Service: General;  Laterality: N/A;   INTRAUTERINE DEVICE INSERTION  2011   MYRINGOTOMY  2011   right   The following portions of the patient's history were reviewed and updated as appropriate: allergies, current medications, past family history, past medical history, past social history, past surgical history and problem list.   Health Maintenance:   Lab Results  Component Value Date   DIAGPAP  05/18/2020    - Negative  for intraepithelial lesion or malignancy (NILM)   HPVHIGH Negative 05/18/2020     Review of Systems:  Pertinent items noted in HPI and remainder of comprehensive ROS otherwise negative.  Physical Exam:   General:  Alert, oriented and cooperative.   Mental Status: Normal mood and affect perceived. Normal judgment and thought content.  Physical exam deferred due to nature of the encounter LMP 11/03/2021 (Approximate)   Depression screen [facility] 2/9 11/24/2021 10/13/2021 05/18/2020  Decreased Interest 1 3 1   Down, Depressed, Hopeless 1 3 2   PHQ - 2 Score 2 6 3   Altered sleeping 0 3 2  Tired, decreased energy 1 3 2   Change in appetite 2 3 2   Feeling bad or failure about yourself  0 1 1  Trouble concentrating 0 3 2  Moving slowly or fidgety/restless 2 0 0  Suicidal thoughts 0 1 0  PHQ-9 Score 7 20 12   Difficult doing work/chores - - -      GAD 7 : Generalized Anxiety Score 11/24/2021 10/13/2021 05/18/2020  Nervous, Anxious, on Edge 1 3 2   Control/stop worrying 0 3 0  Worry too much - different things 0 3 0  Trouble relaxing 2 3 1   Restless 1 2 0  Easily annoyed or irritable 1 3 2   Afraid - awful might happen 0 1 0  Total GAD 7 Score 5 18 5     Upstream - 11/24/21 1557       Pregnancy Intention  Screening   Does the patient want to become pregnant in the next year? No    Does the patient's partner want to become pregnant in the next year? No    Would the patient like to discuss contraceptive options today? No      Contraception Wrap Up   Current Method Vasectomy    End Method Vasectomy    Contraception Counseling Provided No               Labs and Imaging No results found for this or any previous visit (from the past 336 hour(s)). No results found.    Assessment and Plan:      1. Depression with anxiety Continue zoloft 25 mg, 1 daily has refills    Follow up in 6 months or sooner if needed    I discussed the assessment and treatment plan with the patient. The patient  was provided an opportunity to ask questions and all were answered. The patient agreed with the plan and demonstrated an understanding of the instructions.   The patient was advised to call back or seek an in-person evaluation/go to the ED if the symptoms worsen or if the condition fails to improve as anticipated.  I provided 10 minutes of non-face-to-face time during this encounter. I was in my office at [facility] during this encounter   Cyril Mourning, NP [facility] [employer]

## 2021-12-15 ENCOUNTER — Emergency Department (HOSPITAL_COMMUNITY)
Admission: EM | Admit: 2021-12-15 | Discharge: 2021-12-15 | Disposition: A | Discharge status: Home / Self Care | Payer: Medicaid Other | Attending: Emergency Medicine | Admitting: Emergency Medicine

## 2021-12-15 ENCOUNTER — Other Ambulatory Visit: Payer: Self-pay

## 2021-12-15 ENCOUNTER — Encounter (HOSPITAL_COMMUNITY): Payer: Self-pay

## 2021-12-15 ENCOUNTER — Emergency Department (HOSPITAL_COMMUNITY): Payer: Medicaid Other

## 2021-12-15 DIAGNOSIS — Z91040 Latex allergy status: Secondary | ICD-10-CM | POA: Diagnosis not present

## 2021-12-15 DIAGNOSIS — M79675 Pain in left toe(s): Secondary | ICD-10-CM | POA: Insufficient documentation

## 2021-12-15 DIAGNOSIS — M7989 Other specified soft tissue disorders: Secondary | ICD-10-CM | POA: Diagnosis not present

## 2021-12-15 DIAGNOSIS — M79672 Pain in left foot: Secondary | ICD-10-CM

## 2021-12-15 DIAGNOSIS — M25572 Pain in left ankle and joints of left foot: Secondary | ICD-10-CM | POA: Diagnosis not present

## 2021-12-15 MED ORDER — IBUPROFEN 400 MG PO TABS
600.0000 mg | ORAL_TABLET | Freq: Once | ORAL | Status: AC
  Administered 2021-12-15: 21:00:00 600 mg via ORAL
  Filled 2021-12-15: qty 2

## 2021-12-15 NOTE — ED Triage Notes (Signed)
Patient states she fell on her foot in a seat position and states swelling to left great toe and anterior foot. Patient ambulatory in triage.

## 2021-12-15 NOTE — ED Provider Notes (Signed)
[facility] Provider Note   CSN: [id number] Arrival date & time: 12/15/21  2034     History  Chief Complaint  Patient presents with   Toe Pain   Foot Pain    Jasmine Maynard is a 31 y.o. female with no significant past medical history who presents to the ED complaining on left great toe pain/swelling onset 1 hour ago prior to arrival.  Jasmine Maynard notes that Jasmine Maynard fell onto her foot in a seated position.  Jasmine Maynard has associated left foot pain.  Has not tried any medications for her symptoms.  Denies hitting her head, LOC, color change, wound, nausea, vomiting, fever, chills.  The history is provided by the patient. No language interpreter was used.      Home Medications Prior to Admission medications   Medication Sig Start Date End Date Taking? Authorizing Provider  acetaminophen (TYLENOL) 325 MG tablet Take 650 mg by mouth every 6 (six) hours as needed.    [provider]  albuterol (PROVENTIL) (2.5 MG/3ML) 0.083% nebulizer solution Take 3 mLs (2.5 mg total) by nebulization every 6 (six) hours as needed for wheezing or shortness of breath. 05/11/21   Idol, Raynelle FanningJulie, PA-C  azelastine (ASTELIN) 0.1 % nasal spray Place into both nostrils 2 (two) times daily. Use in each nostril as directed    [provider]  cetirizine (ZYRTEC) 10 MG chewable tablet Chew 10 mg by mouth daily.    [provider]  doxycycline (VIBRAMYCIN) 100 MG capsule Take 1 capsule (100 mg total) by mouth 2 (two) times daily. 11/24/21   Particia NearingLane, Rachel Elizabeth, PA-C  fluticasone (FLOVENT HFA) 44 MCG/ACT inhaler Inhale 1 puff into the lungs 2 (two) times daily. Patient not taking: Reported on 11/24/2021 05/11/21   Burgess AmorIdol, Julie, PA-C  ibuprofen (ADVIL) 600 MG tablet Take 1 tablet (600 mg total) by mouth every 6 (six) hours as needed. Patient not taking: Reported on 11/24/2021 10/31/20   Merrilee JanskyLamptey, Philip O, MD  montelukast (SINGULAIR) 10 MG tablet Take 1 tablet (10 mg total) by mouth at bedtime. 05/28/21    Alfonse SpruceGallagher, Joel Louis, MD  naproxen (NAPROSYN) 500 MG tablet Take 1 tablet (500 mg total) by mouth 2 (two) times daily with a meal. Patient not taking: Reported on 10/13/2021 12/20/20   Eber HongMiller, Brian, MD  sertraline (ZOLOFT) 25 MG tablet Take 1 tablet (25 mg total) by mouth daily. 10/13/21   Cheral MarkerBooker, Kimberly R, CNM  valACYclovir (VALTREX) 1000 MG tablet Take 1 tablet (1,000 mg total) by mouth daily. 10/13/21   Cheral MarkerBooker, Kimberly R, CNM  dicyclomine (BENTYL) 20 MG tablet Take 1 tablet (20 mg total) by mouth every 6 (six) hours as needed (abdominal cramping). 01/23/12 02/09/12  Samuel JesterMcManus, Kathleen, DO      Allergies    Amoxicillin, Avelox [moxifloxacin hcl in nacl], and Latex    Review of Systems   Review of Systems  Constitutional:  Negative for chills and fever.  Musculoskeletal:  Positive for arthralgias and joint swelling. Negative for gait problem.  Skin:  Negative for color change, rash and wound.  Neurological:  Negative for syncope.  All other systems reviewed and are negative.  Physical Exam Updated Vital Signs BP 135/60    Pulse 78    Temp 98.4 F (36.9 C) (Oral)    Resp 18    Ht 5\' 4"  (1.626 m)    Wt 106.6 kg    SpO2 99%    BMI 40.34 kg/m  Physical Exam Vitals and nursing note reviewed.  Constitutional:      General: Jasmine Maynard is not in acute distress.    Appearance: Jasmine Maynard is not diaphoretic.  HENT:     Head: Normocephalic and atraumatic.     Mouth/Throat:     Pharynx: No oropharyngeal exudate.  Eyes:     General: No scleral icterus.    Conjunctiva/sclera: Conjunctivae normal.  Cardiovascular:     Rate and Rhythm: Normal rate and regular rhythm.     Pulses: Normal pulses.     Heart sounds: Normal heart sounds.  Pulmonary:     Effort: Pulmonary effort is normal. No respiratory distress.     Breath sounds: Normal breath sounds. No wheezing.  Abdominal:     General: Bowel sounds are normal.     Palpations: Abdomen is soft. There is no mass.     Tenderness: There is no abdominal  tenderness. There is no guarding or rebound.  Musculoskeletal:        General: Normal range of motion.     Cervical back: Normal range of motion and neck supple.     Right foot: Normal.     Left foot: Normal range of motion and normal capillary refill. Swelling and tenderness present. No deformity. Normal pulse.     Comments: Mild tenderness to palpation noted to left great toe with mild swelling noted.  No overlying skin changes.  Full range of motion of bilateral toes, feet, ankles.  DP and PT pulses intact bilaterally.  Able to ambulate without assistance or difficulty.  Skin:    General: Skin is warm and dry.  Neurological:     Mental Status: Jasmine Maynard is alert.  Psychiatric:        Behavior: Behavior normal.    ED Results / Procedures / Treatments   Labs (all labs ordered are listed, but only abnormal results are displayed) Labs Reviewed - No data to display  EKG None  Radiology DG Ankle Complete Left  Result Date: 12/15/2021 CLINICAL DATA:  Fall, pain EXAM: LEFT ANKLE COMPLETE - 3+ VIEW COMPARISON:  None. FINDINGS: There is no evidence of fracture, dislocation, or joint effusion. There is no evidence of arthropathy or other focal bone abnormality. Soft tissues are unremarkable. IMPRESSION: Negative. Electronically Signed   By: Charlett Nose M.D.   On: 12/15/2021 21:22   DG Foot 2 Views Left  Result Date: 12/15/2021 CLINICAL DATA:  Fall, swelling to left great toe and anterior foot EXAM: LEFT FOOT - 2 VIEW COMPARISON:  None. FINDINGS: There is no evidence of fracture or dislocation. There is no evidence of arthropathy or other focal bone abnormality. Soft tissues are unremarkable. IMPRESSION: Negative. Electronically Signed   By: Charlett Nose M.D.   On: 12/15/2021 21:21    Procedures Procedures    Medications Ordered in ED Medications  ibuprofen (ADVIL) tablet 600 mg (600 mg Oral Given 12/15/21 2107)    ED Course/ Medical Decision Making/ A&P                           Medical  Decision Making Amount and/or Complexity of Data Reviewed Radiology: ordered.  Risk Prescription drug management.   Patient with left great toe pain onset prior to arrival status post fall onto left foot.  Denies hitting her head, LOC.  On exam, patient with mild tenderness to palpation to left great toe with mild swelling noted.  No erythema noted.  Sensation and pulses intact bilaterally to lower extremities.  Vital signs stable,  patient afebrile.  Differential diagnosis includes fracture, dislocation, sprain.   Imaging: I ordered imaging studies including left foot x-ray, left ankle x-ray I independently visualized and interpreted imaging which showed negative for acute fracture or dislocation I agree with the radiologist interpretation  Medications:  I ordered medication including ibuprofen and ice for pain management Reevaluation of the patient after these medicines showed that the patient improved I have reviewed the patients home medicines and have made adjustments as needed  Reevaluation: After the interventions noted above, I reevaluated the patient and found that they have :improved  Disposition: Patient presentation suspicious for musculoskeletal pain to left foot and great toe status post fall on foot.  Doubt fracture or dislocation at this time.  After consideration of the diagnostic results and the patients response to treatment, I feel that the patient would benefit from discharge home. Supportive care measures and strict return precautions discussed with patient at bedside. Pt acknowledges and verbalizes understanding. Pt appears safe for discharge. Follow up as indicated in discharge paperwork.    This chart was dictated using voice recognition software, Dragon. Despite the best efforts of this provider to proofread and correct errors, errors may still occur which can change documentation meaning.  Final Clinical Impression(s) / ED Diagnoses Final diagnoses:  Foot  pain, left  Great toe pain, left    Rx / DC Orders ED Discharge Orders     None         Cecilie Heidel A, PA-C 12/15/21 2251    Gerhard Munch, MD 12/16/21 1900

## 2021-12-15 NOTE — Discharge Instructions (Addendum)
It was a pleasure taking care of you today!  Your x-ray was negative for fracture or dislocation.  You may use ice to the affected area for up to 15 minutes at a time.  Ensure to place a barrier between your skin and the ice.  You may take over-the-counter 600 mg ibuprofen every 6 hours or 1,000 mg Tylenol every 6 hours as needed for pain.  Follow-up with your primary care provider as needed.  Return to the ED if you are having increasing swelling, inability to walk, redness, worsening symptoms.

## 2021-12-16 ENCOUNTER — Telehealth: Payer: Self-pay

## 2021-12-16 NOTE — Telephone Encounter (Signed)
Transition Care Management Unsuccessful Follow-up Telephone Call ° °Date of discharge and from where:  12/15/2021-[place]  ° °Attempts:  1st Attempt ° °Reason for unsuccessful TCM follow-up call:  Left voice message ° °  °

## 2021-12-17 NOTE — Telephone Encounter (Signed)
Transition Care Management Unsuccessful Follow-up Telephone Call ° °Date of discharge and from where:  12/15/2021-[place]  ° °Attempts:  2nd Attempt ° °Reason for unsuccessful TCM follow-up call:  Left voice message ° °  °

## 2021-12-20 NOTE — Telephone Encounter (Signed)
Transition Care Management Unsuccessful Follow-up Telephone Call  Date of discharge and from where:  12/15/2021-[place]   Attempts:  3rd Attempt  Reason for unsuccessful TCM follow-up call:  Left voice message

## 2022-04-21 ENCOUNTER — Ambulatory Visit
Admission: EM | Admit: 2022-04-21 | Discharge: 2022-04-21 | Disposition: A | Discharge status: Home / Self Care | Payer: Medicaid Other | Attending: Nurse Practitioner | Admitting: Nurse Practitioner

## 2022-04-21 ENCOUNTER — Encounter: Payer: Self-pay | Admitting: Emergency Medicine

## 2022-04-21 DIAGNOSIS — J0100 Acute maxillary sinusitis, unspecified: Secondary | ICD-10-CM | POA: Diagnosis not present

## 2022-04-21 MED ORDER — DOXYCYCLINE HYCLATE 100 MG PO CAPS: 100.0000 mg | ORAL_CAPSULE | Freq: Two times a day (BID) | ORAL | 0 refills | Status: DC

## 2022-04-21 MED ORDER — PSEUDOEPH-BROMPHEN-DM 30-2-10 MG/5ML PO SYRP
5.0000 mL | ORAL_SOLUTION | Freq: Four times a day (QID) | ORAL | 0 refills | Status: DC | PRN
Start: 2022-04-21 — End: 2022-08-21

## 2022-04-21 NOTE — ED Provider Notes (Signed)
[facility]    CSN: [id number] Arrival date & time: 04/21/22  0805      History   Chief Complaint No chief complaint on file.   HPI Jasmine Maynard is a 31 y.o. female.   HPI Patient presents with upper respiratory symptoms that been present for the past 2 weeks.  Symptoms include fever, headache, sinus pressure, nasal congestion, nasal drainage, sore throat, and right ear pressure.  Patient states over the past several days her cough has become productive and she is coughing up green sputum.  She is also states that her mucus is also become green and has sometimes been blood-tinged.  She does have a history of allergic rhinitis.  She currently takes Astepro and cetirizine and has been taking this since her symptoms started.  She denies wheezing, shortness of breath, or GI symptoms.  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   GERD (gastroesophageal reflux disease) 02/09/2015   Constipation 02/09/2015   Heartburn 01/21/2015   Vertigo 01/21/2015   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   Chronic low back pain 09/17/2013   Depression with anxiety 02/11/2013   ADHD (attention deficit hyperactivity disorder) 02/11/2013    Past Surgical History:  Procedure Laterality Date   CHOLECYSTECTOMY  02/13/2012   Procedure: LAPAROSCOPIC CHOLECYSTECTOMY;  Surgeon: Fabio Bering, MD;  Location: [facility];  Service: General;  Laterality: N/A;   INTRAUTERINE DEVICE  INSERTION  2011   MYRINGOTOMY  2011   right    OB History     Gravida  3   Para  3   Term  3   Preterm      AB      Living  3      SAB      IAB      Ectopic      Multiple  0   Live Births  3            Home Medications    Prior to Admission medications   Medication Sig Start Date End Date Taking? Authorizing Provider  brompheniramine-pseudoephedrine-DM 30-2-10 MG/5ML syrup Take 5 mLs by mouth 4 (four) times daily as needed. 04/21/22  Yes Leannah Guse-Warren, Sadie Haber, NP  doxycycline (VIBRAMYCIN) 100 MG capsule Take 1 capsule (100 mg total) by mouth 2 (two) times daily. 04/21/22  Yes Zaydyn Havey-Warren, Sadie Haber, NP  acetaminophen (TYLENOL) 325 MG tablet Take 650 mg by mouth every 6 (six) hours as needed.    [provider]  albuterol (PROVENTIL) (2.5 MG/3ML) 0.083% nebulizer solution Take 3 mLs (2.5 mg total) by nebulization every 6 (six) hours as needed for wheezing or shortness of breath. 05/11/21   Idol, Raynelle Fanning, PA-C  azelastine (ASTELIN) 0.1 % nasal spray Place into both nostrils 2 (two) times daily. Use in each nostril as directed    [provider]  cetirizine (ZYRTEC) 10 MG chewable tablet Chew 10  mg by mouth daily.    [provider]  fluticasone (FLOVENT HFA) 44 MCG/ACT inhaler Inhale 1 puff into the lungs 2 (two) times daily. Patient not taking: Reported on 11/24/2021 05/11/21   Burgess Amor, PA-C  ibuprofen (ADVIL) 600 MG tablet Take 1 tablet (600 mg total) by mouth every 6 (six) hours as needed. Patient not taking: Reported on 11/24/2021 10/31/20   Merrilee Jansky, MD  montelukast (SINGULAIR) 10 MG tablet Take 1 tablet (10 mg total) by mouth at bedtime. 05/28/21   Alfonse Spruce, MD  naproxen (NAPROSYN) 500 MG tablet Take 1 tablet (500 mg total) by mouth 2 (two) times daily with a meal. Patient not taking: Reported on 10/13/2021 12/20/20   Eber Hong, MD  sertraline (ZOLOFT) 25 MG tablet Take 1 tablet (25 mg total) by mouth daily.  10/13/21   Cheral Marker, CNM  valACYclovir (VALTREX) 1000 MG tablet Take 1 tablet (1,000 mg total) by mouth daily. 10/13/21   Cheral Marker, CNM  dicyclomine (BENTYL) 20 MG tablet Take 1 tablet (20 mg total) by mouth every 6 (six) hours as needed (abdominal cramping). 01/23/12 02/09/12  Samuel Jester, DO    Family History Family History  Problem Relation Age of Onset   Diabetes Paternal Grandfather    Heart attack Paternal Grandfather    Other Paternal Grandfather        aneursym   Diabetes Paternal Grandmother    Cancer Paternal Grandmother        breast,lung   Other Maternal Grandmother        heart issues; has stent; has a eating disorder   COPD Father    Other Father        heart issues   COPD Mother    Mental illness Sister    Autism Other     Social History Social History   Tobacco Use   Smoking status: Never   Smokeless tobacco: Never  Vaping Use   Vaping Use: Never used  Substance Use Topics   Alcohol use: Not Currently    Alcohol/week: 2.0 standard drinks    Types: 2 Cans of beer per week    Comment: occ   Drug use: No     Allergies   Amoxicillin, Avelox [moxifloxacin hcl in nacl], and Latex   Review of Systems Review of Systems Per HPI  Physical Exam Triage Vital Signs ED Triage Vitals  Enc Vitals Group     BP 04/21/22 0813 129/88     Pulse Rate 04/21/22 0813 89     Resp 04/21/22 0813 18     Temp 04/21/22 0813 98.4 F (36.9 C)     Temp Source 04/21/22 0813 Oral     SpO2 04/21/22 0813 98 %     Weight --      Height --      Head Circumference --      Peak Flow --      Pain Score 04/21/22 0814 5     Pain Loc --      Pain Edu? --      Excl. in [place]? --    No data found.  Updated Vital Signs BP 129/88 (BP Location: Right Arm)   Pulse 89   Temp 98.4 F (36.9 C) (Oral)   Resp 18   LMP 03/23/2022 (Approximate)   SpO2 98%   Visual Acuity Right Eye Distance:   Left Eye Distance:   Bilateral Distance:    Right Eye Near:  Left Eye Near:    Bilateral Near:     Physical Exam Vitals and nursing note reviewed.  Constitutional:      General: She is not in acute distress.    Appearance: Normal appearance.  HENT:     Head: Normocephalic.     Right Ear: Tympanic membrane, ear canal and external ear normal.     Left Ear: Tympanic membrane, ear canal and external ear normal.     Nose: Congestion present.     Right Turbinates: Enlarged and swollen.     Left Turbinates: Enlarged and swollen.     Right Sinus: Maxillary sinus tenderness present. No frontal sinus tenderness.     Left Sinus: Maxillary sinus tenderness present. No frontal sinus tenderness.     Mouth/Throat:     Mouth: Mucous membranes are moist.     Pharynx: Posterior oropharyngeal erythema present. No oropharyngeal exudate.  Eyes:     Extraocular Movements: Extraocular movements intact.     Conjunctiva/sclera: Conjunctivae normal.     Pupils: Pupils are equal, round, and reactive to light.  Cardiovascular:     Rate and Rhythm: Normal rate and regular rhythm.     Pulses: Normal pulses.     Heart sounds: Normal heart sounds.  Pulmonary:     Effort: Pulmonary effort is normal.     Breath sounds: Normal breath sounds.  Abdominal:     General: Bowel sounds are normal.     Palpations: Abdomen is soft.     Tenderness: There is no abdominal tenderness.  Musculoskeletal:     Cervical back: Normal range of motion.  Lymphadenopathy:     Cervical: No cervical adenopathy.  Skin:    General: Skin is warm and dry.  Neurological:     General: No focal deficit present.     Mental Status: She is alert and oriented to person, place, and time.  Psychiatric:        Mood and Affect: Mood normal.        Behavior: Behavior normal.     UC Treatments / Results  Labs (all labs ordered are listed, but only abnormal results are displayed) Labs Reviewed - No data to display  EKG   Radiology No results found.  Procedures Procedures (including  critical care time)  Medications Ordered in UC Medications - No data to display  Initial Impression / Assessment and Plan / UC Course  I have reviewed the triage vital signs and the nursing notes.  Pertinent labs & imaging results that were available during my care of the patient were reviewed by me and considered in my medical decision making (see chart for details).  Presents with upper respiratory symptoms that been present for the past 2 weeks.  Exam and vital signs are reassuring at this time.  She does have maxillary sinus pressure on exam.  Symptoms have been present for the past 2 weeks, with change in her sputum production.  We will start patient on doxycycline given the duration of her symptoms and worsening despite use of allergy medication.  Supportive care recommendations were provided.  Patient was given return precautions.  Follow-up as needed. Final Clinical Impressions(s) / UC Diagnoses   Final diagnoses:  Acute maxillary sinusitis, recurrence not specified     Discharge Instructions      Take medication as directed. Continue your current allergy medication regimen. Increase fluids and get plenty of rest. May take over-the-counter ibuprofen or Tylenol as needed for pain, fever, or general discomfort. Recommend  normal saline nasal spray to help with nasal congestion throughout the day. For your cough, it may be helpful to use a humidifier at bedtime during sleep. If your symptoms fail to improve within the next 7 to 10 days, please follow-up in our clinic.     ED Prescriptions     Medication Sig Dispense Auth. Provider   doxycycline (VIBRAMYCIN) 100 MG capsule Take 1 capsule (100 mg total) by mouth 2 (two) times daily. 20 capsule Isai Gottlieb-Warren, Sadie Haberhristie J, NP   brompheniramine-pseudoephedrine-DM 30-2-10 MG/5ML syrup Take 5 mLs by mouth 4 (four) times daily as needed. 140 mL Reiko Vinje-Warren, Sadie Haberhristie J, NP      PDMP not reviewed this encounter.   Abran CantorLeath-Warren,  Oretta Berkland J, NP 04/21/22 [phone]

## 2022-04-21 NOTE — Discharge Instructions (Addendum)
Take medication as directed. Continue your current allergy medication regimen. Increase fluids and get plenty of rest. May take over-the-counter ibuprofen or Tylenol as needed for pain, fever, or general discomfort. Recommend normal saline nasal spray to help with nasal congestion throughout the day. For your cough, it may be helpful to use a humidifier at bedtime during sleep. If your symptoms fail to improve within the next 7 to 10 days, please follow-up in our clinic.  

## 2022-04-21 NOTE — ED Triage Notes (Signed)
Nasal congestion coughing up green sputum x 2 weeks.  Has been taking zyrtec and nasal spray without relief.  Right ear pressure

## 2022-04-26 ENCOUNTER — Telehealth: Payer: Self-pay | Admitting: Emergency Medicine

## 2022-04-26 MED ORDER — DOXYCYCLINE HYCLATE 100 MG PO CAPS: 100.0000 mg | ORAL_CAPSULE | Freq: Two times a day (BID) | ORAL | 0 refills | Status: DC

## 2022-04-26 NOTE — Telephone Encounter (Signed)
Patient called and states she lost her mediation and had taken 5 days worth of meds.  5 more days of doxycyline have been called into [facility] for patient

## 2022-08-20 ENCOUNTER — Ambulatory Visit
Admission: EM | Admit: 2022-08-20 | Discharge: 2022-08-20 | Disposition: A | Discharge status: Home / Self Care | Payer: Medicaid Other | Attending: Family Medicine | Admitting: Family Medicine

## 2022-08-20 DIAGNOSIS — J039 Acute tonsillitis, unspecified: Secondary | ICD-10-CM | POA: Diagnosis not present

## 2022-08-20 DIAGNOSIS — R110 Nausea: Secondary | ICD-10-CM | POA: Insufficient documentation

## 2022-08-20 LAB — POCT RAPID STREP A (OFFICE): Rapid Strep A Screen: NEGATIVE

## 2022-08-20 LAB — POCT MONO SCREEN (KUC): Mono, POC: NEGATIVE

## 2022-08-20 MED ORDER — ONDANSETRON 4 MG PO TBDP
4.0000 mg | ORAL_TABLET | Freq: Once | ORAL | Status: AC
  Administered 2022-08-20: 4 mg via ORAL

## 2022-08-20 MED ORDER — ONDANSETRON 4 MG PO TBDP: 4.0000 mg | ORAL_TABLET | Freq: Three times a day (TID) | ORAL | 0 refills | Status: DC | PRN

## 2022-08-20 NOTE — ED Provider Notes (Addendum)
[facility]    CSN: [id number] Arrival date & time: 08/20/22  0843      History   Chief Complaint Chief Complaint  Patient presents with   Sore Throat   Fever   Cough    HPI Jasmine Maynard is a 31 y.o. female.   Patient presenting today with nausea, sore throat, fever, mild cough that started last night.  States the sore throat feels weird and that she is having difficulty swallowing.  Denies chest pain, shortness of breath, vomiting, diarrhea.  Taking Tylenol ibuprofen with minimal temporary relief of symptoms.  Daughter has recently been sick with somewhat similar symptoms, states daughter tested negative for strep, COVID, flu.    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   GERD (gastroesophageal reflux disease) 02/09/2015   Constipation 02/09/2015   Heartburn 01/21/2015   Vertigo 01/21/2015   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   Chronic low back pain 09/17/2013   Depression with anxiety 02/11/2013   ADHD (attention deficit hyperactivity disorder) 02/11/2013    Past Surgical History:  Procedure Laterality Date   CHOLECYSTECTOMY  02/13/2012   Procedure: LAPAROSCOPIC CHOLECYSTECTOMY;  Surgeon: Donato Heinz, MD;  Location: [facility];  Service: General;  Laterality: N/A;   INTRAUTERINE DEVICE INSERTION  2011   MYRINGOTOMY  2011   right    OB History     Gravida  3   Para  3   Term  3   Preterm      AB      Living  3       SAB      IAB      Ectopic      Multiple  0   Live Births  3            Home Medications    Prior to Admission medications   Medication Sig Start Date End Date Taking? Authorizing Provider  ondansetron (ZOFRAN-ODT) 4 MG disintegrating tablet Take 1 tablet (4 mg total) by mouth every 8 (eight) hours as needed for nausea or vomiting. 08/20/22  Yes Volney American, PA-C  acetaminophen (TYLENOL) 325 MG tablet Take 650 mg by mouth every 6 (six) hours as needed.    [provider]  albuterol (PROVENTIL) (2.5 MG/3ML) 0.083% nebulizer solution Take 3 mLs (2.5 mg total) by nebulization every 6 (six) hours as needed for wheezing or shortness of breath. 05/11/21   Idol, Almyra Free, PA-C  azelastine (ASTELIN) 0.1 % nasal spray Place into both nostrils 2 (two) times daily. Use in each nostril as directed    [provider]  brompheniramine-pseudoephedrine-DM 30-2-10 MG/5ML syrup Take 5 mLs by mouth 4 (four) times daily as needed. 04/21/22   Leath-Warren, Alda Lea, NP  cetirizine (ZYRTEC) 10 MG chewable tablet Chew 10 mg by mouth daily.    [provider]  doxycycline (VIBRAMYCIN) 100 MG capsule Take 1  capsule (100 mg total) by mouth 2 (two) times daily. 04/26/22   Leath-Warren, Alda Lea, NP  fluticasone (FLOVENT HFA) 44 MCG/ACT inhaler Inhale 1 puff into the lungs 2 (two) times daily. Patient not taking: Reported on 11/24/2021 05/11/21   Evalee Jefferson, PA-C  ibuprofen (ADVIL) 600 MG tablet Take 1 tablet (600 mg total) by mouth every 6 (six) hours as needed. Patient not taking: Reported on 11/24/2021 10/31/20   Chase Picket, MD  montelukast (SINGULAIR) 10 MG tablet Take 1 tablet (10 mg total) by mouth at bedtime. 05/28/21   Valentina Shaggy, MD  naproxen (NAPROSYN) 500 MG tablet Take 1 tablet (500 mg total) by mouth 2 (two) times daily with a meal. Patient not taking: Reported on 10/13/2021 12/20/20   Noemi Chapel, MD  sertraline (ZOLOFT) 25 MG tablet Take 1 tablet  (25 mg total) by mouth daily. 10/13/21   Roma Schanz, CNM  valACYclovir (VALTREX) 1000 MG tablet Take 1 tablet (1,000 mg total) by mouth daily. 10/13/21   Roma Schanz, CNM  dicyclomine (BENTYL) 20 MG tablet Take 1 tablet (20 mg total) by mouth every 6 (six) hours as needed (abdominal cramping). 01/23/12 02/09/12  Francine Graven, DO    Family History Family History  Problem Relation Age of Onset   Diabetes Paternal Grandfather    Heart attack Paternal Grandfather    Other Paternal Grandfather        aneursym   Diabetes Paternal Grandmother    Cancer Paternal Grandmother        breast,lung   Other Maternal Grandmother        heart issues; has stent; has a eating disorder   COPD Father    Other Father        heart issues   COPD Mother    Mental illness Sister    Autism Other     Social History Social History   Tobacco Use   Smoking status: Never   Smokeless tobacco: Never  Vaping Use   Vaping Use: Never used  Substance Use Topics   Alcohol use: Not Currently    Alcohol/week: 2.0 standard drinks of alcohol    Types: 2 Cans of beer per week    Comment: occ   Drug use: No     Allergies   Amoxicillin, Avelox [moxifloxacin hcl in nacl], and Latex   Review of Systems Review of Systems Per HPI  Physical Exam Triage Vital Signs ED Triage Vitals  Enc Vitals Group     BP 08/20/22 0905 115/81     Pulse Rate 08/20/22 0905 68     Resp 08/20/22 0905 16     Temp 08/20/22 0905 99.4 F (37.4 C)     Temp Source 08/20/22 0905 Oral     SpO2 08/20/22 0905 97 %     Weight --      Height --      Head Circumference --      Peak Flow --      Pain Score 08/20/22 0907 7     Pain Loc --      Pain Edu? --      Excl. in [place]? --    No data found.  Updated Vital Signs BP 115/81 (BP Location: Right Arm)   Pulse 68   Temp 99.4 F (37.4 C) (Oral)   Resp 16   LMP  (Within Weeks) Comment: 6 weeks  SpO2 97%   Visual Acuity Right Eye Distance:   Left Eye  Distance:   Bilateral Distance:    Right Eye Near:   Left Eye Near:    Bilateral Near:     Physical Exam Vitals and nursing note reviewed.  Constitutional:      Appearance: Normal appearance.  HENT:     Head: Atraumatic.     Right Ear: Tympanic membrane and external ear normal.     Left Ear: Tympanic membrane and external ear normal.     Nose: Nose normal.     Mouth/Throat:     Mouth: Mucous membranes are moist.     Pharynx: Posterior oropharyngeal erythema present. No oropharyngeal exudate.     Comments: Bilateral tonsillar erythema, edema, postnasal drainage present Eyes:     Extraocular Movements: Extraocular movements intact.     Conjunctiva/sclera: Conjunctivae normal.  Cardiovascular:     Rate and Rhythm: Normal rate and regular rhythm.     Heart sounds: Normal heart sounds.  Pulmonary:     Effort: Pulmonary effort is normal.     Breath sounds: Normal breath sounds. No wheezing.  Musculoskeletal:        General: Normal range of motion.     Cervical back: Normal range of motion and neck supple.  Lymphadenopathy:     Cervical: No cervical adenopathy.  Skin:    General: Skin is warm and dry.  Neurological:     Mental Status: She is alert and oriented to person, place, and time.  Psychiatric:        Mood and Affect: Mood normal.        Thought Content: Thought content normal.      UC Treatments / Results  Labs (all labs ordered are listed, but only abnormal results are displayed) Labs Reviewed  CULTURE, GROUP A STREP [facility])  POCT RAPID STREP A (OFFICE)  POCT MONO SCREEN [facility])    EKG   Radiology No results found.  Procedures Procedures (including critical care time)  Medications Ordered in UC Medications  ondansetron (ZOFRAN-ODT) disintegrating tablet 4 mg (4 mg Oral Given 08/20/22 0945)    Initial Impression / Assessment and Plan / UC Course  I have reviewed the triage vital signs and the nursing notes.  Pertinent labs & imaging results that  were available during my care of the patient were reviewed by me and considered in my medical decision making (see chart for details).     Rapid strep and rapid mono negative, vital signs benign and reassuring, declines viral testing today.  Throat culture pending.  Treat symptomatically with Zofran, DayQuil NyQuil, ibuprofen, Tylenol and other supportive measures.  Dose of Zofran given prior to discharge for active nausea.  Return for any worsening symptoms.  Final Clinical Impressions(s) / UC Diagnoses   Final diagnoses:  Acute tonsillitis, unspecified etiology  Nausea without vomiting   Discharge Instructions   None    ED Prescriptions     Medication Sig Dispense Auth. Provider   ondansetron (ZOFRAN-ODT) 4 MG disintegrating tablet Take 1 tablet (4 mg total) by mouth every 8 (eight) hours as needed for nausea or vomiting. 20 tablet Volney American, [place]      PDMP not reviewed this encounter.   Volney American, PA-C 08/20/22 [place], [place], PA-C 08/20/22 [phone]

## 2022-08-20 NOTE — ED Triage Notes (Signed)
Pt reports nausea, sore throat, fever and cough since last night. Tylenol and ibuprofen gives some relief.

## 2022-08-21 ENCOUNTER — Ambulatory Visit
Admission: EM | Admit: 2022-08-21 | Discharge: 2022-08-21 | Disposition: A | Discharge status: Home / Self Care | Payer: Medicaid Other | Attending: Emergency Medicine | Admitting: Emergency Medicine

## 2022-08-21 DIAGNOSIS — B084 Enteroviral vesicular stomatitis with exanthem: Secondary | ICD-10-CM

## 2022-08-21 MED ORDER — NAPROXEN 500 MG PO TABS: 500.0000 mg | ORAL_TABLET | Freq: Two times a day (BID) | ORAL | 0 refills | Status: DC

## 2022-08-21 NOTE — Discharge Instructions (Addendum)
You appear to have hand-foot-and-mouth disease.  1 gram of Tylenol and 500 mg of Naprosyn together twice a day as needed for pain.  You can take an additional 1000 mg of Tylenol 1 or 2 times a day.  Do not exceed 4000 mg of Tylenol from all sources in 1 day.  Make sure you drink plenty of extra fluids.  Some people find salt water gargles and  Traditional Medic[employer]'s "Throat Coat" tea helpful. Take 5 mL of liquid Benadryl and 5 mL of Maalox. Mix it together, and then hold it in your mouth for as long as you can and then swallow. You may do this 4 times a day.    Go to www.goodrx.com  or www.costplusdrugs.com to look up your medications. This will give you a list of where you can find your prescriptions at the most affordable prices. Or ask the pharmacist what the cash price is, or if they have any other discount programs available to help make your medication more affordable. This can be less expensive than what you would pay with insurance.

## 2022-08-21 NOTE — ED Provider Notes (Signed)
HPI  SUBJECTIVE:  Jasmine Maynard is a 31 y.o. female who presents with 3 to 4 days of a burning sore throat, fevers Tmax 102 and a painful, pruritic blistery rash starting on her hands last night.  Her daughter had similar symptoms with sore throat and a fever, and a rash that lasted several hours.  No nasal congestion, rhinorrhea, postnasal drip, cough, wheeze, shortness of breath, intraoral ulcers, rash elsewhere.  She was seen here yesterday for sore throat, strep, mono were negative, and throat culture was sent.  This is still pending.  She has been taking Tylenol and ibuprofen and tried topical cortisone on the hand lesions.  The ibuprofen and Tylenol helps.  Last dose of ibuprofen was within 6 hours of evaluation.  No known exposure to hand-foot-and-mouth, but her son currently has a perioral rash, which was thought to be fungal.  He has more lesions around his mouth today.  She has a past medical history of asthma, allergies.  LMP: 6 weeks ago.  Denies possibility being pregnant.  PCP: None.    Past Medical History:  Diagnosis Date   ADHD (attention deficit hyperactivity disorder)    Asthma    DDD (degenerative disc disease)    in back   Depression    fine problem   Heartburn    Herpes    Perennial allergic rhinitis    Pregnant 01/21/2015   Pregnant 01/26/2016   Vertigo     Past Surgical History:  Procedure Laterality Date   CHOLECYSTECTOMY  02/13/2012   Procedure: LAPAROSCOPIC CHOLECYSTECTOMY;  Surgeon: Donato Heinz, MD;  Location: [facility];  Service: General;  Laterality: N/A;   INTRAUTERINE DEVICE INSERTION  2011   MYRINGOTOMY  2011   right    Family History  Problem Relation Age of Onset   Diabetes Paternal Grandfather    Heart attack Paternal Grandfather    Other Paternal Grandfather        aneursym   Diabetes Paternal Grandmother    Cancer Paternal Grandmother        breast,lung   Other Maternal Grandmother        heart issues; has stent; has a eating disorder    COPD Father    Other Father        heart issues   COPD Mother    Mental illness Sister    Autism Other     Social History   Tobacco Use   Smoking status: Never   Smokeless tobacco: Never  Vaping Use   Vaping Use: Never used  Substance Use Topics   Alcohol use: Not Currently    Alcohol/week: 2.0 standard drinks of alcohol    Types: 2 Cans of beer per week    Comment: occ   Drug use: No    No current facility-administered medications for this encounter.  Current Outpatient Medications:    naproxen (NAPROSYN) 500 MG tablet, Take 1 tablet (500 mg total) by mouth 2 (two) times daily., Disp: 20 tablet, Rfl: 0   acetaminophen (TYLENOL) 325 MG tablet, Take 650 mg by mouth every 6 (six) hours as needed., Disp: , Rfl:    albuterol (PROVENTIL) (2.5 MG/3ML) 0.083% nebulizer solution, Take 3 mLs (2.5 mg total) by nebulization every 6 (six) hours as needed for wheezing or shortness of breath., Disp: 75 mL, Rfl: 1   azelastine (ASTELIN) 0.1 % nasal spray, Place into both nostrils 2 (two) times daily. Use in each nostril as directed, Disp: , Rfl:    cetirizine (  ZYRTEC) 10 MG chewable tablet, Chew 10 mg by mouth daily., Disp: , Rfl:    montelukast (SINGULAIR) 10 MG tablet, Take 1 tablet (10 mg total) by mouth at bedtime., Disp: 30 tablet, Rfl: 1   sertraline (ZOLOFT) 25 MG tablet, Take 1 tablet (25 mg total) by mouth daily., Disp: 90 tablet, Rfl: 3   valACYclovir (VALTREX) 1000 MG tablet, Take 1 tablet (1,000 mg total) by mouth daily., Disp: 90 tablet, Rfl: 3  Allergies  Allergen Reactions   Amoxicillin     Reaction: INEFFECTIVE Has patient had a PCN reaction causing immediate rash, facial/tongue/throat swelling, SOB or lightheadedness with hypotension: No Has patient had a PCN reaction causing severe rash involving mucus membranes or skin necrosis: No Has patient had a PCN reaction that required hospitalization No Has patient had a PCN reaction occurring within the last 10 years: No If all  of the above answers are "NO", then may proceed with Cephalosporin use.    Avelox [Moxifloxacin Hcl In Nacl] Diarrhea and Nausea And Vomiting   Latex Rash     ROS  As noted in HPI.   Physical Exam  BP 123/82 (BP Location: Right Arm)   Pulse 91   Temp 98.6 F (37 C) (Oral)   Resp 16   LMP  (Within Weeks)   SpO2 98%   Constitutional: Well developed, well nourished, no acute distress Eyes:  EOMI, conjunctiva normal bilaterally HENT: Normocephalic, atraumatic,mucus membranes moist.  Intensely erythematous, swollen tonsils without exudates.  Positive petechiae on palate.  Uvula midline. Neck: Positive anterior, posterior cervical lymphadenopathy Respiratory: Normal inspiratory effort, lungs clear bilaterally Cardiovascular: Normal rate, regular rhythm, no murmurs GI: nondistended soft, nontender, no splenomegaly skin: Tender blistery rash on palms of hands        Musculoskeletal: no deformities Neurologic: Alert & oriented x 3, no focal neuro deficits Psychiatric: Speech and behavior appropriate   ED Course   Medications - No data to display  Orders Placed This Encounter  Procedures   Nursing Communication Please set up with a PCP prior to discharge    Please set up with a PCP prior to discharge    Standing Status:   Standing    Number of Occurrences:   1    No results found for this or any previous visit (from the past 24 hour(s)). No results found.  ED Clinical Impression  1. Hand, foot and mouth disease      ED Assessment/Plan     Previous records reviewed.  Strep, mono negative yesterday.  Throat culture still pending.  Presentation concerning for hand-foot-and-mouth disease.  Home with Benadryl/Maalox mixture, Naprosyn/Tylenol.  Discussed with her that there are no antibiotics for this specific infection.  She works from home.  Advised her that this is extremely contagious.  Staff will set her up with a PCP prior to discharge.  ER return  precautions given.  Discussed labs, MDM, treatment plan, and plan for follow-up with patient. Discussed sn/sx that should prompt return to the ED. patient agrees with plan.   Meds ordered this encounter  Medications   naproxen (NAPROSYN) 500 MG tablet    Sig: Take 1 tablet (500 mg total) by mouth 2 (two) times daily.    Dispense:  20 tablet    Refill:  0      *This clinic note was created using [employer]. Therefore, there may be occasional mistakes despite careful proofreading.  ?    Melynda Ripple, MD 08/22/22 1058

## 2022-08-21 NOTE — ED Triage Notes (Signed)
Pt reports itching rash in hand since last night. Denies any new medications, food, detergents, soaps, lotions. Pt has not taken or used any meds for complaint.

## 2022-08-22 LAB — CULTURE, GROUP A STREP (THRC)

## 2022-09-26 ENCOUNTER — Ambulatory Visit (INDEPENDENT_AMBULATORY_CARE_PROVIDER_SITE_OTHER): Payer: Medicaid Other

## 2022-09-26 ENCOUNTER — Ambulatory Visit
Admission: EM | Admit: 2022-09-26 | Discharge: 2022-09-26 | Disposition: A | Discharge status: Home / Self Care | Payer: Medicaid Other | Attending: Physician Assistant | Admitting: Physician Assistant

## 2022-09-26 DIAGNOSIS — J180 Bronchopneumonia, unspecified organism: Secondary | ICD-10-CM

## 2022-09-26 DIAGNOSIS — R112 Nausea with vomiting, unspecified: Secondary | ICD-10-CM | POA: Diagnosis not present

## 2022-09-26 DIAGNOSIS — J329 Chronic sinusitis, unspecified: Secondary | ICD-10-CM | POA: Diagnosis not present

## 2022-09-26 DIAGNOSIS — R197 Diarrhea, unspecified: Secondary | ICD-10-CM | POA: Diagnosis not present

## 2022-09-26 DIAGNOSIS — J40 Bronchitis, not specified as acute or chronic: Secondary | ICD-10-CM

## 2022-09-26 DIAGNOSIS — R0602 Shortness of breath: Secondary | ICD-10-CM | POA: Diagnosis not present

## 2022-09-26 DIAGNOSIS — R059 Cough, unspecified: Secondary | ICD-10-CM

## 2022-09-26 MED ORDER — PREDNISONE 20 MG PO TABS: 40.0000 mg | ORAL_TABLET | Freq: Every day | ORAL | 0 refills | Status: AC

## 2022-09-26 MED ORDER — AZITHROMYCIN 250 MG PO TABS: 250.0000 mg | ORAL_TABLET | Freq: Every day | ORAL | 0 refills | Status: DC

## 2022-09-26 MED ORDER — PROMETHAZINE-DM 6.25-15 MG/5ML PO SYRP: 5.0000 mL | ORAL_SOLUTION | Freq: Three times a day (TID) | ORAL | 0 refills | Status: DC | PRN

## 2022-09-26 MED ORDER — CEFPODOXIME PROXETIL 200 MG PO TABS: 200.0000 mg | ORAL_TABLET | Freq: Two times a day (BID) | ORAL | 0 refills | Status: DC

## 2022-09-26 MED ORDER — IPRATROPIUM-ALBUTEROL 0.5-2.5 (3) MG/3ML IN SOLN
3.0000 mL | Freq: Once | RESPIRATORY_TRACT | Status: AC
  Administered 2022-09-26: 3 mL via RESPIRATORY_TRACT

## 2022-09-26 MED ORDER — ONDANSETRON 4 MG PO TBDP
4.0000 mg | ORAL_TABLET | Freq: Once | ORAL | Status: AC
  Administered 2022-09-26: 4 mg via ORAL

## 2022-09-26 MED ORDER — CEFUROXIME AXETIL 500 MG PO TABS: 500.0000 mg | ORAL_TABLET | Freq: Two times a day (BID) | ORAL | 0 refills | Status: DC

## 2022-09-26 NOTE — ED Provider Notes (Addendum)
[facility]    CSN: [id number] Arrival date & time: 09/26/22  1224      History   Chief Complaint Chief Complaint  Patient presents with   Cough   Fever   Emesis   Diarrhea    HPI Jasmine Maynard is a 31 y.o. female.   Patient presents today with a several week history of persistent/worsening URI symptoms.  Reports that for the past several months she has been getting sick intermittently with current episode beginning several weeks ago.  She initially thought she was feeling better only to have symptoms recur and worsen more recently.  She reports severe cough, chest tightness, wheezing, fever, nausea, vomiting, diarrhea, fatigue, malaise.  She denies any chest pain, lightheadedness, weakness.  She has tried multiple over-the-counter medications including Tylenol without improvement of symptoms.  She has been taking prescribed Zofran with minimal improvement of symptoms.  She has a history of allergies but reports current symptoms are much more extreme than typical allergies.  She does have a history of asthma and has been using her albuterol inhaler more frequently as a result of symptoms without improvement.  Denies any recent antibiotics or steroids.  She is confident that she is not pregnant as she is currently on her menstrual cycle.    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   GERD (gastroesophageal reflux disease) 02/09/2015   Constipation 02/09/2015    Heartburn 01/21/2015   Vertigo 01/21/2015   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   Chronic low back pain 09/17/2013   Depression with anxiety 02/11/2013   ADHD (attention deficit hyperactivity disorder) 02/11/2013    Past Surgical History:  Procedure Laterality Date   CHOLECYSTECTOMY  02/13/2012   Procedure: LAPAROSCOPIC CHOLECYSTECTOMY;  Surgeon: Donato Heinz, MD;  Location: [facility];  Service: General;  Laterality: N/A;   INTRAUTERINE DEVICE INSERTION  2011   MYRINGOTOMY  2011   right    OB History     Gravida  3   Para  3   Term  3   Preterm      AB      Living  3      SAB      IAB      Ectopic      Multiple  0   Live Births  3            Home Medications    Prior to Admission medications   Medication Sig Start Date End Date Taking? Authorizing Provider  acetaminophen (TYLENOL) 325 MG tablet Take 650 mg by mouth every 6 (six) hours as needed.   Yes [provider]  albuterol (PROVENTIL) (2.5 MG/3ML) 0.083% nebulizer solution Take 3 mLs (2.5 mg total) by nebulization every 6 (six) hours as needed for wheezing or shortness of breath. 05/11/21  Yes Idol, Almyra Free, PA-C  azelastine (ASTELIN) 0.1 % nasal spray Place into  both nostrils 2 (two) times daily. Use in each nostril as directed   Yes [provider]  azithromycin (ZITHROMAX) 250 MG tablet Take 1 tablet (250 mg total) by mouth daily. Take first 2 tablets together, then 1 every day until finished. 09/26/22  Yes Khian Remo K, PA-C  cefpodoxime (VANTIN) 200 MG tablet Take 1 tablet (200 mg total) by mouth 2 (two) times daily. 09/26/22  Yes Sury Wentworth, Denny Peon K, PA-C  cetirizine (ZYRTEC) 10 MG chewable tablet Chew 10 mg by mouth daily.   Yes [provider]  montelukast (SINGULAIR) 10 MG tablet Take 1 tablet (10 mg total) by mouth at bedtime. 05/28/21  Yes Alfonse Spruce, MD  predniSONE (DELTASONE) 20 MG tablet Take 2 tablets (40 mg total) by mouth daily for 5 days.  09/26/22 10/01/22 Yes Kenika Sahm K, PA-C  promethazine-dextromethorphan (PROMETHAZINE-DM) 6.25-15 MG/5ML syrup Take 5 mLs by mouth 3 (three) times daily as needed for cough. 09/26/22  Yes Darsha Zumstein K, PA-C  sertraline (ZOLOFT) 25 MG tablet Take 1 tablet (25 mg total) by mouth daily. 10/13/21  Yes Cheral Marker, CNM  valACYclovir (VALTREX) 1000 MG tablet Take 1 tablet (1,000 mg total) by mouth daily. 10/13/21  Yes Cheral Marker, CNM  dicyclomine (BENTYL) 20 MG tablet Take 1 tablet (20 mg total) by mouth every 6 (six) hours as needed (abdominal cramping). 01/23/12 02/09/12  Samuel Jester, DO    Family History Family History  Problem Relation Age of Onset   Diabetes Paternal Grandfather    Heart attack Paternal Grandfather    Other Paternal Grandfather        aneursym   Diabetes Paternal Grandmother    Cancer Paternal Grandmother        breast,lung   Other Maternal Grandmother        heart issues; has stent; has a eating disorder   COPD Father    Other Father        heart issues   COPD Mother    Mental illness Sister    Autism Other     Social History Social History   Tobacco Use   Smoking status: Never   Smokeless tobacco: Never  Vaping Use   Vaping Use: Never used  Substance Use Topics   Alcohol use: Not Currently    Alcohol/week: 2.0 standard drinks of alcohol    Types: 2 Cans of beer per week    Comment: occ   Drug use: No     Allergies   Amoxicillin, Avelox [moxifloxacin hcl in nacl], Doxycycline, and Latex   Review of Systems Review of Systems  Constitutional:  Positive for activity change, fatigue and fever. Negative for appetite change.  HENT:  Positive for congestion, postnasal drip, sinus pressure and sore throat. Negative for sneezing.   Respiratory:  Positive for cough, chest tightness and shortness of breath.   Cardiovascular:  Negative for chest pain.  Gastrointestinal:  Positive for diarrhea, nausea and vomiting. Negative for abdominal  pain.  Neurological:  Negative for dizziness, light-headedness and headaches.     Physical Exam Triage Vital Signs ED Triage Vitals  Enc Vitals Group     BP 09/26/22 1255 128/81     Pulse Rate 09/26/22 1255 (!) 108     Resp 09/26/22 1255 20     Temp 09/26/22 1255 100.2 F (37.9 C)     Temp Source 09/26/22 1255 Oral     SpO2 09/26/22 1255 98 %     Weight --  Height --      Head Circumference --      Peak Flow --      Pain Score 09/26/22 1256 6     Pain Loc --      Pain Edu? --      Excl. in GC? --    No data found.  Updated Vital Signs BP 128/81 (BP Location: Right Arm)   Pulse (!) 108   Temp 100.2 F (37.9 C) (Oral)   Resp 20   LMP 09/26/2022 (Exact Date)   SpO2 98%   Visual Acuity Right Eye Distance:   Left Eye Distance:   Bilateral Distance:    Right Eye Near:   Left Eye Near:    Bilateral Near:     Physical Exam Vitals reviewed.  Constitutional:      General: She is awake. She is not in acute distress.    Appearance: Normal appearance. She is well-developed. She is not ill-appearing.     Comments: Very pleasant female appears stated age in no acute distress sitting comfortably in exam room holding emesis bag.  HENT:     Head: Normocephalic and atraumatic.     Right Ear: Tympanic membrane, ear canal and external ear normal. Tympanic membrane is not erythematous or bulging.     Left Ear: Tympanic membrane, ear canal and external ear normal. Tympanic membrane is not erythematous or bulging.     Nose:     Right Sinus: Maxillary sinus tenderness and frontal sinus tenderness present.     Left Sinus: Maxillary sinus tenderness and frontal sinus tenderness present.     Mouth/Throat:     Pharynx: Uvula midline. Posterior oropharyngeal erythema present. No oropharyngeal exudate.     Comments: Erythema and drainage in posterior oropharynx Cardiovascular:     Rate and Rhythm: Normal rate and regular rhythm.     Heart sounds: Normal heart sounds, S1 normal and  S2 normal. No murmur heard. Pulmonary:     Effort: Pulmonary effort is normal.     Breath sounds: Wheezing present. No rhonchi or rales.     Comments: Scattered wheezing and reactive cough with deep breathing Psychiatric:        Behavior: Behavior is cooperative.      UC Treatments / Results  Labs (all labs ordered are listed, but only abnormal results are displayed) Labs Reviewed  CBC WITH DIFFERENTIAL/PLATELET  COMPREHENSIVE METABOLIC PANEL    EKG   Radiology DG Chest 2 View  Result Date: 09/26/2022 CLINICAL DATA:  Progressive cough for the past 3 weeks EXAM: CHEST - 2 VIEW COMPARISON:  None Available. FINDINGS: Subtle patchy airspace opacity in the lingula may reflect bronchopneumonia. The lungs are otherwise clear. No acute osseous abnormality. Cardiac and mediastinal contours are normal. IMPRESSION: Subtle patchy airspace opacity in the lingula may reflect bronchopneumonia. Electronically Signed   By: Malachy Moan M.D.   On: 09/26/2022 13:34    Procedures Procedures (including critical care time)  Medications Ordered in UC Medications  ipratropium-albuterol (DUONEB) 0.5-2.5 (3) MG/3ML nebulizer solution 3 mL (3 mLs Nebulization Given 09/26/22 1331)  ondansetron (ZOFRAN-ODT) disintegrating tablet 4 mg (4 mg Oral Given 09/26/22 1331)    Initial Impression / Assessment and Plan / UC Course  I have reviewed the triage vital signs and the nursing notes.  Pertinent labs & imaging results that were available during my care of the patient were reviewed by me and considered in my medical decision making (see chart for details).  No indication for viral testing as patient has been symptomatic for several weeks.  X-ray was obtained given persistent/worsening cough that showed likely bronchial pneumonia.  She was started on Vantin and azithromycin.  Recommended she also start prednisone and continue using her albuterol inhaler regularly actively for asthma has also been  flared.  Promethazine DM sent to pharmacy with instructions to drive drink alcohol taking this medication as drowsiness is a common side effect.  She can use over-the-counter medication for additional symptom relief.  She is to rest and drink plenty of fluid.  Discussed that she should return within 1 week for reevaluation ideally with her primary care but she can return here if she is unable to see her PCP.  Discussed that if she has any worsening symptoms she needs to be seen immediately.  Strict return precautions given.  Work excuse note provided.  Received a call from patient's that cefpodoxime is not covered by insurance.  This was discontinued and Ceftin was sent to pharmacy.  Final Clinical Impressions(s) / UC Diagnoses   Final diagnoses:  Shortness of breath  Nausea vomiting and diarrhea  Bronchopneumonia     Discharge Instructions      Your x-ray did show that you have bronchial pneumonia.  We are starting several antibiotics to help with this.  I would like you to follow-up with your primary care next week to be reevaluated.  Continue your albuterol and add prednisone.  Continue using Zofran as needed.  Make sure that you rest and drink plenty of fluid.  If at any point you have worsening symptoms you need to go to the emergency room immediately.     ED Prescriptions     Medication Sig Dispense Auth. Provider   azithromycin (ZITHROMAX) 250 MG tablet Take 1 tablet (250 mg total) by mouth daily. Take first 2 tablets together, then 1 every day until finished. 6 tablet Tomio Kirk K, PA-C   cefpodoxime (VANTIN) 200 MG tablet Take 1 tablet (200 mg total) by mouth 2 (two) times daily. 20 tablet Sohrab Keelan K, PA-C   predniSONE (DELTASONE) 20 MG tablet Take 2 tablets (40 mg total) by mouth daily for 5 days. 10 tablet Rosamund Nyland K, PA-C   promethazine-dextromethorphan (PROMETHAZINE-DM) 6.25-15 MG/5ML syrup Take 5 mLs by mouth 3 (three) times daily as needed for cough. 118 mL Decari Duggar,  Reagen Goates K, PA-C      PDMP not reviewed this encounter.   Jeani Hawking, PA-C 09/26/22 1420    Caren Garske, Noberto Retort, PA-C 09/26/22 1608

## 2022-09-26 NOTE — ED Triage Notes (Signed)
Throwing up and diarrhea started this morning with chills and body aches. Has been sick for the past 3 months on and off. Having a cough has been there on and off for the past 3 months. Took tylenol an hour ago.

## 2022-09-26 NOTE — Discharge Instructions (Addendum)
Your x-ray did show that you have bronchial pneumonia.  We are starting several antibiotics to help with this.  I would like you to follow-up with your primary care next week to be reevaluated.  Continue your albuterol and add prednisone.  Continue using Zofran as needed.  Make sure that you rest and drink plenty of fluid.  If at any point you have worsening symptoms you need to go to the emergency room immediately.

## 2022-09-27 LAB — COMPREHENSIVE METABOLIC PANEL
ALT: 19 IU/L (ref 0–32)
AST: 21 IU/L (ref 0–40)
Albumin/Globulin Ratio: 1.6 (ref 1.2–2.2)
Albumin: 4.6 g/dL (ref 3.9–4.9)
Alkaline Phosphatase: 71 IU/L (ref 44–121)
BUN/Creatinine Ratio: 14 (ref 9–23)
BUN: 10 mg/dL (ref 6–20)
Bilirubin Total: 0.5 mg/dL (ref 0.0–1.2)
CO2: 18 mmol/L — ABNORMAL LOW (ref 20–29)
Calcium: 8.9 mg/dL (ref 8.7–10.2)
Chloride: 106 mmol/L (ref 96–106)
Creatinine, Ser: 0.7 mg/dL (ref 0.57–1.00)
Globulin, Total: 2.8 g/dL (ref 1.5–4.5)
Glucose: 116 mg/dL — ABNORMAL HIGH (ref 70–99)
Potassium: 4 mmol/L (ref 3.5–5.2)
Sodium: 140 mmol/L (ref 134–144)
Total Protein: 7.4 g/dL (ref 6.0–8.5)
eGFR: 118 mL/min/{1.73_m2} (ref >59)

## 2022-09-27 LAB — CBC WITH DIFFERENTIAL/PLATELET
Basophils Absolute: 0 10*3/uL (ref 0.0–0.2)
Basos: 0 %
EOS (ABSOLUTE): 0.1 10*3/uL (ref 0.0–0.4)
Eos: 1 %
Hematocrit: 46 % (ref 34.0–46.6)
Hemoglobin: 15.7 g/dL (ref 11.1–15.9)
Immature Grans (Abs): 0 10*3/uL (ref 0.0–0.1)
Immature Granulocytes: 0 %
Lymphocytes Absolute: 0.5 10*3/uL — ABNORMAL LOW (ref 0.7–3.1)
Lymphs: 5 %
MCH: 29.2 pg (ref 26.6–33.0)
MCHC: 34.1 g/dL (ref 31.5–35.7)
MCV: 86 fL (ref 79–97)
Monocytes Absolute: 0.3 10*3/uL (ref 0.1–0.9)
Monocytes: 3 %
Neutrophils Absolute: 10.5 10*3/uL — ABNORMAL HIGH (ref 1.4–7.0)
Neutrophils: 91 %
Platelets: 233 10*3/uL (ref 150–450)
RBC: 5.37 x10E6/uL — ABNORMAL HIGH (ref 3.77–5.28)
RDW: 12.5 % (ref 11.7–15.4)
WBC: 11.5 10*3/uL — ABNORMAL HIGH (ref 3.4–10.8)

## 2022-11-22 ENCOUNTER — Encounter: Payer: Self-pay | Admitting: Emergency Medicine

## 2022-11-22 ENCOUNTER — Ambulatory Visit
Admission: EM | Admit: 2022-11-22 | Discharge: 2022-11-22 | Disposition: A | Discharge status: Home / Self Care | Payer: Medicaid Other | Attending: Urgent Care | Admitting: Urgent Care

## 2022-11-22 DIAGNOSIS — J4530 Mild persistent asthma, uncomplicated: Secondary | ICD-10-CM

## 2022-11-22 DIAGNOSIS — J309 Allergic rhinitis, unspecified: Secondary | ICD-10-CM

## 2022-11-22 DIAGNOSIS — J0181 Other acute recurrent sinusitis: Secondary | ICD-10-CM

## 2022-11-22 MED ORDER — PROMETHAZINE-DM 6.25-15 MG/5ML PO SYRP: 5.0000 mL | ORAL_SOLUTION | Freq: Three times a day (TID) | ORAL | 0 refills | Status: AC | PRN

## 2022-11-22 MED ORDER — CEFDINIR 300 MG PO CAPS: 300.0000 mg | ORAL_CAPSULE | Freq: Two times a day (BID) | ORAL | 0 refills | Status: AC

## 2022-11-22 MED ORDER — PREDNISONE 20 MG PO TABS: 40.0000 mg | ORAL_TABLET | Freq: Every day | ORAL | 0 refills | Status: DC

## 2022-11-22 NOTE — ED Provider Notes (Addendum)
[facility]  Note:  This document was prepared using Systems analyst and may include unintentional dictation errors.  MRN: [id number] DOB: [birth year]  Subjective:   Jasmine Maynard is a 32 y.o. female presenting for 12 to 14-day history of acute onset persistent sinus congestion, sinus drainage now having right ear pain, oral pain, dental pain.  Has been using over-the-counter medications with minimal relief.  Has a history of allergic rhinitis and asthma.  Has typically responded well when she is steroids.  Reports that amoxicillin has not been effective for her in the past.  No current facility-administered medications for this encounter.  Current Outpatient Medications:    acetaminophen (TYLENOL) 325 MG tablet, Take 650 mg by mouth every 6 (six) hours as needed., Disp: , Rfl:    albuterol (PROVENTIL) (2.5 MG/3ML) 0.083% nebulizer solution, Take 3 mLs (2.5 mg total) by nebulization every 6 (six) hours as needed for wheezing or shortness of breath., Disp: 75 mL, Rfl: 1   azelastine (ASTELIN) 0.1 % nasal spray, Place into both nostrils 2 (two) times daily. Use in each nostril as directed, Disp: , Rfl:    cetirizine (ZYRTEC) 10 MG chewable tablet, Chew 10 mg by mouth daily., Disp: , Rfl:    montelukast (SINGULAIR) 10 MG tablet, Take 1 tablet (10 mg total) by mouth at bedtime., Disp: 30 tablet, Rfl: 1   sertraline (ZOLOFT) 25 MG tablet, Take 1 tablet (25 mg total) by mouth daily., Disp: 90 tablet, Rfl: 3   valACYclovir (VALTREX) 1000 MG tablet, Take 1 tablet (1,000 mg total) by mouth daily., Disp: 90 tablet, Rfl: 3   Allergies  Allergen Reactions   Amoxicillin     Reaction: INEFFECTIVE Has patient had a PCN reaction causing immediate rash, facial/tongue/throat swelling, SOB or lightheadedness with hypotension: No Has patient had a PCN reaction causing severe rash involving mucus membranes or skin necrosis: No Has patient had a PCN reaction that  required hospitalization No Has patient had a PCN reaction occurring within the last 10 years: No If all of the above answers are "NO", then may proceed with Cephalosporin use.    Avelox [Moxifloxacin Hcl In Nacl] Diarrhea and Nausea And Vomiting   Doxycycline Nausea And Vomiting   Latex Rash    Past Medical History:  Diagnosis Date   ADHD (attention deficit hyperactivity disorder)    Asthma    DDD (degenerative disc disease)    in back   Depression    fine problem   Heartburn    Herpes    Perennial allergic rhinitis    Pregnant 01/21/2015   Pregnant 01/26/2016   Vertigo      Past Surgical History:  Procedure Laterality Date   CHOLECYSTECTOMY  02/13/2012   Procedure: LAPAROSCOPIC CHOLECYSTECTOMY;  Surgeon: Donato Heinz, MD;  Location: [facility];  Service: General;  Laterality: N/A;   INTRAUTERINE DEVICE INSERTION  2011   MYRINGOTOMY  2011   right    Family History  Problem Relation Age of Onset   Diabetes Paternal Grandfather    Heart attack Paternal Grandfather    Other Paternal Grandfather        aneursym   Diabetes Paternal Grandmother    Cancer Paternal Grandmother        breast,lung   Other Maternal Grandmother        heart issues; has stent; has a eating disorder   COPD Father    Other Father        heart  issues   COPD Mother    Mental illness Sister    Autism Other     Social History   Tobacco Use   Smoking status: Never   Smokeless tobacco: Never  Vaping Use   Vaping Use: Never used  Substance Use Topics   Alcohol use: Not Currently    Alcohol/week: 2.0 standard drinks of alcohol    Types: 2 Cans of beer per week    Comment: occ   Drug use: No    ROS   Objective:   Vitals: BP (!) 143/82 (BP Location: Right Arm)   Pulse 77   Temp 98.5 F (36.9 C) (Oral)   Resp 18   LMP 10/27/2022 (Exact Date)   SpO2 98%   Physical Exam Constitutional:      General: She is not in acute distress.    Appearance: Normal appearance. She is  well-developed and normal weight. She is not ill-appearing, toxic-appearing or diaphoretic.  HENT:     Head: Normocephalic and atraumatic.     Right Ear: Ear canal and external ear normal. No drainage or tenderness. No middle ear effusion. There is no impacted cerumen. Tympanic membrane is injected. Tympanic membrane is not erythematous or bulging.     Left Ear: Tympanic membrane, ear canal and external ear normal. No drainage or tenderness.  No middle ear effusion. There is no impacted cerumen. Tympanic membrane is not injected, erythematous or bulging.     Nose: Congestion and rhinorrhea present.     Mouth/Throat:     Mouth: Mucous membranes are moist. No oral lesions.     Pharynx: Posterior oropharyngeal erythema (with post-nasal drainage) present. No pharyngeal swelling, oropharyngeal exudate or uvula swelling.     Tonsils: No tonsillar exudate or tonsillar abscesses.  Eyes:     General: No scleral icterus.       Right eye: No discharge.        Left eye: No discharge.     Extraocular Movements: Extraocular movements intact.     Right eye: Normal extraocular motion.     Left eye: Normal extraocular motion.     Conjunctiva/sclera: Conjunctivae normal.  Cardiovascular:     Rate and Rhythm: Normal rate and regular rhythm.     Heart sounds: Normal heart sounds. No murmur heard.    No friction rub. No gallop.  Pulmonary:     Effort: Pulmonary effort is normal. No respiratory distress.     Breath sounds: No stridor. No wheezing, rhonchi or rales.  Chest:     Chest wall: No tenderness.  Musculoskeletal:     Cervical back: Normal range of motion and neck supple.  Lymphadenopathy:     Cervical: No cervical adenopathy.  Skin:    General: Skin is warm and dry.  Neurological:     General: No focal deficit present.     Mental Status: She is alert and oriented to person, place, and time.  Psychiatric:        Mood and Affect: Mood normal.        Behavior: Behavior normal.     Assessment  and Plan :   PDMP not reviewed this encounter.  1. Other acute recurrent sinusitis   2. Allergic rhinitis, unspecified seasonality, unspecified trigger   3. Mild persistent asthma, uncomplicated     Deferred imaging given clear cardiopulmonary exam, hemodynamically stable vital signs. Will start empiric treatment for sinusitis with cefdinir.  In the context of her allergic rhinitis and asthma recommended a oral prednisone  course.  Maintain daily allergy medications.  Recommended supportive care otherwise. Counseled patient on potential for adverse effects with medications prescribed/recommended today, ER and return-to-clinic precautions discussed, patient verbalized understanding.     Jaynee Eagles, [place] 11/22/22 [phone]

## 2022-11-22 NOTE — ED Triage Notes (Signed)
Symptoms since 12/20.  Green nasal congestion, hard to hear out of right ear.  Pain at top of mouth that extends to right jaw.  Has been taking sudafed.

## 2022-12-02 ENCOUNTER — Ambulatory Visit
Admission: EM | Admit: 2022-12-02 | Discharge: 2022-12-02 | Disposition: A | Discharge status: Home / Self Care | Payer: Self-pay | Attending: Family Medicine | Admitting: Family Medicine

## 2022-12-02 ENCOUNTER — Telehealth: Payer: Self-pay

## 2022-12-02 ENCOUNTER — Encounter: Payer: Self-pay | Admitting: Emergency Medicine

## 2022-12-02 DIAGNOSIS — J309 Allergic rhinitis, unspecified: Secondary | ICD-10-CM

## 2022-12-02 MED ORDER — PREDNISONE 20 MG PO TABS: 40.0000 mg | ORAL_TABLET | Freq: Every day | ORAL | 0 refills | Status: AC

## 2022-12-02 MED ORDER — FLUTICASONE PROPIONATE 50 MCG/ACT NA SUSP: 1.0000 | Freq: Two times a day (BID) | NASAL | 2 refills | Status: AC

## 2022-12-02 NOTE — ED Triage Notes (Signed)
Was seen on 12/20 and was given antibiotics, states throat is still sore and still c/o right ear pain.

## 2022-12-02 NOTE — ED Provider Notes (Signed)
[facility]    CSN: [id number] Arrival date & time: 12/02/22  0809      History   Chief Complaint No chief complaint on file.   HPI Jasmine Maynard is a 32 y.o. female.   Patient presenting today with ongoing sinusitis type issues for the past month to 2 months.  She has not been on at least 1 course of antibiotics and steroids and has been consistent with her allergy regimen to include Zyrtec, Singulair, Astelin nasal spray but she states symptoms persist, particularly the pressure in her sinuses and sore, swollen feeling throat.  Denies fever, chills, chest pain, shortness of breath, abdominal pain, nausea vomiting or diarrhea.   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   GERD (gastroesophageal reflux disease) 02/09/2015   Constipation 02/09/2015   Heartburn 01/21/2015   Vertigo 01/21/2015   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   Chronic low back pain 09/17/2013   Depression with anxiety 02/11/2013   ADHD (attention deficit hyperactivity disorder) 02/11/2013   Past Surgical History:  Procedure Laterality Date   CHOLECYSTECTOMY  02/13/2012   Procedure: LAPAROSCOPIC CHOLECYSTECTOMY;  Surgeon: Fabio Bering, MD;  Location: [facility];  Service: General;  Laterality: N/A;   INTRAUTERINE DEVICE INSERTION  2011   MYRINGOTOMY  2011   right    OB History     Gravida  3   Para  3   Term  3   Preterm      AB      Living  3      SAB       IAB      Ectopic      Multiple  0   Live Births  3            Home Medications    Prior to Admission medications   Medication Sig Start Date End Date Taking? Authorizing Provider  fluticasone (FLONASE) 50 MCG/ACT nasal spray Place 1 spray into both nostrils 2 (two) times daily. 12/02/22  Yes Particia Nearing, PA-C  acetaminophen (TYLENOL) 325 MG tablet Take 650 mg by mouth every 6 (six) hours as needed.    [provider]  albuterol (PROVENTIL) (2.5 MG/3ML) 0.083% nebulizer solution Take 3 mLs (2.5 mg total) by nebulization every 6 (six) hours as needed for wheezing or shortness of breath. 05/11/21   Idol, Raynelle Fanning, PA-C  azelastine (ASTELIN) 0.1 % nasal spray Place into both nostrils 2 (two) times daily. Use in each nostril as directed    [provider]  cefdinir (OMNICEF) 300 MG capsule Take 1 capsule (300 mg total) by mouth 2 (two) times daily. 11/22/22   Wallis Bamberg, PA-C  cetirizine (ZYRTEC) 10 MG chewable tablet Chew 10 mg by mouth daily.    [provider]  montelukast (SINGULAIR) 10 MG tablet Take 1 tablet (10 mg total) by mouth at bedtime. 05/28/21   Dellis Anes,  Gwenith Daily, MD  predniSONE (DELTASONE) 20 MG tablet Take 2 tablets (40 mg total) by mouth daily with breakfast. 12/02/22   Volney American, PA-C  promethazine-dextromethorphan (PROMETHAZINE-DM) 6.25-15 MG/5ML syrup Take 5 mLs by mouth 3 (three) times daily as needed for cough. 11/22/22   Jaynee Eagles, PA-C  sertraline (ZOLOFT) 25 MG tablet Take 1 tablet (25 mg total) by mouth daily. 10/13/21   Roma Schanz, CNM  valACYclovir (VALTREX) 1000 MG tablet Take 1 tablet (1,000 mg total) by mouth daily. 10/13/21   Roma Schanz, CNM  dicyclomine (BENTYL) 20 MG tablet Take 1 tablet (20 mg total) by mouth every 6 (six) hours as needed (abdominal cramping). 01/23/12 02/09/12  Francine Graven, DO    Family History Family History  Problem Relation Age of Onset   Diabetes Paternal  Grandfather    Heart attack Paternal Grandfather    Other Paternal Grandfather        aneursym   Diabetes Paternal Grandmother    Cancer Paternal Grandmother        breast,lung   Other Maternal Grandmother        heart issues; has stent; has a eating disorder   COPD Father    Other Father        heart issues   COPD Mother    Mental illness Sister    Autism Other     Social History Social History   Tobacco Use   Smoking status: Never   Smokeless tobacco: Never  Vaping Use   Vaping Use: Never used  Substance Use Topics   Alcohol use: Not Currently    Alcohol/week: 2.0 standard drinks of alcohol    Types: 2 Cans of beer per week    Comment: occ   Drug use: No     Allergies   Amoxicillin, Avelox [moxifloxacin hcl in nacl], Doxycycline, and Latex   Review of Systems Review of Systems Per HPI  Physical Exam Triage Vital Signs ED Triage Vitals  Enc Vitals Group     BP 12/02/22 0817 109/70     Pulse Rate 12/02/22 0817 83     Resp 12/02/22 0817 18     Temp 12/02/22 0817 99 F (37.2 C)     Temp Source 12/02/22 0817 Oral     SpO2 12/02/22 0817 97 %     Weight --      Height --      Head Circumference --      Peak Flow --      Pain Score 12/02/22 0820 5     Pain Loc --      Pain Edu? --      Excl. in [place]? --    No data found.  Updated Vital Signs BP 109/70 (BP Location: Right Arm)   Pulse 83   Temp 99 F (37.2 C) (Oral)   Resp 18   LMP 10/27/2022 (Exact Date)   SpO2 97%   Visual Acuity Right Eye Distance:   Left Eye Distance:   Bilateral Distance:    Right Eye Near:   Left Eye Near:    Bilateral Near:     Physical Exam Vitals and nursing note reviewed.  Constitutional:      Appearance: Normal appearance.  HENT:     Head: Atraumatic.     Right Ear: Tympanic membrane and external ear normal.     Left Ear: Tympanic membrane and external ear normal.     Nose:     Comments: Bilateral  nasal turbinates boggy, erythematous    Mouth/Throat:      Mouth: Mucous membranes are moist.     Pharynx: Posterior oropharyngeal erythema present. No oropharyngeal exudate.     Comments: Uvula and bilateral tonsils injected Eyes:     Extraocular Movements: Extraocular movements intact.     Conjunctiva/sclera: Conjunctivae normal.  Cardiovascular:     Rate and Rhythm: Normal rate and regular rhythm.     Heart sounds: Normal heart sounds.  Pulmonary:     Effort: Pulmonary effort is normal.     Breath sounds: Normal breath sounds. No wheezing or rales.  Musculoskeletal:        General: Normal range of motion.     Cervical back: Normal range of motion and neck supple.  Skin:    General: Skin is warm and dry.  Neurological:     Mental Status: She is alert and oriented to person, place, and time.  Psychiatric:        Mood and Affect: Mood normal.        Thought Content: Thought content normal.      UC Treatments / Results  Labs (all labs ordered are listed, but only abnormal results are displayed) Labs Reviewed - No data to display  EKG   Radiology No results found.  Procedures Procedures (including critical care time)  Medications Ordered in UC Medications - No data to display  Initial Impression / Assessment and Plan / UC Course  I have reviewed the triage vital signs and the nursing notes.  Pertinent labs & imaging results that were available during my care of the patient were reviewed by me and considered in my medical decision making (see chart for details).     Consistent with ongoing inflammatory/allergic symptoms.  No evidence of a bacterial infection today, will treat with prednisone, Flonase in addition to her typical allergy regimen.  Return for worsening symptoms.  Final Clinical Impressions(s) / UC Diagnoses   Final diagnoses:  Allergic sinusitis   Discharge Instructions   None    ED Prescriptions     Medication Sig Dispense Auth. Provider   predniSONE (DELTASONE) 20 MG tablet Take 2 tablets (40 mg  total) by mouth daily with breakfast. 10 tablet Volney American, PA-C   fluticasone [facility]) 50 MCG/ACT nasal spray Place 1 spray into both nostrils 2 (two) times daily. 16 g Volney American, [place]      PDMP not reviewed this encounter.   Volney American, [place] 12/02/22 1007

## 2022-12-02 NOTE — Telephone Encounter (Signed)
Patient called to see if she could get a self pay estimate for allergy shots and a office visit. Patient doesn't have insurance and wants to start allergy shots, but she needs a visit as we have not seen her since 2022. [facility]edy could you mail something out to her. Address/phone number on file is correct.   Thanks

## 2023-03-15 IMAGING — DX DG CHEST 2V
2 series · 2 of 2 positions shown · non-contrast
Comparison: None.

CLINICAL DATA: Persistent cough, chest pain

EXAM:
CHEST - 2 VIEW

[chest pa]
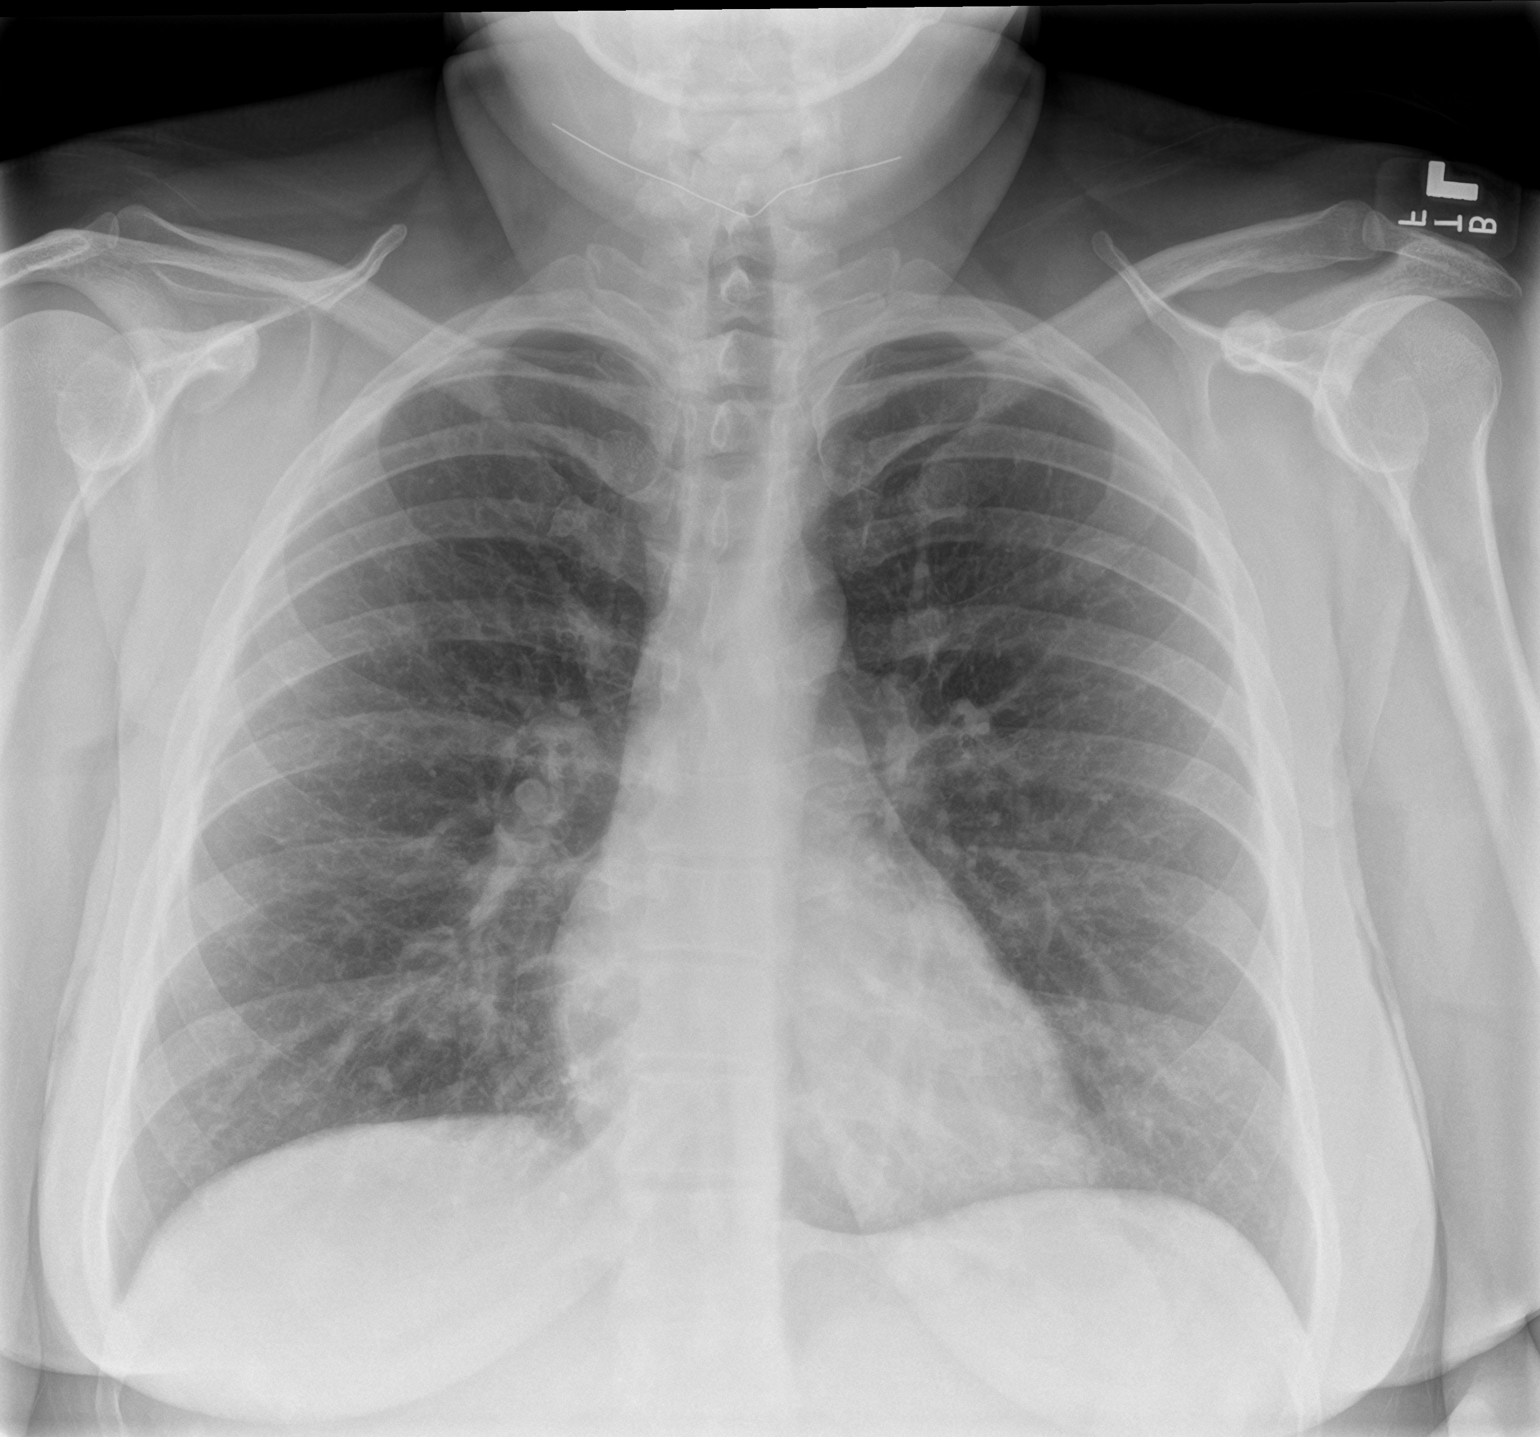

[chest lat]
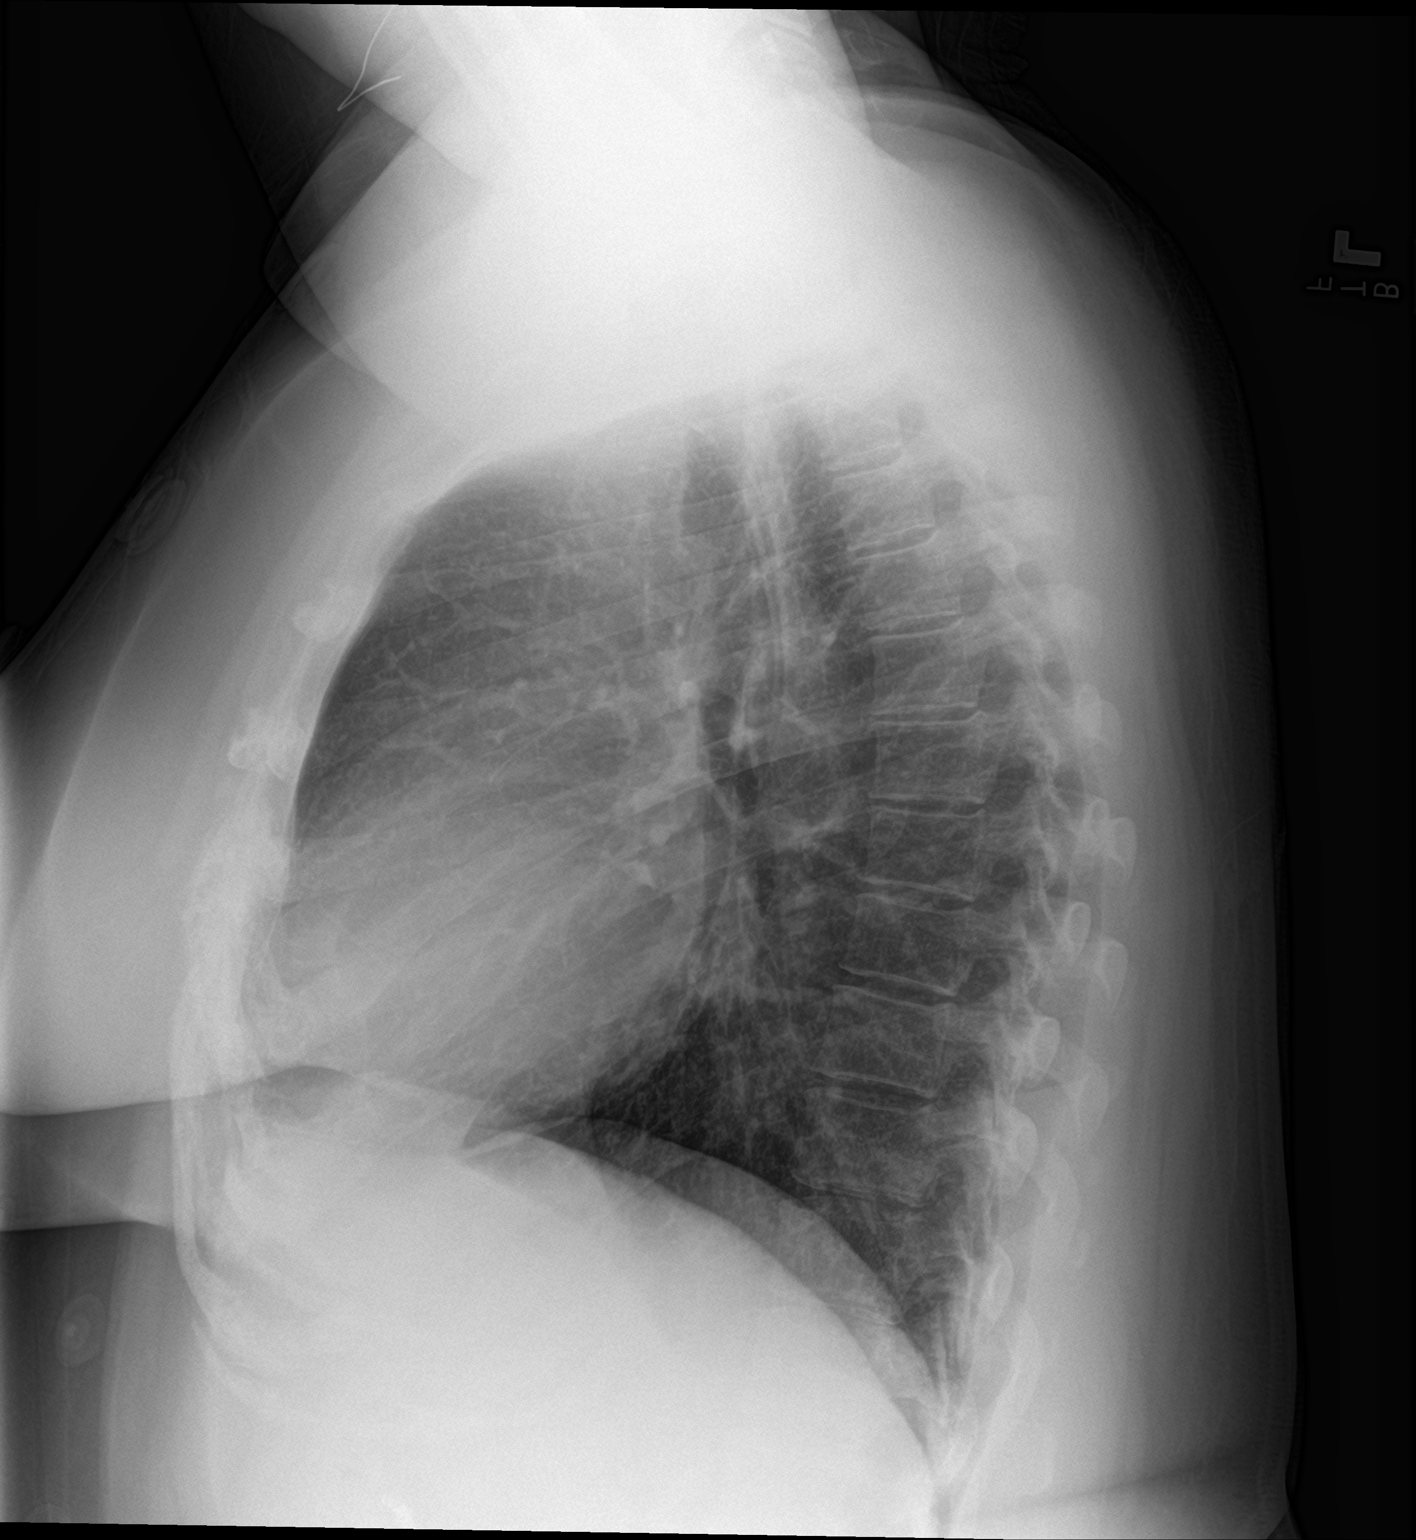

[2 of 2 positions shown; findings below may reference images not displayed]

FINDINGS: The heart size and mediastinal contours are within normal limits.
Both lungs are clear. The visualized skeletal structures are
unremarkable.
IMPRESSION: No acute abnormality of the lungs.

## 2024-06-13 ENCOUNTER — Ambulatory Visit (INDEPENDENT_AMBULATORY_CARE_PROVIDER_SITE_OTHER): Payer: Self-pay

## 2024-06-13 ENCOUNTER — Other Ambulatory Visit (HOSPITAL_COMMUNITY)
Admission: RE | Admit: 2024-06-13 | Discharge: 2024-06-13 | Disposition: A | Discharge status: Home / Self Care | Payer: Self-pay | Source: Ambulatory Visit | Attending: Obstetrics & Gynecology | Admitting: Obstetrics & Gynecology

## 2024-06-13 DIAGNOSIS — N898 Other specified noninflammatory disorders of vagina: Secondary | ICD-10-CM

## 2024-06-13 NOTE — Progress Notes (Signed)
   NURSE VISIT- VAGINITIS  SUBJECTIVE:  Jasmine Maynard is a 33 y.o. [id number] GYN patientfemale here for a vaginal swab for vaginitis screening.  She reports the following symptoms: local irritation for 4 days. Denies abnormal vaginal bleeding, significant pelvic pain, fever, or UTI symptoms. Denies changes in sexual partners. Tried over the counter partners and it made the area red and angry.   OBJECTIVE:  There were no vitals taken for this visit.  Appears well, in no apparent distress  ASSESSMENT: Vaginal swab for vaginitis screening  PLAN: Self-collected vaginal probe for Bacterial Vaginosis, Yeast sent to lab Treatment: to be determined once results are received Follow-up as needed if symptoms persist/worsen, or new symptoms develop  Jasmine Maynard  06/13/2024 2:14 PM

## 2024-06-14 ENCOUNTER — Encounter: Payer: Self-pay | Admitting: Women's Health

## 2024-06-17 LAB — CERVICOVAGINAL ANCILLARY ONLY
Bacterial Vaginitis (gardnerella): NEGATIVE
Candida Glabrata: NEGATIVE
Candida Vaginitis: POSITIVE — AB
Comment: NEGATIVE
Comment: NEGATIVE
Comment: NEGATIVE

## 2024-06-18 ENCOUNTER — Ambulatory Visit: Payer: Self-pay | Admitting: Women's Health

## 2024-06-18 MED ORDER — FLUCONAZOLE 150 MG PO TABS: 150.0000 mg | ORAL_TABLET | Freq: Once | ORAL | 0 refills | Status: AC
